# Patient Record
Sex: Female | Born: 1960 | Race: Black or African American | Hispanic: No | Marital: Single | Smoking: Never smoker
Health system: Southern US, Community
[De-identification: ages and names within clinical notes are randomized; demographics above are authoritative.]

## PROBLEM LIST (undated history)

## (undated) DIAGNOSIS — M1990 Unspecified osteoarthritis, unspecified site: Secondary | ICD-10-CM

## (undated) DIAGNOSIS — I8390 Asymptomatic varicose veins of unspecified lower extremity: Secondary | ICD-10-CM

## (undated) DIAGNOSIS — G43909 Migraine, unspecified, not intractable, without status migrainosus: Secondary | ICD-10-CM

## (undated) DIAGNOSIS — G8929 Other chronic pain: Secondary | ICD-10-CM

## (undated) DIAGNOSIS — N200 Calculus of kidney: Secondary | ICD-10-CM

## (undated) DIAGNOSIS — I10 Essential (primary) hypertension: Secondary | ICD-10-CM

## (undated) DIAGNOSIS — R51 Headache: Secondary | ICD-10-CM

## (undated) DIAGNOSIS — E785 Hyperlipidemia, unspecified: Secondary | ICD-10-CM

## (undated) DIAGNOSIS — K219 Gastro-esophageal reflux disease without esophagitis: Secondary | ICD-10-CM

## (undated) DIAGNOSIS — R519 Headache, unspecified: Secondary | ICD-10-CM

## (undated) DIAGNOSIS — T8859XA Other complications of anesthesia, initial encounter: Secondary | ICD-10-CM

## (undated) HISTORY — PX: OTHER SURGICAL HISTORY: SHX169

## (undated) HISTORY — PX: REPLACEMENT TOTAL KNEE: SUR1224

## (undated) HISTORY — PX: WRIST SURGERY: SHX841

## (undated) HISTORY — DX: Asymptomatic varicose veins of unspecified lower extremity: I83.90

## (undated) HISTORY — PX: LITHOTRIPSY: SUR834

## (undated) HISTORY — DX: Calculus of kidney: N20.0

## (undated) HISTORY — PX: TONSILLECTOMY: SUR1361

## (undated) HISTORY — DX: Unspecified osteoarthritis, unspecified site: M19.90

## (undated) HISTORY — PX: BREAST BIOPSY: SHX20

## (undated) HISTORY — DX: Essential (primary) hypertension: I10

## (undated) HISTORY — DX: Other complications of anesthesia, initial encounter: T88.59XA

## (undated) HISTORY — DX: Gastro-esophageal reflux disease without esophagitis: K21.9

## (undated) HISTORY — PX: ROTATOR CUFF REPAIR: SHX139

## (undated) HISTORY — DX: Hyperlipidemia, unspecified: E78.5

## (undated) HISTORY — PX: ABDOMINAL HYSTERECTOMY: SHX81

---

## 2007-10-18 ENCOUNTER — Emergency Department (HOSPITAL_COMMUNITY): Admission: EM | Admit: 2007-10-18 | Discharge: 2007-10-18 | Payer: Self-pay | Admitting: Emergency Medicine

## 2007-10-25 ENCOUNTER — Emergency Department (HOSPITAL_COMMUNITY): Admission: EM | Admit: 2007-10-25 | Discharge: 2007-10-25 | Payer: Self-pay | Admitting: Emergency Medicine

## 2008-02-05 ENCOUNTER — Emergency Department (HOSPITAL_COMMUNITY): Admission: EM | Admit: 2008-02-05 | Discharge: 2008-02-05 | Payer: Self-pay | Admitting: Emergency Medicine

## 2008-03-22 ENCOUNTER — Emergency Department (HOSPITAL_COMMUNITY): Admission: EM | Admit: 2008-03-22 | Discharge: 2008-03-22 | Payer: Self-pay | Admitting: Emergency Medicine

## 2008-03-31 ENCOUNTER — Ambulatory Visit: Payer: Self-pay | Admitting: Family Medicine

## 2008-03-31 DIAGNOSIS — E119 Type 2 diabetes mellitus without complications: Secondary | ICD-10-CM | POA: Insufficient documentation

## 2008-03-31 DIAGNOSIS — I10 Essential (primary) hypertension: Secondary | ICD-10-CM | POA: Insufficient documentation

## 2008-03-31 DIAGNOSIS — E785 Hyperlipidemia, unspecified: Secondary | ICD-10-CM | POA: Insufficient documentation

## 2008-04-03 ENCOUNTER — Ambulatory Visit: Payer: Self-pay | Admitting: Family Medicine

## 2008-04-03 LAB — CONVERTED CEMR LAB: Hgb A1c MFr Bld: 7.5 %

## 2008-05-05 ENCOUNTER — Ambulatory Visit: Payer: Self-pay | Admitting: Family Medicine

## 2008-06-12 ENCOUNTER — Ambulatory Visit: Payer: Self-pay | Admitting: Family Medicine

## 2008-07-03 ENCOUNTER — Ambulatory Visit: Payer: Self-pay | Admitting: Sports Medicine

## 2008-07-04 ENCOUNTER — Telehealth (INDEPENDENT_AMBULATORY_CARE_PROVIDER_SITE_OTHER): Payer: Self-pay | Admitting: *Deleted

## 2008-07-07 ENCOUNTER — Encounter (INDEPENDENT_AMBULATORY_CARE_PROVIDER_SITE_OTHER): Payer: Self-pay | Admitting: *Deleted

## 2008-07-09 ENCOUNTER — Encounter: Payer: Self-pay | Admitting: Family Medicine

## 2008-07-17 ENCOUNTER — Ambulatory Visit: Payer: Self-pay | Admitting: Family Medicine

## 2008-07-17 LAB — CONVERTED CEMR LAB: Hgb A1c MFr Bld: 7.7 %

## 2008-08-07 ENCOUNTER — Ambulatory Visit: Payer: Self-pay | Admitting: Sports Medicine

## 2008-08-07 DIAGNOSIS — M25569 Pain in unspecified knee: Secondary | ICD-10-CM | POA: Insufficient documentation

## 2008-08-08 ENCOUNTER — Ambulatory Visit: Payer: Self-pay | Admitting: Family Medicine

## 2008-08-08 ENCOUNTER — Encounter: Payer: Self-pay | Admitting: Family Medicine

## 2008-08-08 LAB — CONVERTED CEMR LAB
CO2: 23 meq/L (ref 19–32)
Glucose, Bld: 115 mg/dL — ABNORMAL HIGH (ref 70–99)
Potassium: 3.9 meq/L (ref 3.5–5.3)
RBC: 4.68 M/uL (ref 3.87–5.11)
Sodium: 142 meq/L (ref 135–145)
Total CHOL/HDL Ratio: 5.5
VLDL: 23 mg/dL (ref 0–40)
WBC: 9.6 10*3/uL (ref 4.0–10.5)

## 2008-08-12 ENCOUNTER — Encounter: Payer: Self-pay | Admitting: Family Medicine

## 2008-08-15 ENCOUNTER — Ambulatory Visit (HOSPITAL_COMMUNITY): Admission: RE | Admit: 2008-08-15 | Discharge: 2008-08-15 | Payer: Self-pay | Admitting: Family Medicine

## 2008-11-06 ENCOUNTER — Ambulatory Visit: Payer: Self-pay | Admitting: Family Medicine

## 2008-12-15 ENCOUNTER — Ambulatory Visit: Payer: Self-pay | Admitting: Family Medicine

## 2008-12-15 LAB — CONVERTED CEMR LAB: Hgb A1c MFr Bld: 7.5 %

## 2009-02-02 ENCOUNTER — Ambulatory Visit: Payer: Self-pay | Admitting: Family Medicine

## 2009-02-02 ENCOUNTER — Encounter: Payer: Self-pay | Admitting: Family Medicine

## 2009-02-02 ENCOUNTER — Ambulatory Visit (HOSPITAL_COMMUNITY): Admission: RE | Admit: 2009-02-02 | Discharge: 2009-02-02 | Payer: Self-pay | Admitting: Family Medicine

## 2009-02-02 LAB — CONVERTED CEMR LAB
ALT: 8 units/L (ref 0–35)
Alkaline Phosphatase: 74 units/L (ref 39–117)
Sodium: 147 meq/L — ABNORMAL HIGH (ref 135–145)
Total Bilirubin: 0.4 mg/dL (ref 0.3–1.2)
Total Protein: 7.2 g/dL (ref 6.0–8.3)

## 2009-02-05 ENCOUNTER — Encounter: Payer: Self-pay | Admitting: Family Medicine

## 2009-02-05 ENCOUNTER — Ambulatory Visit (HOSPITAL_COMMUNITY): Admission: RE | Admit: 2009-02-05 | Discharge: 2009-02-05 | Payer: Self-pay | Admitting: Family Medicine

## 2009-02-06 ENCOUNTER — Encounter: Payer: Self-pay | Admitting: Family Medicine

## 2009-02-06 ENCOUNTER — Telehealth: Payer: Self-pay | Admitting: Family Medicine

## 2009-02-11 ENCOUNTER — Ambulatory Visit: Payer: Self-pay | Admitting: Family Medicine

## 2009-02-11 DIAGNOSIS — I2789 Other specified pulmonary heart diseases: Secondary | ICD-10-CM | POA: Insufficient documentation

## 2009-03-03 ENCOUNTER — Encounter: Payer: Self-pay | Admitting: Family Medicine

## 2009-03-31 ENCOUNTER — Encounter: Payer: Self-pay | Admitting: Family Medicine

## 2009-03-31 ENCOUNTER — Ambulatory Visit: Payer: Self-pay | Admitting: Family Medicine

## 2009-03-31 LAB — CONVERTED CEMR LAB
Alkaline Phosphatase: 98 units/L (ref 39–117)
Creatinine, Ser: 0.75 mg/dL (ref 0.40–1.20)
Glucose, Bld: 310 mg/dL — ABNORMAL HIGH (ref 70–99)
MCHC: 31.8 g/dL (ref 30.0–36.0)
MCV: 83.2 fL (ref 78.0–100.0)
Platelets: 363 10*3/uL (ref 150–400)
RBC: 4.65 M/uL (ref 3.87–5.11)
Sodium: 140 meq/L (ref 135–145)
Total Bilirubin: 0.4 mg/dL (ref 0.3–1.2)
Total Protein: 6.9 g/dL (ref 6.0–8.3)

## 2009-04-05 ENCOUNTER — Encounter: Payer: Self-pay | Admitting: Family Medicine

## 2009-05-02 ENCOUNTER — Emergency Department (HOSPITAL_COMMUNITY): Admission: EM | Admit: 2009-05-02 | Discharge: 2009-05-03 | Payer: Self-pay | Admitting: Emergency Medicine

## 2009-05-04 ENCOUNTER — Encounter: Payer: Self-pay | Admitting: Family Medicine

## 2009-05-04 ENCOUNTER — Ambulatory Visit: Payer: Self-pay | Admitting: Family Medicine

## 2009-05-04 LAB — CONVERTED CEMR LAB
ALT: 29 units/L (ref 0–35)
AST: 28 units/L (ref 0–37)
CO2: 26 meq/L (ref 19–32)
Chloride: 102 meq/L (ref 96–112)
Glucose, Bld: 359 mg/dL — ABNORMAL HIGH (ref 70–99)
HDL: 29 mg/dL — ABNORMAL LOW (ref >39)
LDL Cholesterol: 45 mg/dL (ref 0–99)
Sodium: 140 meq/L (ref 135–145)
Total Bilirubin: 0.6 mg/dL (ref 0.3–1.2)
Total CHOL/HDL Ratio: 3.4

## 2009-05-27 ENCOUNTER — Encounter: Admission: RE | Admit: 2009-05-27 | Discharge: 2009-05-27 | Payer: Self-pay | Admitting: Family Medicine

## 2009-06-02 ENCOUNTER — Ambulatory Visit: Payer: Self-pay | Admitting: Family Medicine

## 2009-09-04 ENCOUNTER — Ambulatory Visit (HOSPITAL_COMMUNITY): Admission: RE | Admit: 2009-09-04 | Discharge: 2009-09-04 | Payer: Self-pay | Admitting: Family Medicine

## 2009-09-23 ENCOUNTER — Emergency Department (HOSPITAL_COMMUNITY): Admission: EM | Admit: 2009-09-23 | Discharge: 2009-09-23 | Payer: Self-pay | Admitting: Emergency Medicine

## 2009-09-25 ENCOUNTER — Ambulatory Visit: Payer: Self-pay | Admitting: Family Medicine

## 2009-09-25 DIAGNOSIS — N200 Calculus of kidney: Secondary | ICD-10-CM | POA: Insufficient documentation

## 2009-09-25 LAB — CONVERTED CEMR LAB
Glucose, Urine, Semiquant: NEGATIVE
Specific Gravity, Urine: 1.025
pH: 6.5

## 2009-10-01 ENCOUNTER — Ambulatory Visit: Payer: Self-pay | Admitting: Family Medicine

## 2009-10-01 ENCOUNTER — Encounter: Payer: Self-pay | Admitting: Family Medicine

## 2009-10-01 DIAGNOSIS — E876 Hypokalemia: Secondary | ICD-10-CM | POA: Insufficient documentation

## 2009-10-01 LAB — CONVERTED CEMR LAB
BUN: 14 mg/dL (ref 6–23)
CO2: 23 meq/L (ref 19–32)
Chloride: 103 meq/L (ref 96–112)
Glucose, Bld: 199 mg/dL — ABNORMAL HIGH (ref 70–99)
Potassium: 3.5 meq/L (ref 3.5–5.3)

## 2009-10-02 ENCOUNTER — Encounter: Payer: Self-pay | Admitting: Family Medicine

## 2009-11-05 ENCOUNTER — Observation Stay (HOSPITAL_COMMUNITY): Admission: EM | Admit: 2009-11-05 | Discharge: 2009-11-05 | Payer: Self-pay | Admitting: Emergency Medicine

## 2009-11-11 ENCOUNTER — Ambulatory Visit: Payer: Self-pay | Admitting: Family Medicine

## 2009-11-11 ENCOUNTER — Encounter: Payer: Self-pay | Admitting: Family Medicine

## 2009-11-11 LAB — CONVERTED CEMR LAB
CO2: 25 meq/L (ref 19–32)
Calcium: 8.8 mg/dL (ref 8.4–10.5)
Glucose, Bld: 241 mg/dL — ABNORMAL HIGH (ref 70–99)
Sodium: 141 meq/L (ref 135–145)

## 2009-12-04 ENCOUNTER — Encounter: Payer: Self-pay | Admitting: Family Medicine

## 2009-12-18 ENCOUNTER — Ambulatory Visit: Payer: Self-pay | Admitting: Family Medicine

## 2010-01-12 ENCOUNTER — Emergency Department (HOSPITAL_COMMUNITY)
Admission: EM | Admit: 2010-01-12 | Discharge: 2010-01-12 | Payer: Self-pay | Source: Home / Self Care | Admitting: Emergency Medicine

## 2010-02-05 ENCOUNTER — Ambulatory Visit: Admission: RE | Admit: 2010-02-05 | Discharge: 2010-02-05 | Payer: Self-pay | Source: Home / Self Care

## 2010-02-05 DIAGNOSIS — J069 Acute upper respiratory infection, unspecified: Secondary | ICD-10-CM | POA: Insufficient documentation

## 2010-02-05 DIAGNOSIS — E1140 Type 2 diabetes mellitus with diabetic neuropathy, unspecified: Secondary | ICD-10-CM | POA: Insufficient documentation

## 2010-03-02 NOTE — Assessment & Plan Note (Signed)
Summary: kidney stones,tcb   Vital Signs:  Patient profile:   50 year old female Height:      68 inches Weight:      293 pounds BMI:     44.71 BSA:     2.41 Temp:     98.3 degrees F Pulse rate:   74 / minute BP sitting:   150 / 115  Vitals Entered By: Jone Baseman CMA (September 25, 2009 10:45 AM) CC: Kidney Stones Is Patient Diabetic? No Pain Assessment Patient in pain? yes     Location: back Intensity: 10   Primary Care Provider:  Angelena Sole MD  CC:  Kidney Stones.  History of Present Illness: 1. Kidney Stones:  Pt see in ED two days ago because of severe flank pain.  Diagnosed with right sided kidney stone and UTI.  She was given some pain meds, anti-nausea medicine, and an antibiotic and sent home.  She is still having flank pain.  She has not been taking any of the medicines because she doesn't like the way it makes her feel.  The pain is in the right flank.  It is rated a 10/10.  Pain medicines make it feel better.  She has been drinking lots of water.  She has been producing a lot of urine.    PMHx: Hx of recurrent kidney stones.  Has been seen by Urology before and has require lithotripsy in the past.  She claims that she had around 30 stones before and she has never passed a stone.  ROS: endorses dysuria and n/v.  Denies fevers, chills, hematuria   2. HTN:  She is taking her medicines as prescribed.  She did not take her BP pills this morning.  She is in pain.  Needs refills on her medications.  ROS: denies chest pain, vision changes, shortness of breath  Habits & Providers  Alcohol-Tobacco-Diet     Tobacco Status: never  Current Medications (verified): 1)  Toprol Xl 200 Mg Xr24h-Tab (Metoprolol Succinate) .Marland Kitchen.. 1 Tablet By Mouth Once Daily 2)  Accupril 40 Mg Tabs (Quinapril Hcl) .Marland Kitchen.. 1 Tablet 1 Time Per Day 3)  Metformin Hcl 1000 Mg Tabs (Metformin Hcl) .... One Two Times A Day 4)  Klor-Con 20 Meq Pack (Potassium Chloride) .Marland Kitchen.. 1 Tab By Mouth Daily 5)   Tramadol Hcl 50 Mg Tabs (Tramadol Hcl) .... Take 1 Tab Every 6 Hours As Needed For Pain 6)  Ibuprofen 600 Mg Tabs (Ibuprofen) .Marland Kitchen.. 1 Tab By Mouth At The Onset of A Headache. 7)  Zomig 2.5 Mg Tabs (Zolmitriptan) .... Take 1 Tab By Mouth Daily As Needed For Migraine 8)  Furosemide 40 Mg Tabs (Furosemide) .Marland Kitchen.. 1 Tab By Mouth Two Times A Day For Lower Extremity Edema 9)  Glimepiride 2 Mg Tabs (Glimepiride) .... One Tab in Am 10)  V-4 High Compression Hose  Misc (Elastic Bandages & Supports) .... Use As Directed 11)  [name] 32 Mg Tabs (Candesartan Cilexetil) .Marland Kitchen.. 1 Tab By Mouth Daily 12)  Crestor 10 Mg Tabs (Rosuvastatin Calcium) .Marland Kitchen.. 1 Tab By Mouth Daily  Allergies: No Known Drug Allergies  Past History:  Past Medical History: Reviewed history from 02/11/2009 and no changes required. Diabetes mellitus, type II Hyperlipidemia Hypertension Right knee pain Kidney stones Lower extremity swelling  Social History: Reviewed history from 06/02/2009 and no changes required. Moved to [place] from [facility] 09/2007. 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  Enjoys  reading and bowling.   Physical Exam  General:  VS reviewed. alert, well-developed, well-nourished, and well-hydrated.  not ill or uncomfortable appearing, in NAD Mouth:  MMM Lungs:  normal respiratory effort, normal breath sounds, no fremitus, no crackles, and no wheezes.   Heart:  Normal rate and regular rhythm. S1 and S2 normal without gallop, murmur, click, rub or other extra sounds. Abdomen:  soft, non-tender, normal bowel sounds, no distention, no guarding, and no rebound tenderness.   Msk:  Right flank is moderately tender to palpation Additional Exam:  CT abdomen / pelvis:  4mm obstructing right ureteral stone UA: + blood, small LE, 7-10WBCs, TNTC RBCs   Impression & Recommendations:  Problem # 1:  NEPHROLITHIASIS, RECURRENT (ICD-592.0) Assessment  New Obstructing stone in right ureter.  She claims that she has never passed a stone.  Will refer to Urology for evaluation and management.  She is not taking any pain meds. Orders: Urology Referral (Urology) [facility]- Est  Level 4 ([address])  Problem # 2:  HYPERTENSION (ICD-401.9) Assessment: Unchanged  Did not take her medicines this morning plus she is in pain.  Will not make any changes today. The following medications were removed from the medication list:    [name] 32 Mg Tabs (Candesartan cilexetil) .Marland Kitchen... 1 tablet by mouth once daily Her updated medication list for this problem includes:    Toprol Xl 200 Mg Xr24h-tab (Metoprolol succinate) .Marland Kitchen... 1 tablet by mouth once daily    Accupril 40 Mg Tabs (Quinapril hcl) .Marland Kitchen... 1 tablet 1 time per day    Furosemide 40 Mg Tabs (Furosemide) .Marland Kitchen... 1 tab by mouth two times a day for lower extremity edema    [name] 32 Mg Tabs (Candesartan cilexetil) .Marland Kitchen... 1 tab by mouth daily  Orders: [facility]- Est  Level 4 ([address])  Complete Medication List: 1)  Toprol Xl 200 Mg Xr24h-tab (Metoprolol succinate) .Marland Kitchen.. 1 tablet by mouth once daily 2)  Accupril 40 Mg Tabs (Quinapril hcl) .Marland Kitchen.. 1 tablet 1 time per day 3)  Metformin Hcl 1000 Mg Tabs (Metformin hcl) .... One two times a day 4)  Klor-con 20 Meq Pack (Potassium chloride) .Marland Kitchen.. 1 tab by mouth daily 5)  Tramadol Hcl 50 Mg Tabs (Tramadol hcl) .... Take 1 tab every 6 hours as needed for pain 6)  Ibuprofen 600 Mg Tabs (Ibuprofen) .Marland Kitchen.. 1 tab by mouth at the onset of a headache. 7)  Zomig 2.5 Mg Tabs (Zolmitriptan) .... Take 1 tab by mouth daily as needed for migraine 8)  Furosemide 40 Mg Tabs (Furosemide) .Marland Kitchen.. 1 tab by mouth two times a day for lower extremity edema 9)  Glimepiride 2 Mg Tabs (Glimepiride) .... One tab in am 10)  V-4 High Compression Hose Misc (Elastic bandages & supports) .... Use as directed 11)  [name] 32 Mg Tabs (Candesartan cilexetil) .Marland Kitchen.. 1 tab by mouth daily 12)  Crestor 10 Mg Tabs (Rosuvastatin  calcium) .Marland Kitchen.. 1 tab by mouth daily  Other Orders: Urinalysis-[facility] (00000)  Patient Instructions: 1)  We will get you set up to see a Urologist 2)  Take the pain medicines if you need to 3)  Please schedule a follow up appointment with me in 1 week Prescriptions: GLIMEPIRIDE 2 MG TABS (GLIMEPIRIDE) one tab in AM  #90 x 3   Entered and Authorized by:   Angelena Sole MD   Signed by:   Angelena Sole MD on 09/25/2009   Method used:   Print then Give to Patient   RxID:  [id number] KLOR-CON 20 MEQ PACK (POTASSIUM CHLORIDE) 1 tab by mouth daily  #90 x 3   Entered and Authorized by:   Angelena Sole MD   Signed by:   Angelena Sole MD on 09/25/2009   Method used:   Print then Give to Patient   RxID:   [id number] METFORMIN HCL 1000 MG TABS (METFORMIN HCL) one two times a day  #180 x 6   Entered and Authorized by:   Angelena Sole MD   Signed by:   Angelena Sole MD on 09/25/2009   Method used:   Print then Give to Patient   RxID:   [id number] ACCUPRIL 40 MG TABS (QUINAPRIL HCL) 1 tablet 1 time per day  #90 x 3   Entered and Authorized by:   Angelena Sole MD   Signed by:   Angelena Sole MD on 09/25/2009   Method used:   Print then Give to Patient   RxID:   [id number] TOPROL XL 200 MG XR24H-TAB (METOPROLOL SUCCINATE) 1 tablet by mouth once daily  #90 x 3   Entered and Authorized by:   Angelena Sole MD   Signed by:   Angelena Sole MD on 09/25/2009   Method used:   Print then Give to Patient   RxID:   [id number]   Laboratory Results   Urine Tests  Date/Time Received: September 25, 2009 10:46 AM  Date/Time Reported: September 25, 2009 11:16 AM   Routine Urinalysis   Color: yellow Appearance: slightly Cloudy Glucose: negative   (Normal Range: Negative) Bilirubin: negative   (Normal Range: Negative) Ketone: negative   (Normal Range: Negative) Spec. Gravity: 1.025   (Normal Range: 1.003-1.035) Blood: moderate   (Normal Range: Negative) pH:  6.5   (Normal Range: 5.0-8.0) Protein: 100   (Normal Range: Negative) Urobilinogen: 0.2   (Normal Range: 0-1) Nitrite: negative   (Normal Range: Negative) Leukocyte Esterace: moderate   (Normal Range: Negative)  Urine Microscopic WBC/HPF: loaded RBC/HPF: >20 Bacteria/HPF: 3+ Epithelial/HPF: 10-20 Yeast/HPF: mod with hyphae    Comments: ...............test performed by......Marland KitchenBonnie A. [place], MLS (ASCP)cm

## 2010-03-02 NOTE — Assessment & Plan Note (Signed)
Summary: f/u eo   Vital Signs:  Patient profile:   50 year old female Height:      68 inches Weight:      299.06 pounds BMI:     45.64 BSA:     2.43 Temp:     98.1 degrees F Pulse rate:   81 / minute BP sitting:   150 / 107  Vitals Entered By: Jone Baseman CMA (November 11, 2009 8:55 AM) CC: f/u kidney stones Is Patient Diabetic? Yes Did you bring your meter with you today? No Pain Assessment Patient in pain? yes     Location: flank Intensity: 7   Primary Care Provider:  Angelena Sole MD  CC:  f/u kidney stones.  History of Present Illness: 1. F/U Kidney stones:  Pt has had kidney stones for about 1 month.  She wasn't able to afford the Urology referral.  She went to the ED last week becaus of severe pain and diagnosed with bilateral kidney stones.  She is still having bad pain.  Pain is rated a 7/10.  Located more on the right flank but also goes to the left.    ROS: denies hematuria, fever  2. HTN: she is taking her medicines as prescribed.  She thinks that it is elevated because she is in pain.  Would not want to think about making any medication adjustments until her kidney stones have been figured out.  ROS: denies chest pain, shortness of breath    Habits & Providers  Alcohol-Tobacco-Diet     Tobacco Status: never  Current Medications (verified): 1)  Toprol Xl 200 Mg Xr24h-Tab (Metoprolol Succinate) .Marland Kitchen.. 1 Tablet By Mouth Once Daily 2)  Accupril 40 Mg Tabs (Quinapril Hcl) .Marland Kitchen.. 1 Tablet 1 Time Per Day 3)  Metformin Hcl 1000 Mg Tabs (Metformin Hcl) .... One Two Times A Day 4)  Klor-Con 20 Meq Pack (Potassium Chloride) .Marland Kitchen.. 1 Tab By Mouth Daily 5)  Ibuprofen 600 Mg Tabs (Ibuprofen) .Marland Kitchen.. 1 Tab By Mouth At The Onset of A Headache. 6)  Zomig 2.5 Mg Tabs (Zolmitriptan) .... Take 1 Tab By Mouth Daily As Needed For Migraine 7)  Glimepiride 2 Mg Tabs (Glimepiride) .... One Tab in Am 8)  [name] 32 Mg Tabs (Candesartan Cilexetil) .Marland Kitchen.. 1 Tab By Mouth Daily 9)   Crestor 10 Mg Tabs (Rosuvastatin Calcium) .Marland Kitchen.. 1 Tab By Mouth Daily 10)  Hydrochlorothiazide 25 Mg Tabs (Hydrochlorothiazide) .Marland Kitchen.. 1 Tab By Mouth Daily  Allergies: No Known Drug Allergies  Social History: Reviewed history from 06/02/2009 and no changes required. Moved to [place] from [facility] 09/2007. Has not worked in about 1 year.  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  Enjoys reading and bowling.   Physical Exam  General:  VS reviewed. alert, well-developed, well-nourished, and well-hydrated.  not ill or uncomfortable appearing, in NAD Lungs:  normal respiratory effort, normal breath sounds, no fremitus, no crackles, and no wheezes.   Heart:  Normal rate and regular rhythm. S1 and S2 normal without gallop, murmur, click, rub or other extra sounds. Abdomen:  soft, non-tender, normal bowel sounds, and no distention.   Msk:  minimal flank pain to palpation Extremities:  no lower extremity edema Psych:  not anxious appearing.   Additional Exam:  ED note reviewed:  bilateral nephrolithiasis according to [facility].  Discharged with Vicodin   Impression & Recommendations:  Problem # 1:  NEPHROLITHIASIS, RECURRENT (ICD-592.0) Assessment Unchanged Persistent.  She needs Urology evaluation and treatment.  Will try and get her  into [facility]. Orders: Urology Referral (Urology) [facility]- Est Level  3 [phone])  Problem # 2:  HYPERTENSION (ICD-401.9) Assessment: Unchanged Not at goal but patient is in pain.  Will not make any adjustments until kidney stone is figured out. Her updated medication list for this problem includes:    Toprol Xl 200 Mg Xr24h-tab (Metoprolol succinate) .Marland Kitchen... 1 tablet by mouth once daily    Accupril 40 Mg Tabs (Quinapril hcl) .Marland Kitchen... 1 tablet 1 time per day    [name] 32 Mg Tabs (Candesartan cilexetil) .Marland Kitchen... 1 tab by mouth daily    Hydrochlorothiazide 25 Mg Tabs (Hydrochlorothiazide) .Marland Kitchen... 1 tab by mouth daily Assessment: Unchanged  Complete Medication List: 1)   Toprol Xl 200 Mg Xr24h-tab (Metoprolol succinate) .Marland Kitchen.. 1 tablet by mouth once daily 2)  Accupril 40 Mg Tabs (Quinapril hcl) .Marland Kitchen.. 1 tablet 1 time per day 3)  Metformin Hcl 1000 Mg Tabs (Metformin hcl) .... One two times a day 4)  Klor-con 20 Meq Pack (Potassium chloride) .Marland Kitchen.. 1 tab by mouth daily 5)  Ibuprofen 600 Mg Tabs (Ibuprofen) .Marland Kitchen.. 1 tab by mouth at the onset of a headache. 6)  Zomig 2.5 Mg Tabs (Zolmitriptan) .... Take 1 tab by mouth daily as needed for migraine 7)  Glimepiride 2 Mg Tabs (Glimepiride) .... One tab in am 8)  [name] 32 Mg Tabs (Candesartan cilexetil) .Marland Kitchen.. 1 tab by mouth daily 9)  Crestor 10 Mg Tabs (Rosuvastatin calcium) .Marland Kitchen.. 1 tab by mouth daily 10)  Hydrochlorothiazide 25 Mg Tabs (Hydrochlorothiazide) .Marland Kitchen.. 1 tab by mouth daily  Other Orders: A1C-[facility] (60737) Basic Met-[facility] [phone])  Patient Instructions: 1)  We will continue to try and get you seen by a Urologist 2)  I am going to recheck your potassium today to see if it is still low 3)  Please schedule an appointment in 3 weeks to make sure that you have been seen  Laboratory Results   Blood Tests   Date/Time Received: November 11, 2009 8:48 AM  Date/Time Reported: November 11, 2009 9:03 AM   HGBA1C: 9.5%   (Normal Range: Non-Diabetic - 3-6%   Control Diabetic - 6-8%)  Comments: ...............test performed by......Marland KitchenBonnie A. [place], MLS (ASCP)cm

## 2010-03-02 NOTE — Assessment & Plan Note (Signed)
Summary: high sugar,tcb   Vital Signs:  Patient profile:   50 year old female Height:      68 inches Weight:      302 pounds BMI:     46.08 Temp:     97.9 degrees F oral Pulse rate:   88 / minute BP sitting:   150 / 106  (right arm) Cuff size:   large  Vitals Entered By: Tessie Fass CMA (May 04, 2009 11:23 AM) CC: elevated blood sugar Is Patient Diabetic? Yes Pain Assessment Patient in pain? no        Primary Care Provider:  Angelena Sole MD  CC:  elevated blood sugar.  History of Present Illness: Has been running very high blood sugars with polyuria and polydipsia.  Remains on metformin but has been out for a few day.  Has not taken blood pressure medicaions today.    Seen in ER 2 days ago and blood sugar was 490. She has gained weight, eats a lot of fried foods.  She denies any signs or symptoms of infection.  Habits & Providers  Alcohol-Tobacco-Diet     Tobacco Status: never  Allergies: No Known Drug Allergies   Impression & Recommendations:  Problem # 1:  DIABETES MELLITUS, TYPE II (ICD-250.00) A1C 12.2 % was below 8 last checked. Get back on Metformin, add glimepiride 2 mg, discusses signs of low blood sugar, referred to Diabetic and nutrition management. Her updated medication list for this problem includes:    Accupril 40 Mg Tabs (Quinapril hcl) .Marland Kitchen... 1 tablet 1 time per day    [name] 32 Mg Tabs (Candesartan cilexetil) .Marland Kitchen... 1 tablet by mouth once daily    Metformin Hcl 1000 Mg Tabs (Metformin hcl) ..... One two times a day    Glimepiride 2 Mg Tabs (Glimepiride) ..... One tab in am  Orders: Comp Met-[facility] [phone]) Lipid-[facility] ([id number]) A1C-[facility] (44010) [facility]- Est Level  3 (27253)  Problem # 2:  OBESITY (ICD-278.00)  Gained almost 20 pounds in past 6 months, BMI is 46, recomneded walking 1 hour daily, increase in fruits and veges, baked lean meat, no sugar or calories in drinks.  Follow up with primary MD in 1 month  Orders: [facility]- Est Level   3 (66440)  Problem # 3:  HYPERTENSION (ICD-401.9) Did not take BP meds today and on 5. Her updated medication list for this problem includes:    Toprol Xl 200 Mg Xr24h-tab (Metoprolol succinate) .Marland Kitchen... 1 tablet by mouth once daily    Accupril 40 Mg Tabs (Quinapril hcl) .Marland Kitchen... 1 tablet 1 time per day    [name] 32 Mg Tabs (Candesartan cilexetil) .Marland Kitchen... 1 tablet by mouth once daily    Amlodipine Besylate 10 Mg Tabs (Amlodipine besylate) .Marland Kitchen... 1 tablet by mouth once daily    Furosemide 40 Mg Tabs (Furosemide) .Marland Kitchen... 1 tab by mouth two times a day for lower extremity edema  Orders: [facility]- Est Level  3 (34742)  Complete Medication List: 1)  Toprol Xl 200 Mg Xr24h-tab (Metoprolol succinate) .Marland Kitchen.. 1 tablet by mouth once daily 2)  Accupril 40 Mg Tabs (Quinapril hcl) .Marland Kitchen.. 1 tablet 1 time per day 3)  [name] 32 Mg Tabs (Candesartan cilexetil) .Marland Kitchen.. 1 tablet by mouth once daily 4)  Amlodipine Besylate 10 Mg Tabs (Amlodipine besylate) .Marland Kitchen.. 1 tablet by mouth once daily 5)  Crestor 10 Mg Tabs (Rosuvastatin calcium) .Marland Kitchen.. 1 tablet by mouth once daily 6)  Metformin Hcl 1000 Mg Tabs (Metformin hcl) .... One two times  a day 7)  Klor-con M20 20 Meq Cr-tabs (Potassium chloride crys cr) .Marland Kitchen.. 1 tablet by mouth once daily 8)  Tramadol Hcl 50 Mg Tabs (Tramadol hcl) .... Take 1 tab every 6 hours as needed for pain 9)  Ibuprofen 600 Mg Tabs (Ibuprofen) .Marland Kitchen.. 1 tab by mouth at the onset of a headache. 10)  Zomig 2.5 Mg Tabs (Zolmitriptan) .... Take 1 tab by mouth daily as needed for migraine 11)  Furosemide 40 Mg Tabs (Furosemide) .Marland Kitchen.. 1 tab by mouth two times a day for lower extremity edema 12)  Glimepiride 2 Mg Tabs (Glimepiride) .... One tab in am  Patient Instructions: 1)  Diabetic educators will call and schedule your classes 2)  Meformin 1000 mg two times a day  3)  Add Glimpride 2 mg, take in morning 4)  Eat 3 meals a day, well balanced 5)  Do not drinik calories 6)   Increase veges and 2 pieces of fresh fruit  daily 7)  Walk up to one hour a day. 8)  Please schedule a follow-up appointment in 1 month with Dr. Lelon Perla.  Prescriptions: GLIMEPIRIDE 2 MG TABS (GLIMEPIRIDE) one tab in AM  #30 x 6   Entered and Authorized by:   Luretha Murphy NP   Signed by:   Luretha Murphy NP on 05/04/2009   Method used:   Electronically to        [facility] [address] (retail)       [phone]       [place], [place]  [address]       Ph: [phone]       Fax: [phone]   RxID:   [id number] METFORMIN HCL 1000 MG TABS (METFORMIN HCL) one two times a day  #60 x 6   Entered and Authorized by:   Luretha Murphy NP   Signed by:   Luretha Murphy NP on 05/04/2009   Method used:   Electronically to        [facility] [address] (retail)       [phone]       [place], [place]  [address]       Ph: [phone]       Fax: [phone]   RxID:   [id number]   Laboratory Results   Blood Tests   Date/Time Received: May 04, 2009 11:50 AM  Date/Time Reported: May 04, 2009 12:11 PM   HGBA1C: 12.2%   (Normal Range: Non-Diabetic - 3-6%   Control Diabetic - 6-8%)  Comments: ...........test performed by...........Marland KitchenTerese Door, CMA

## 2010-03-02 NOTE — Assessment & Plan Note (Signed)
Summary: feet swelling/purple,df   Vital Signs:  Patient profile:   50 year old female Height:      68 inches Weight:      300.4 pounds BMI:     45.84 O2 Sat:      98 % on Room air Temp:     97.9 degrees F oral Pulse rate:   87 / minute BP sitting:   146 / 82  (left arm) Cuff size:   regular  Vitals Entered By: Garen Grams LPN (March 31, 1608 3:27 PM)  O2 Flow:  Room air CC: feet/ankles swollen and painful Is Patient Diabetic? Yes Did you bring your meter with you today? No Pain Assessment Patient in pain? yes     Location: feet/ankles   Primary Care Provider:  Angelena Sole MD  CC:  feet/ankles swollen and painful.  History of Present Illness: 50 yo female w/c/o of worsening lower extermity edema for the past 3 weeks.  Swelling waxes and wanes.  Currently on Lasix 40mg  by mouth two times a day, taking as directed.  Lasix helped some, but now swelling has returned and worsened.  Denies SOB, chest pain, orthopnea.   Habits & Providers  Alcohol-Tobacco-Diet     Tobacco Status: never  Current Medications (verified): 1)  Toprol Xl 200 Mg Xr24h-Tab (Metoprolol Succinate) .Marland Kitchen.. 1 Tablet By Mouth Once Daily 2)  Accupril 40 Mg Tabs (Quinapril Hcl) .Marland Kitchen.. 1 Tablet 1 Time Per Day 3)  [name] 32 Mg Tabs (Candesartan Cilexetil) .Marland Kitchen.. 1 Tablet By Mouth Once Daily 4)  Amlodipine Besylate 10 Mg Tabs (Amlodipine Besylate) .Marland Kitchen.. 1 Tablet By Mouth Once Daily 5)  Crestor 10 Mg Tabs (Rosuvastatin Calcium) .Marland Kitchen.. 1 Tablet By Mouth Once Daily 6)  Metformin Hcl 500 Mg Tabs (Metformin Hcl) .... 2 Tablets By Mouth Two Times A Day 7)  Klor-Con M20 20 Meq Cr-Tabs (Potassium Chloride Crys Cr) .Marland Kitchen.. 1 Tablet By Mouth Once Daily 8)  Tramadol Hcl 50 Mg Tabs (Tramadol Hcl) .... Take 1 Tab Every 6 Hours As Needed For Pain 9)  Ibuprofen 600 Mg Tabs (Ibuprofen) .Marland Kitchen.. 1 Tab By Mouth At The Onset of A Headache. 10)  Zomig 2.5 Mg Tabs (Zolmitriptan) .... Take 1 Tab By Mouth Daily As Needed For Migraine 11)   Furosemide 40 Mg Tabs (Furosemide) .Marland Kitchen.. 1 Tab By Mouth Two Times A Day For Lower Extremity Edema  Allergies (verified): No Known Drug Allergies  Past History:  Past Medical History: Last updated: 02/11/2009 Diabetes mellitus, type II Hyperlipidemia Hypertension Right knee pain Kidney stones Lower extremity swelling  Past Surgical History: Last updated: 12/15/2008 Hysterectomy-1995 Tubal ligation-1992 R knee X-ray Jan. 2010 - Well corticated fracture fragment along the medial tibial spine U/S R knee Oct 2010 - ACL tear  Family History: Last updated: 03/31/2008 Family History Diabetes 1st degree relative- mother age 35 Family History Hypertension- mother age 14 Family History Lung cancer - age 56 Prostate cancer- father age 104  Social History: Last updated: 12/15/2008 Moved to [place] from [place] 09/2007. Works as a Conservation officer, nature at [employer].  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  Enjoys reading and bowling.   Risk Factors: Exercise: no (03/31/2008)  Risk Factors: Smoking Status: never (03/31/2009)  Review of Systems       The patient complains of peripheral edema.  The patient denies weight gain, chest pain, syncope, and dyspnea on exertion.    Physical Exam  General:  VS reviewed. alert, well-developed, well-nourished, and well-hydrated.  not ill or uncomfortable  appearing, in NAD Neck:  full ROM, no carotid bruits, +JVD Lungs:  normal respiratory effort, normal breath sounds, no fremitus, no crackles, and no wheezes.   Heart:  Normal rate and regular rhythm. S1 and S2 normal without gallop, murmur, click, rub or other extra sounds. Pulses:  R dorsalis pedis normal and L dorsalis pedis normal.   Extremities:  2+ LE pitting edema bilaterally   Impression & Recommendations:  Problem # 1:  LEG EDEMA, BILATERAL (ICD-782.3) Assessment Deteriorated Will get labs today, eveluate Hgb, kidney function, electrolytes as these could all be causes of edema.  Could  increase lasix dose to diuresis more aggresively, but this would only result in short term relief.  Echo done 01/11 showed EF of 65% so prob not due to CHF.  If work up continues to be neg, prob due to venous insuff.  Will stop amlodipine and ibuprofen as these can increase edema.  to f/u with PCP in 2-3 weeks if no improvement.    Her updated medication list for this problem includes:    Furosemide 40 Mg Tabs (Furosemide) .Marland Kitchen... 1 tab by mouth two times a day for lower extremity edema  Orders: Comp Met-[facility] ([address]657-84696) CBC-[facility] ([address]28) [facility]- Est Level  3 (41324)  Complete Medication List: 1)  Toprol Xl 200 Mg Xr24h-tab (Metoprolol succinate) .Marland Kitchen.. 1 tablet by mouth once daily 2)  Accupril 40 Mg Tabs (Quinapril hcl) .Marland Kitchen.. 1 tablet 1 time per day 3)  [name] 32 Mg Tabs (Candesartan cilexetil) .Marland Kitchen.. 1 tablet by mouth once daily 4)  Amlodipine Besylate 10 Mg Tabs (Amlodipine besylate) .Marland Kitchen.. 1 tablet by mouth once daily 5)  Crestor 10 Mg Tabs (Rosuvastatin calcium) .Marland Kitchen.. 1 tablet by mouth once daily 6)  Metformin Hcl 500 Mg Tabs (Metformin hcl) .... 2 tablets by mouth two times a day 7)  Klor-con M20 20 Meq Cr-tabs (Potassium chloride crys cr) .Marland Kitchen.. 1 tablet by mouth once daily 8)  Tramadol Hcl 50 Mg Tabs (Tramadol hcl) .... Take 1 tab every 6 hours as needed for pain 9)  Ibuprofen 600 Mg Tabs (Ibuprofen) .Marland Kitchen.. 1 tab by mouth at the onset of a headache. 10)  Zomig 2.5 Mg Tabs (Zolmitriptan) .... Take 1 tab by mouth daily as needed for migraine 11)  Furosemide 40 Mg Tabs (Furosemide) .Marland Kitchen.. 1 tab by mouth two times a day for lower extremity edema  Patient Instructions: 1)  Nice to meet you today! 2)  STOP the Amlodipine and Ibuprofen, this can cause swelling.   3)  Please check your blood pressure at home and call me at [phone] with your readings.   4)  I will call you if any of your blood work is abnormal.

## 2010-03-02 NOTE — Assessment & Plan Note (Signed)
Summary: f/u eo   Vital Signs:  Patient profile:   50 year old female Height:      68 inches Weight:      298.25 pounds BMI:     45.51 BSA:     2.42 Temp:     98.5 degrees F Pulse rate:   80 / minute BP sitting:   150 / 102  Vitals Entered By: Jone Baseman CMA (December 18, 2009 1:56 PM) CC: f/u Is Patient Diabetic? Yes Pain Assessment Patient in pain? yes     Location: sides Intensity: 8   Primary Care Provider:  Angelena Sole MD  CC:  f/u.  History of Present Illness: 1. kidney stones:  She is still having the pain in her sides.   She has an appt with [facility] urology in the beginning of January.  The pain is rated a 8/10.  It is manageable without any pain medicines.  ROS: denies dysuria, fever, hematuria  2. HTN:  She thinks that the blood pressure is still elevated because of the pain.  She has been taking her blood pressure medicines as prescribed.  She doesn't check her blood pressure at home regularly.  She is not interested in starting another medicines until the situation with her kidney stones is figured out.  ROS: denies chest pain, shortness of breath  3. DMII:  She is taking her medicines as prescribed.  She doesn't check her blood sugars regularly but when she does they are usually in the 100's.   Habits & Providers  Alcohol-Tobacco-Diet     Tobacco Status: never  Current Medications (verified): 1)  Toprol Xl 200 Mg Xr24h-Tab (Metoprolol Succinate) .Marland Kitchen.. 1 Tablet By Mouth Once Daily 2)  Accupril 40 Mg Tabs (Quinapril Hcl) .Marland Kitchen.. 1 Tablet 1 Time Per Day 3)  Metformin Hcl 1000 Mg Tabs (Metformin Hcl) .... One Two Times A Day 4)  Klor-Con 20 Meq Pack (Potassium Chloride) .Marland Kitchen.. 1 Tab By Mouth Daily 5)  Ibuprofen 600 Mg Tabs (Ibuprofen) .Marland Kitchen.. 1 Tab By Mouth At The Onset of A Headache. 6)  Zomig 2.5 Mg Tabs (Zolmitriptan) .... Take 1 Tab By Mouth Daily As Needed For Migraine 7)  Glimepiride 2 Mg Tabs (Glimepiride) .... One Tab in Am 8)  [name] 32 Mg Tabs  (Candesartan Cilexetil) .Marland Kitchen.. 1 Tab By Mouth Daily 9)  Crestor 10 Mg Tabs (Rosuvastatin Calcium) .Marland Kitchen.. 1 Tab By Mouth Daily 10)  Hydrochlorothiazide 25 Mg Tabs (Hydrochlorothiazide) .Marland Kitchen.. 1 Tab By Mouth Daily  Allergies: No Known Drug Allergies  Past History:  Past Medical History: Reviewed history from 02/11/2009 and no changes required. Diabetes mellitus, type II Hyperlipidemia Hypertension Right knee pain Kidney stones Lower extremity swelling  Physical Exam  General:  VS reviewed. alert, well-developed, well-nourished, and well-hydrated.  not ill or uncomfortable appearing, in NAD Mouth:  MMM Lungs:  normal respiratory effort, normal breath sounds, no fremitus, no crackles, and no wheezes.   Heart:  Normal rate and regular rhythm. S1 and S2 normal without gallop, murmur, click, rub or other extra sounds. Abdomen:  soft, non-tender, normal bowel sounds, and no distention.   Msk:  minimal flank pain to palpation Extremities:  no lower extremity edema Psych:  not anxious appearing.     Impression & Recommendations:  Problem # 1:  NEPHROLITHIASIS, RECURRENT (ICD-592.0) Assessment Unchanged  F/U with [facility] in January.  No need for pain medicines at this time.  Continue to monitor.  Orders: [facility]- Est  Level 4 (66440)  Problem #  2:  HYPERTENSION (ICD-401.9) Assessment: Unchanged  BP not at goal.  She is not interested in starting a new medication until her kidney stone situation is figured out.  Advised her that if elevated at next visit that we will need to make a change. Her updated medication list for this problem includes:    Toprol Xl 200 Mg Xr24h-tab (Metoprolol succinate) .Marland Kitchen... 1 tablet by mouth once daily    Accupril 40 Mg Tabs (Quinapril hcl) .Marland Kitchen... 1 tablet 1 time per day    [name] 32 Mg Tabs (Candesartan cilexetil) .Marland Kitchen... 1 tab by mouth daily    Hydrochlorothiazide 25 Mg Tabs (Hydrochlorothiazide) .Marland Kitchen... 1 tab by mouth daily  Orders: [facility]- Est  Level 4  (91478)  Problem # 3:  DIABETES MELLITUS, TYPE II (ICD-250.00) Assessment: Unchanged  Last A1C not at goal.  CBGs pretty good according to patient.  Recheck A1C at next visit.  Continue current medications but may need adjusted at next visit. Her updated medication list for this problem includes:    Accupril 40 Mg Tabs (Quinapril hcl) .Marland Kitchen... 1 tablet 1 time per day    Metformin Hcl 1000 Mg Tabs (Metformin hcl) ..... One two times a day    Glimepiride 2 Mg Tabs (Glimepiride) ..... One tab in am    [name] 32 Mg Tabs (Candesartan cilexetil) .Marland Kitchen... 1 tab by mouth daily  Orders: [facility]- Est  Level 4 (29562)  Complete Medication List: 1)  Toprol Xl 200 Mg Xr24h-tab (Metoprolol succinate) .Marland Kitchen.. 1 tablet by mouth once daily 2)  Accupril 40 Mg Tabs (Quinapril hcl) .Marland Kitchen.. 1 tablet 1 time per day 3)  Metformin Hcl 1000 Mg Tabs (Metformin hcl) .... One two times a day 4)  Klor-con 20 Meq Pack (Potassium chloride) .Marland Kitchen.. 1 tab by mouth daily 5)  Ibuprofen 600 Mg Tabs (Ibuprofen) .Marland Kitchen.. 1 tab by mouth at the onset of a headache. 6)  Zomig 2.5 Mg Tabs (Zolmitriptan) .... Take 1 tab by mouth daily as needed for migraine 7)  Glimepiride 2 Mg Tabs (Glimepiride) .... One tab in am 8)  [name] 32 Mg Tabs (Candesartan cilexetil) .Marland Kitchen.. 1 tab by mouth daily 9)  Crestor 10 Mg Tabs (Rosuvastatin calcium) .Marland Kitchen.. 1 tab by mouth daily 10)  Hydrochlorothiazide 25 Mg Tabs (Hydrochlorothiazide) .Marland Kitchen.. 1 tab by mouth daily Prescriptions: CRESTOR 10 MG TABS (ROSUVASTATIN CALCIUM) 1 tab by mouth daily  #90 x 3   Entered and Authorized by:   Angelena Sole MD   Signed by:   Angelena Sole MD on 12/18/2009   Method used:   Faxed to ...       [facility] Department (retail)       [address] [place], [place]  [address]       Ph: [phone]       Fax: [phone]   RxID:   [id number] [name] 32 MG TABS (CANDESARTAN CILEXETIL) 1 tab by mouth daily  #90 x 3   Entered and Authorized by:   Angelena Sole  MD   Signed by:   Angelena Sole MD on 12/18/2009   Method used:   Faxed to ...       [facility] Department (retail)       [address] [place], [place]  [address]       Ph: [phone]       Fax: [phone]   RxID:   [id number] HYDROCHLOROTHIAZIDE 25 MG TABS (  HYDROCHLOROTHIAZIDE) 1 tab by mouth daily  #90 x 3   Entered and Authorized by:   Angelena Sole MD   Signed by:   Angelena Sole MD on 12/18/2009   Method used:   Electronically to        [facility] [address] (retail)       [phone]       [place], [place]  [address]       Ph: [phone]       Fax: [phone]   RxID:   [id number] KLOR-CON 20 MEQ PACK (POTASSIUM CHLORIDE) 1 tab by mouth daily  #90 x 3   Entered and Authorized by:   Angelena Sole MD   Signed by:   Angelena Sole MD on 12/18/2009   Method used:   Electronically to        [facility] [address] (retail)       [phone]       [place], [place]  [address]       Ph: [phone]       Fax: [phone]   RxID:   [id number] METFORMIN HCL 1000 MG TABS (METFORMIN HCL) one two times a day  #60 x 6   Entered and Authorized by:   Angelena Sole MD   Signed by:   Angelena Sole MD on 12/18/2009   Method used:   Electronically to        [facility] [address] (retail)       [phone]       [place], [place]  [address]       Ph: [phone]       Fax: [phone]   RxID:   [id number]    Orders Added: 1)  [facility]- Est  Level 4 [[address]40]

## 2010-03-02 NOTE — Miscellaneous (Signed)
  Clinical Lists Changes  Problems: Removed problem of URI (ICD-465.9) Removed problem of EPIPHORA, UNSPECIFIED AS TO CAUSE (ICD-375.20) Removed problem of LEG EDEMA, BILATERAL (ICD-782.3) Removed problem of CRAMP IN LIMB (ICD-729.82) Removed problem of HEADACHE (ICD-784.0) Removed problem of KNEE PAIN, RIGHT (ICD-719.46) Removed problem of PREVENTIVE HEALTH CARE (ICD-V70.0) Removed problem of FAMILY HISTORY DIABETES 1ST DEGREE RELATIVE (ICD-V18.0)

## 2010-03-02 NOTE — Miscellaneous (Signed)
Summary: Re : urology referral  Clinical Lists Changes   received notification from  [facility] that they are unable to complete the referal for urology at this time due to lack of physicians in this speciality group. they will notify patient when they can process the referral. . Theresia Lo RN  October 02, 2009 12:28 PM

## 2010-03-02 NOTE — Assessment & Plan Note (Signed)
Summary: FU/KH   Primary Care Provider:  Angelena Sole MD  CC:  Leg swelling, eye watering, and uri.  History of Present Illness: 1. Leg swelling: Pt here last week with new onset of leg swelling.  She was sent for an echo to evaluate for new CHF.  Echo showed EF 60-65% with normal LV function.  Did show mild pulm hypertension but was otherwise normal.  Was put on Lasix for diuresis and this greatly improved the leg swelling.        ROS: denies any shortness of breath, chest pain, PND, orthopnea  2. Eye watering:  Her right eye has been watering for the past 3 months.  It is becoming more bothersome to the patient.  No inciting event.  No eye redness or swelling.         ROS: noheadache, vision changes  3. Cold: Pt has had a cold for the past couple of days.  She has had cough, runny nose, and sore throat.  Has tried over the counter medicines that haven't really helped       ROS: denies any fevers, n/v/d  4. Right knee pain:  Stable.  Saving up money to be seen by Ortho and maybe do surgery.  Current Problems (verified): 1)  Leg Edema, Bilateral  (ICD-782.3) 2)  Cramp in Limb  (ICD-729.82) 3)  Headache  (ICD-784.0) 4)  ACL Tear, Right Knee  (ICD-844.2) 5)  Obesity  (ICD-278.00) 6)  Knee Pain, Right  (ICD-719.46) 7)  Preventive Health Care  (ICD-V70.0) 8)  Family History Diabetes 1st Degree Relative  (ICD-V18.0) 9)  Hypertension  (ICD-401.9) 10)  Hyperlipidemia  (ICD-272.4) 11)  Diabetes Mellitus, Type II  (ICD-250.00)  Current Medications (verified): 1)  Toprol Xl 200 Mg Xr24h-Tab (Metoprolol Succinate) .Marland Kitchen.. 1 Tablet By Mouth Once Daily 2)  Accupril 40 Mg Tabs (Quinapril Hcl) .Marland Kitchen.. 1 Tablet 1 Time Per Day 3)  [name] 32 Mg Tabs (Candesartan Cilexetil) .Marland Kitchen.. 1 Tablet By Mouth Once Daily 4)  Amlodipine Besylate 10 Mg Tabs (Amlodipine Besylate) .Marland Kitchen.. 1 Tablet By Mouth Once Daily 5)  Crestor 10 Mg Tabs (Rosuvastatin Calcium) .Marland Kitchen.. 1 Tablet By Mouth Once Daily 6)  Metformin Hcl 500 Mg  Tabs (Metformin Hcl) .... 2 Tablets By Mouth Two Times A Day 7)  Klor-Con M20 20 Meq Cr-Tabs (Potassium Chloride Crys Cr) .Marland Kitchen.. 1 Tablet By Mouth Once Daily 8)  Tramadol Hcl 50 Mg Tabs (Tramadol Hcl) .... Take 1 Tab Every 6 Hours As Needed For Pain 9)  Ibuprofen 600 Mg Tabs (Ibuprofen) .Marland Kitchen.. 1 Tab By Mouth At The Onset of A Headache. 10)  Zomig 2.5 Mg Tabs (Zolmitriptan) .... Take 1 Tab By Mouth Daily As Needed For Migraine 11)  Furosemide 40 Mg Tabs (Furosemide) .Marland Kitchen.. 1 Tab By Mouth Two Times A Day For Lower Extremity Edema  Allergies: No Known Drug Allergies  Past History:  Past Medical History: Diabetes mellitus, type II Hyperlipidemia Hypertension Right knee pain Kidney stones Lower extremity swelling  Social History: Reviewed history from 12/15/2008 and no changes required. Moved to [place] from [facility] 09/2007. Works as a Conservation officer, nature at [employer].  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  Enjoys reading and bowling.   Physical Exam  General:  alert, well-developed, well-nourished, and well-hydrated.  not ill or uncomfortable appearing, in NAD Head:  normocephalic and atraumatic.   Eyes:  right eye watering. conjunctiva clear. no periorbital swelling Fundoscopic exam benign - normal vessels, optic disc Ears:  R ear normal and L  ear normal.   Nose:  no external deformity and no nasal discharge.   Lungs:  normal respiratory effort and no wheezes.   Heart:  Normal rate and regular rhythm. S1 and S2 normal without gallop, murmur, click, rub or other extra sounds. Msk:  R-Knee TTP along medial joint line Pulses:  2+ dp pulses Extremities:  1+ left pedal edema and 1+ right pedal edema.     Impression & Recommendations:  Problem # 1:  LEG EDEMA, BILATERAL (ICD-782.3) Assessment Improved  Unsure of cause.  Doesn't appear to be from CHF.  Continue with Lasix. Her updated medication list for this problem includes:    Furosemide 40 Mg Tabs (Furosemide) .Marland Kitchen... 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  Discussed with Dr. Mauricio Po.  May be from OSA.  Consider sleep study.  Should get repeat echo in 1 year.  Orders: [facility]- Est  Level 4 ([address]454)  Problem # 3:  EPIPHORA, UNSPECIFIED AS TO CAUSE (ICD-375.20) Assessment: New  Refer to Ophthalmology  Orders: Ophthalmology Referral (Ophthalmology) [facility]- Est  Level 4 ([address])  Problem # 4:  ACL TEAR, RIGHT KNEE (ICD-844.2) Assessment: Unchanged  Problem # 5:  URI (ICD-465.9) Assessment: New  Supportive care. Her updated medication list for this problem includes:    Ibuprofen 600 Mg Tabs (Ibuprofen) .Marland Kitchen... 1 tab by mouth at the onset of a headache.  Orders: [facility]- Est  Level 4 ([address]214)  Complete Medication List: 1)  Toprol Xl 200 Mg Xr24h-tab (Metoprolol succinate) .Marland Kitchen.. 1 tablet by mouth once daily 2)  Accupril 40 Mg Tabs (Quinapril hcl) .Marland Kitchen.. 1 tablet 1 time per day 3)  [name] 32 Mg Tabs (Candesartan cilexetil) .Marland Kitchen.. 1 tablet by mouth once daily 4)  Amlodipine Besylate 10 Mg Tabs (Amlodipine besylate) .Marland Kitchen.. 1 tablet by mouth once daily 5)  Crestor 10 Mg Tabs (Rosuvastatin calcium) .Marland Kitchen.. 1 tablet by mouth once daily 6)  Metformin Hcl 500 Mg Tabs (Metformin hcl) .... 2 tablets by mouth two times a day 7)  Klor-con M20 20 Meq Cr-tabs (Potassium chloride crys cr) .Marland Kitchen.. 1 tablet by mouth once daily 8)  Tramadol Hcl 50 Mg Tabs (Tramadol hcl) .... Take 1 tab every 6 hours as needed for pain 9)  Ibuprofen 600 Mg Tabs (Ibuprofen) .Marland Kitchen.. 1 tab by mouth at the onset of a headache. 10)  Zomig 2.5 Mg Tabs (Zolmitriptan) .... Take 1 tab by mouth daily as needed for migraine 11)  Furosemide 40 Mg Tabs (Furosemide) .Marland Kitchen.. 1 tab by mouth two times a day for lower extremity edema  Patient Instructions: 1)  I'm glad that your leg swelling is doing better. 2)  Your echo results looked pretty  good.  There was no signs of heart failure. 3)  Continue to take the fluid pills as prescribed 4)  I am going to refer you to an ophthalmologist for your watering eye, they will call you with the scheduled appointment. Prescriptions: KLOR-CON M20 20 MEQ CR-TABS (POTASSIUM CHLORIDE CRYS CR) 1 tablet by mouth once daily  #90 x 3   Entered and Authorized by:   Angelena Sole MD   Signed by:   Angelena Sole MD on 02/11/2009   Method used:   Print then Give to Patient   RxID:   [id number] ACCUPRIL 40 MG TABS (QUINAPRIL HCL) 1 tablet 1 time per day  #  90 x 3   Entered and Authorized by:   Angelena Sole MD   Signed by:   Angelena Sole MD on 02/11/2009   Method used:   Print then Give to Patient   RxID:   [id number]

## 2010-03-02 NOTE — Letter (Signed)
Summary: 2D Echo Results  [facility]  [address]   [place], [place] [address]   Phone: [phone]  Fax: [phone]    02/06/2009  Mercie Eon [address] [place], [place]  [address]  Dear Ms. Hurless,  I have reviewed the report from your echocardiogram.  It does not show signs of heart failure.  Please discuss the results in detail with Dr. Lelon Perla at your scheduled visit next week.  Sincerely, Romero Belling MD  Appended Document: 2D Echo Results mailed.

## 2010-03-02 NOTE — Miscellaneous (Signed)
Summary: Re:  ophthalomology referral  Clinical Lists Changes  recieved notification from [facility] that they are unable to  complete  opththalmology referral due to lack if volunteer physicains in this specialty group. they will notifiy patient when they can process this referral. Theresia Lo RN  March 03, 2009 11:10 AM

## 2010-03-02 NOTE — Letter (Signed)
Summary: Generic Letter  [name]  [address]   [place], [place] [address]   Phone: [phone]  Fax: [phone]    04/05/2009  Mercie Eon [address] [place], [place]  [address]  Dear Ms. Dareen Piano,   I have reviewed your lab results from your visit on 03-31-2009.  With the exception of your glucose being elevated to 300, all labs were normal.  Please make an appointment with Dr. Lelon Perla to discuss your diabetes and to follow up on your leg swelling.         Sincerely,   Alvia Grove DO  Appended Document: Generic Letter mailed.

## 2010-03-02 NOTE — Progress Notes (Signed)
Summary: 2D Echo Results  Phone Note Outgoing Call Call back at Home Phone [phone]   Call placed by: Romero Belling MD,  February 06, 2009 9:55 AM Call placed to: Patient Summary of Call: Attempted to call to discuss 2D echo results.  No answer.  Did not leave voicemail.  Will send letter.

## 2010-03-02 NOTE — Assessment & Plan Note (Signed)
Summary: F/U/KH   Vital Signs:  Patient profile:   50 year old female Height:      68 inches Weight:      294 pounds BMI:     44.86 Temp:     98.4 degrees F oral Pulse rate:   79 / minute BP sitting:   166 / 108  (right arm) Cuff size:   large  Vitals Entered By: Jimmy Footman, CMA (October 01, 2009 8:49 AM) CC: f/u kidney stones Is Patient Diabetic? Yes Did you bring your meter with you today? No   Primary Care Provider:  Angelena Sole MD  CC:  f/u kidney stones.  History of Present Illness: 1. Kidney stones: - pain much improved - now rated a 2/10 - Not taking any pain medicines - Hasn't been to Urology yet  ROS: denies fevers, dysuria, or hematuria  2. HTN - Taking her blood pressure medicines as precribed - Checks her blood pressure at home and it is around 150/100 - Has not been watching her salt intake  ROS: denies chest pain, shortness of breath  3. Hypokalemia - still taking KlorCon   Habits & Providers  Alcohol-Tobacco-Diet     Tobacco Status: never  Current Medications (verified): 1)  Toprol Xl 200 Mg Xr24h-Tab (Metoprolol Succinate) .Marland Kitchen.. 1 Tablet By Mouth Once Daily 2)  Accupril 40 Mg Tabs (Quinapril Hcl) .Marland Kitchen.. 1 Tablet 1 Time Per Day 3)  Metformin Hcl 1000 Mg Tabs (Metformin Hcl) .... One Two Times A Day 4)  Klor-Con 20 Meq Pack (Potassium Chloride) .Marland Kitchen.. 1 Tab By Mouth Daily 5)  Ibuprofen 600 Mg Tabs (Ibuprofen) .Marland Kitchen.. 1 Tab By Mouth At The Onset of A Headache. 6)  Zomig 2.5 Mg Tabs (Zolmitriptan) .... Take 1 Tab By Mouth Daily As Needed For Migraine 7)  Glimepiride 2 Mg Tabs (Glimepiride) .... One Tab in Am 8)  [name] 32 Mg Tabs (Candesartan Cilexetil) .Marland Kitchen.. 1 Tab By Mouth Daily 9)  Crestor 10 Mg Tabs (Rosuvastatin Calcium) .Marland Kitchen.. 1 Tab By Mouth Daily 10)  Hydrochlorothiazide 25 Mg Tabs (Hydrochlorothiazide) .Marland Kitchen.. 1 Tab By Mouth Daily  Allergies: No Known Drug Allergies  Past History:  Past Medical History: Reviewed history from 02/11/2009  and no changes required. Diabetes mellitus, type II Hyperlipidemia Hypertension Right knee pain Kidney stones Lower extremity swelling  Social History: Reviewed history from 06/02/2009 and no changes required. Moved to [place] from [facility] 09/2007. 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  Enjoys reading and bowling.   Physical Exam  General:  VS reviewed. alert, well-developed, well-nourished, and well-hydrated.  not ill or uncomfortable appearing, in NAD Lungs:  normal respiratory effort, normal breath sounds, no fremitus, no crackles, and no wheezes.   Heart:  Normal rate and regular rhythm. S1 and S2 normal without gallop, murmur, click, rub or other extra sounds. Abdomen:  soft, non-tender, normal bowel sounds, and no distention.   Msk:  no flank pain to palpation Extremities:  no lower extremity edema   Impression & Recommendations:  Problem # 1:  NEPHROLITHIASIS, RECURRENT (ICD-592.0) Assessment Improved  Not requiring any pain medicines.  It has likely moved into the bladder.  Follow up with Urology when appt available.  Orders: [facility]- Est  Level 4 ([address])  Problem # 2:  HYPERTENSION (ICD-401.9) Assessment: Deteriorated Not at goal.  Will add HCTZ.  Not sure why she is on atacand and accupril.  Will check BMET today. The  following medications were removed from the medication list:    Furosemide 40 Mg Tabs (Furosemide) .Marland Kitchen... 1 tab by mouth two times a day for lower extremity edema Her updated medication list for this problem includes:    Toprol Xl 200 Mg Xr24h-tab (Metoprolol succinate) .Marland Kitchen... 1 tablet by mouth once daily    Accupril 40 Mg Tabs (Quinapril hcl) .Marland Kitchen... 1 tablet 1 time per day    [name] 32 Mg Tabs (Candesartan cilexetil) .Marland Kitchen... 1 tab by mouth daily    Hydrochlorothiazide 25 Mg Tabs (Hydrochlorothiazide) .Marland Kitchen... 1 tab by mouth daily  Orders: Basic Met-[facility] ([address]) [facility]- Est  Level 4  (09811)  Problem # 3:  HYPOKALEMIA (ICD-276.8) Assessment: Unchanged  Will check BMET to see if she is still hypokalemic.  May not need KCl anymore.  Orders: [facility]- Est  Level 4 (99214)  Complete Medication List: 1)  Toprol Xl 200 Mg Xr24h-tab (Metoprolol succinate) .Marland Kitchen.. 1 tablet by mouth once daily 2)  Accupril 40 Mg Tabs (Quinapril hcl) .Marland Kitchen.. 1 tablet 1 time per day 3)  Metformin Hcl 1000 Mg Tabs (Metformin hcl) .... One two times a day 4)  Klor-con 20 Meq Pack (Potassium chloride) .Marland Kitchen.. 1 tab by mouth daily 5)  Ibuprofen 600 Mg Tabs (Ibuprofen) .Marland Kitchen.. 1 tab by mouth at the onset of a headache. 6)  Zomig 2.5 Mg Tabs (Zolmitriptan) .... Take 1 tab by mouth daily as needed for migraine 7)  Glimepiride 2 Mg Tabs (Glimepiride) .... One tab in am 8)  [name] 32 Mg Tabs (Candesartan cilexetil) .Marland Kitchen.. 1 tab by mouth daily 9)  Crestor 10 Mg Tabs (Rosuvastatin calcium) .Marland Kitchen.. 1 tab by mouth daily 10)  Hydrochlorothiazide 25 Mg Tabs (Hydrochlorothiazide) .Marland Kitchen.. 1 tab by mouth daily  Patient Instructions: 1)  We will start the HCTZ for your blood pressure 2)  Please schedule a follow up appointment in 6 weeks to recheck your blood pressure Prescriptions: HYDROCHLOROTHIAZIDE 25 MG TABS (HYDROCHLOROTHIAZIDE) 1 tab by mouth daily  #30 x 3   Entered and Authorized by:   Angelena Sole MD   Signed by:   Angelena Sole MD on 10/01/2009   Method used:   Electronically to        [facility] [address] (retail)       [phone]       [place], [place]  [address]       Ph: [phone]       Fax: [phone]   RxID:   [id number]

## 2010-03-02 NOTE — Assessment & Plan Note (Signed)
Summary: fu/kh   Vital Signs:  Patient profile:   50 year old female Height:      68 inches Weight:      298 pounds BMI:     45.47 BSA:     2.42 Temp:     98.1 degrees F Pulse rate:   81 / minute BP sitting:   136 / 88  Vitals Entered By: Jone Baseman CMA (Jun 02, 2009 9:49 AM) CC: f/u DM, HTN, and leg swelling Is Patient Diabetic? Yes Did you bring your meter with you today? No Pain Assessment Patient in pain? yes     Location: right ankle Intensity: 9   Primary Care Provider:  Angelena Sole MD  CC:  f/u DM, HTN, and and leg swelling.  History of Present Illness: 1. DMII:  she is taking her medicines as prescribed.  Her blood sugar has been elevated since she stopped the diabetes trial that she was in in March.  She doesn't know what medicine she was on.  She is currently taking Metformin and Glipizide (which was started in April).  This seems to have helped control her blood sugars.  Over the past month her blood sugars have been between 50-200 with most measurements in the low 100s.      ROS: denies numbness / weakness, vision changes  2. HTN: She is taking her medicines as prescribed.  She doesn't check her blood pressure at home regularly.      ROS: denies chest pain, shortness of breath  3. Leg swelling:  Has been dealing with this leg swelling for the past couple of months.  She has been talking Lasix 40 mg twice a day.  She had an echo done in January which was negative for CHF (EF 60-65%) but did show some pulmonary hypertension.  The Lasix have not really helped with the leg swelling.      Meds: Amlodipine      ROS: denies orthopnea, PND  Habits & Providers  Alcohol-Tobacco-Diet     Tobacco Status: never  Current Medications (verified): 1)  Toprol Xl 200 Mg Xr24h-Tab (Metoprolol Succinate) .Marland Kitchen.. 1 Tablet By Mouth Once Daily 2)  Accupril 40 Mg Tabs (Quinapril Hcl) .Marland Kitchen.. 1 Tablet 1 Time Per Day 3)  [name] 32 Mg Tabs (Candesartan Cilexetil) .Marland Kitchen.. 1 Tablet By  Mouth Once Daily 4)  Crestor 10 Mg Tabs (Rosuvastatin Calcium) .Marland Kitchen.. 1 Tablet By Mouth Once Daily 5)  Metformin Hcl 1000 Mg Tabs (Metformin Hcl) .... One Two Times A Day 6)  Klor-Con M20 20 Meq Cr-Tabs (Potassium Chloride Crys Cr) .Marland Kitchen.. 1 Tablet By Mouth Once Daily 7)  Tramadol Hcl 50 Mg Tabs (Tramadol Hcl) .... Take 1 Tab Every 6 Hours As Needed For Pain 8)  Ibuprofen 600 Mg Tabs (Ibuprofen) .Marland Kitchen.. 1 Tab By Mouth At The Onset of A Headache. 9)  Zomig 2.5 Mg Tabs (Zolmitriptan) .... Take 1 Tab By Mouth Daily As Needed For Migraine 10)  Furosemide 40 Mg Tabs (Furosemide) .Marland Kitchen.. 1 Tab By Mouth Two Times A Day For Lower Extremity Edema 11)  Glimepiride 2 Mg Tabs (Glimepiride) .... One Tab in Am 12)  V-4 High Compression Hose  Misc (Elastic Bandages & Supports) .... Use As Directed  Allergies: No Known Drug Allergies  Past History:  Past Medical History: Reviewed history from 02/11/2009 and no changes required. Diabetes mellitus, type II Hyperlipidemia Hypertension Right knee pain Kidney stones Lower extremity swelling  Social History: Moved to [place] from [place] 09/2007. 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  Enjoys reading and bowling.   Physical Exam  General:  VS reviewed. alert, well-developed, well-nourished, and well-hydrated.  not ill or uncomfortable appearing, in NAD Head:  normocephalic and atraumatic.   Mouth:  MMM Neck:  full ROM, no carotid bruits, no JVD Lungs:  normal respiratory effort, normal breath sounds, no fremitus, no crackles, and no wheezes.   Heart:  Normal rate and regular rhythm. S1 and S2 normal without gallop, murmur, click, rub or other extra sounds. Msk:  no joint tenderness and no joint swelling.   Extremities:  2+ lower extremity edema   Impression & Recommendations:  Problem # 1:  LEG EDEMA, BILATERAL (ICD-782.3) Assessment Unchanged  Not improved with Lasix.  She already has  compression hose but has not been wearing it.  Encouraged her to use the hose for the next couple of weeks and see if that helps with the swelling.  I also stopped the Amlodipine to see if that was contributing to the LE swelling. Her updated medication list for this problem includes:    Furosemide 40 Mg Tabs (Furosemide) .Marland Kitchen... 1 tab by mouth two times a day for lower extremity edema  Orders: [facility]- Est  Level 4 (14782)  Problem # 2:  HYPERTENSION (ICD-401.9) Assessment: Unchanged  Stopped Amlodipine given LE swelling.  BP controlled today.  Will monitor BP with this change The following medications were removed from the medication list:    Amlodipine Besylate 10 Mg Tabs (Amlodipine besylate) .Marland Kitchen... 1 tablet by mouth once daily Her updated medication list for this problem includes:    Toprol Xl 200 Mg Xr24h-tab (Metoprolol succinate) .Marland Kitchen... 1 tablet by mouth once daily    Accupril 40 Mg Tabs (Quinapril hcl) .Marland Kitchen... 1 tablet 1 time per day    [name] 32 Mg Tabs (Candesartan cilexetil) .Marland Kitchen... 1 tablet by mouth once daily    Furosemide 40 Mg Tabs (Furosemide) .Marland Kitchen... 1 tab by mouth two times a day for lower extremity edema  Orders: [facility]- Est  Level 4 (95621)  Problem # 3:  DIABETES MELLITUS, TYPE II (ICD-250.00) Assessment: Unchanged  Pt has been taking her medicines as prescribed and her sugars seem pretty well controlled.  She is not due for A1c yet.  Will not make any changes. Her updated medication list for this problem includes:    Accupril 40 Mg Tabs (Quinapril hcl) .Marland Kitchen... 1 tablet 1 time per day    [name] 32 Mg Tabs (Candesartan cilexetil) .Marland Kitchen... 1 tablet by mouth once daily    Metformin Hcl 1000 Mg Tabs (Metformin hcl) ..... One two times a day    Glimepiride 2 Mg Tabs (Glimepiride) ..... One tab in am  Orders: [facility]- Est  Level 4 (30865)  Complete Medication List: 1)  Toprol Xl 200 Mg Xr24h-tab (Metoprolol succinate) .Marland Kitchen.. 1 tablet by mouth once daily 2)  Accupril 40 Mg Tabs (Quinapril  hcl) .Marland Kitchen.. 1 tablet 1 time per day 3)  [name] 32 Mg Tabs (Candesartan cilexetil) .Marland Kitchen.. 1 tablet by mouth once daily 4)  Crestor 10 Mg Tabs (Rosuvastatin calcium) .Marland Kitchen.. 1 tablet by mouth once daily 5)  Metformin Hcl 1000 Mg Tabs (Metformin hcl) .... One two times a day 6)  Klor-con M20 20 Meq Cr-tabs (Potassium chloride crys cr) .Marland Kitchen.. 1 tablet by mouth once daily 7)  Tramadol Hcl 50 Mg Tabs (Tramadol hcl) .... Take 1 tab every 6 hours as needed for  pain 8)  Ibuprofen 600 Mg Tabs (Ibuprofen) .Marland Kitchen.. 1 tab by mouth at the onset of a headache. 9)  Zomig 2.5 Mg Tabs (Zolmitriptan) .... Take 1 tab by mouth daily as needed for migraine 10)  Furosemide 40 Mg Tabs (Furosemide) .Marland Kitchen.. 1 tab by mouth two times a day for lower extremity edema 11)  Glimepiride 2 Mg Tabs (Glimepiride) .... One tab in am 12)  V-4 High Compression Hose Misc (Elastic bandages & supports) .... Use as directed  Patient Instructions: 1)  We will try compression stockings for your leg swelling 2)  I am also going to stop the Amlodipine since that can contribute to leg swelling 3)  Please schedule a follow up appointment in 8 weeks Prescriptions: V-4 HIGH COMPRESSION HOSE  MISC (ELASTIC BANDAGES & SUPPORTS) Use as directed  #2 x 0   Entered and Authorized by:   Angelena Sole MD   Signed by:   Angelena Sole MD on 06/02/2009   Method used:   Faxed to ...       [facility] (retail)       [address] [place], [place]  [address]       Ph: [phone]       Fax: [phone]   RxID:   [id number]   Prevention & Chronic Care Immunizations   Influenza vaccine: Not documented    Tetanus booster: Not documented    Pneumococcal vaccine: Not documented  Colorectal Screening   Hemoccult: Not documented    Colonoscopy: normal  (04/16/2008)   Colonoscopy due: 04/16/2013  Other Screening   Pap smear: Not Indicated  (08/08/2008)   Pap smear due: Not Indicated    Mammogram: ASSESSMENT: Negative  - BI-RADS 1^MM DIGITAL SCREENING  (08/15/2008)   Smoking status: never  (06/02/2009)  Diabetes Mellitus   HgbA1C: 12.2  (05/04/2009)   Hemoglobin A1C due: 07/04/2008    Eye exam: Not documented   Diabetic eye exam action/deferral: Ophthalmology referral  (12/15/2008)    Foot exam: Not documented   High risk foot: Not documented   Foot care education: Not documented   Foot exam due: 08/08/2009    Urine microalbumin/creatinine ratio: Not documented    Diabetes flowsheet reviewed?: Yes   Progress toward A1C goal: Unchanged  Lipids   Total Cholesterol: 100  (05/04/2009)   LDL: 45  (05/04/2009)   LDL Direct: Not documented   HDL: 29  (05/04/2009)   Triglycerides: 128  (05/04/2009)    SGOT (AST): 28  (05/04/2009)   SGPT (ALT): 29  (05/04/2009)   Alkaline phosphatase: 105  (05/04/2009)   Total bilirubin: 0.6  (05/04/2009)    Lipid flowsheet reviewed?: Yes   Progress toward LDL goal: Unchanged  Hypertension   Last Blood Pressure: 136 / 88  (06/02/2009)   Serum creatinine: 0.87  (05/04/2009)   Serum potassium 3.7  (05/04/2009)    Hypertension flowsheet reviewed?: Yes   Progress toward BP goal: Unchanged  Self-Management Support :   Personal Goals (by the next clinic visit) :     Personal A1C goal: 8  (12/15/2008)     Personal blood pressure goal: 130/80  (12/15/2008)     Personal LDL goal: 130  (12/15/2008)    Diabetes self-management support: Not documented   Last diabetes self-management training by diabetes educator: completed    Hypertension self-management support: Not documented    Lipid self-management support: Not documented

## 2010-03-02 NOTE — Assessment & Plan Note (Signed)
Summary: legs swelling/"leaking",df   Vital Signs:  Patient profile:   50 year old female Height:      68 inches Weight:      293.8 pounds BMI:     44.83 Temp:     98.0 degrees F oral Pulse rate:   72 / minute BP sitting:   121 / 84  (right arm) Cuff size:   large  Vitals Entered By: Garen Grams LPN (February 02, 2009 1:40 PM) CC: legs and ankles swelling x 5 days Is Patient Diabetic? Yes Did you bring your meter with you today? No Pain Assessment Patient in pain? yes     Location: legs   Primary Care Provider:  Angelena Sole MD  CC:  legs and ankles swelling x 5 days.  History of Present Illness: 50 yo female presenting with 1 week history of bilatera LE edema without dyspnea, DOE, orthopnea, PND, chest pain.  No prior history of heart disease.  Has been taking Amlodipine x2 years without recent dose change.  Scant weeping from LEFT pretibial area.  Current Medications (verified): 1)  Toprol Xl 200 Mg Xr24h-Tab (Metoprolol Succinate) .Marland Kitchen.. 1 Tablet By Mouth Once Daily 2)  Accupril 40 Mg Tabs (Quinapril Hcl) .Marland Kitchen.. 1 Tablet 1 Time Per Day 3)  [name] 32 Mg Tabs (Candesartan Cilexetil) .Marland Kitchen.. 1 Tablet By Mouth Once Daily 4)  Amlodipine Besylate 10 Mg Tabs (Amlodipine Besylate) .Marland Kitchen.. 1 Tablet By Mouth Once Daily 5)  Crestor 10 Mg Tabs (Rosuvastatin Calcium) .Marland Kitchen.. 1 Tablet By Mouth Once Daily 6)  Metformin Hcl 500 Mg Tabs (Metformin Hcl) .... 2 Tablets By Mouth Two Times A Day 7)  Klor-Con M20 20 Meq Cr-Tabs (Potassium Chloride Crys Cr) .Marland Kitchen.. 1 Tablet By Mouth Once Daily 8)  Tramadol Hcl 50 Mg Tabs (Tramadol Hcl) .... Take 1 Tab Every 6 Hours As Needed For Pain 9)  Ibuprofen 600 Mg Tabs (Ibuprofen) .Marland Kitchen.. 1 Tab By Mouth At The Onset of A Headache. 10)  Zomig 2.5 Mg Tabs (Zolmitriptan) .... Take 1 Tab By Mouth Daily As Needed For Migraine 11)  Furosemide 40 Mg Tabs (Furosemide) .Marland Kitchen.. 1 Tab By Mouth Two Times A Day For Lower Extremity Edema  Allergies (verified): No Known Drug  Allergies  Physical Exam  Additional Exam:  VITALS:  Reviewed, normal GEN: Alert & oriented, no acute distress CARDIO: Regular rate and rhythm, no murmurs/rubs/gallops, 2+ bilateral radial pulses, +JVD RESP: Clear to auscultation, normal work of breathing, no retractions/accessory muscle use ABD: Normoactive bowel sounds, nontender, no masses/hepatosplenomegaly EXT: 3+ bilateral LE edema, mildly tender SKIN: Minor pretibial breakdown, LEFT  EKG REVIEWED:  NSR @ 70 bpm, normal intervals, normal QRS morphology, no ST elevation/depression, nonspecific T wave abnormality.   Impression & Recommendations:  Problem # 1:  LEG EDEMA, BILATERAL (ICD-782.3) Assessment New Concerning for new heart failure.  Will obtain EKG, 2D Echo, CMet to evaluate renal function, liver function, electrolytes.  Diurese with Furosemide two times a day x1 week.  Follow up with PCP in 1 week.  If heart failure, is already on BB and ACE-I, would consider adding Spiranolactone. Her updated medication list for this problem includes:    Furosemide 40 Mg Tabs (Furosemide) .Marland Kitchen... 1 tab by mouth two times a day for lower extremity edema  Orders: 12 Lead EKG (12 Lead EKG) Comp Met-[facility] ([address]272) 2 D Echo (2 D Echo) [facility]- Est Level  3 (53664)  Complete Medication List: 1)  Toprol Xl 200 Mg Xr24h-tab (Metoprolol succinate) .Marland Kitchen.. 1 tablet  by mouth once daily 2)  Accupril 40 Mg Tabs (Quinapril hcl) .Marland Kitchen.. 1 tablet 1 time per day 3)  [name] 32 Mg Tabs (Candesartan cilexetil) .Marland Kitchen.. 1 tablet by mouth once daily 4)  Amlodipine Besylate 10 Mg Tabs (Amlodipine besylate) .Marland Kitchen.. 1 tablet by mouth once daily 5)  Crestor 10 Mg Tabs (Rosuvastatin calcium) .Marland Kitchen.. 1 tablet by mouth once daily 6)  Metformin Hcl 500 Mg Tabs (Metformin hcl) .... 2 tablets by mouth two times a day 7)  Klor-con M20 20 Meq Cr-tabs (Potassium chloride crys cr) .Marland Kitchen.. 1 tablet by mouth once daily 8)  Tramadol Hcl 50 Mg Tabs (Tramadol hcl) .... Take 1 tab every 6  hours as needed for pain 9)  Ibuprofen 600 Mg Tabs (Ibuprofen) .Marland Kitchen.. 1 tab by mouth at the onset of a headache. 10)  Zomig 2.5 Mg Tabs (Zolmitriptan) .... Take 1 tab by mouth daily as needed for migraine 11)  Furosemide 40 Mg Tabs (Furosemide) .Marland Kitchen.. 1 tab by mouth two times a day for lower extremity edema  Patient Instructions: 1)  Please take Furosemide 40 mg by mouth two times a day for a week.  Record your weight every day. 2)  Keep your feet elevated when you are not walking. 3)  We are setting up an echocardiogram of your heart. 4)  Go to the Emergency Room immediately if you become short of breath or develop chest pain. 5)  Please schedule a follow-up appointment in 1 week with Dr. Lelon Perla. Prescriptions: FUROSEMIDE 40 MG TABS (FUROSEMIDE) 1 tab by mouth two times a day for lower extremity edema  #15 x 0   Entered and Authorized by:   Romero Belling MD   Signed by:   Romero Belling MD on 02/02/2009   Method used:   Print then Give to Patient   RxID:   [id number]

## 2010-03-04 NOTE — Assessment & Plan Note (Signed)
Summary: F/U AND COLD SYMPTOMS/[facility]   Vital Signs:  Patient profile:   50 year old female Height:      68 inches Weight:      293.2 pounds BMI:     44.74 Temp:     98.3 degrees F oral Pulse rate:   100 / minute BP sitting:   150 / 82  (left arm) Cuff size:   large  Vitals Entered By: Garen Grams LPN (February 05, 2010 9:56 AM) CC: cough/congestion x 4 days Is Patient Diabetic? Yes Did you bring your meter with you today? No Pain Assessment Patient in pain? no        Primary Care Provider:  Angelena Sole MD  CC:  cough/congestion x 4 days.  History of Present Illness: 1. Cold symptoms:  Pt has viral uri type symptoms for the past 4 days.  She thinks that she got it from someone at work.  She has had a cough, runny nose, nasal congestion.  It is not getting better or worse.  ROS: denies fevers, sinus pain, shortness of breath  2. Neuropathy:  She has had periods of tingling in her feet and hands off and on for the past couple of months.  She also had a fall because it felt like her legs gave out on her.  She was seen in the ED who diagnosed her with neuropathy.  Currenlty her sensation and strength is back to normal.  3. DMII:  She is taking her medicines as prescribed.  She doesn't check her blood sugar regularly but when she does it is not that elevated.  4. HTN:  Pt is taking her medicines as prescribed.  She doesn't check her blood pressure at home regularly.  ROS: denies chest pain, shortness of breath  5. Kidney stone:  Her pain has improved and is currently not having any pain, she doesn't think that she passed the stone because she strains all of her urine.  She has an appointment with [facility] urology in a couple of weeks  ROS: denies hematuria  Habits & Providers  Alcohol-Tobacco-Diet     Tobacco Status: never  Current Medications (verified): 1)  Toprol Xl 200 Mg Xr24h-Tab (Metoprolol Succinate) .Marland Kitchen.. 1 Tablet By Mouth Once Daily 2)  Accupril 40 Mg Tabs  (Quinapril Hcl) .Marland Kitchen.. 1 Tablet 1 Time Per Day 3)  Metformin Hcl 1000 Mg Tabs (Metformin Hcl) .... One Two Times A Day 4)  Klor-Con 20 Meq Pack (Potassium Chloride) .Marland Kitchen.. 1 Tab By Mouth Daily 5)  Ibuprofen 600 Mg Tabs (Ibuprofen) .Marland Kitchen.. 1 Tab By Mouth At The Onset of A Headache. 6)  Zomig 2.5 Mg Tabs (Zolmitriptan) .... Take 1 Tab By Mouth Daily As Needed For Migraine 7)  Glimepiride 2 Mg Tabs (Glimepiride) .... 2 Tabs By Mouth in The Morning 8)  [name] 32 Mg Tabs (Candesartan Cilexetil) .Marland Kitchen.. 1 Tab By Mouth Daily 9)  Crestor 10 Mg Tabs (Rosuvastatin Calcium) .Marland Kitchen.. 1 Tab By Mouth Daily 10)  Hydrochlorothiazide 25 Mg Tabs (Hydrochlorothiazide) .Marland Kitchen.. 1 Tab By Mouth Daily 11)  Alpha-Lipoic Acid 100 Mg Caps (Alpha-Lipoic Acid) .Marland Kitchen.. 1 Tab By Mouth Daily  Allergies: No Known Drug Allergies  Past History:  Past Medical History: Reviewed history from 02/11/2009 and no changes required. Diabetes mellitus, type II Hyperlipidemia Hypertension Right knee pain Kidney stones Lower extremity swelling  Physical Exam  General:  VS reviewed. alert, well-developed, well-nourished, and well-hydrated.  not ill or uncomfortable appearing, in NAD Eyes:  No corneal or  conjunctival inflammation noted. EOMI. Perrla. Funduscopic exam benign, without hemorrhages, exudates or papilledema. Vision grossly normal. Neck:  No deformities, masses, or tenderness noted. Lungs:  Normal respiratory effort, chest expands symmetrically. Lungs are clear to auscultation, no crackles or wheezes. Heart:  Normal rate and regular rhythm. S1 and S2 normal without gallop, murmur, click, rub or other extra sounds. Abdomen:  soft, non-tender, normal bowel sounds, and no distention.   Extremities:  no lower extremity edema Neurologic:  alert & oriented X3, cranial nerves II-XII intact, strength normal in all extremities, and gait normal.  Normal sensation in all extremities.  5/5 strength in all extremities.  Normal gait. Psych:  not  anxious appearing and not depressed appearing.     Impression & Recommendations:  Problem # 1:  VIRAL URI (ICD-465.9) Assessment New  No red flags.  Breathing comfortably.  Supportive care only. Her updated medication list for this problem includes:    Ibuprofen 600 Mg Tabs (Ibuprofen) .Marland Kitchen... 1 tab by mouth at the onset of a headache.  Orders: [facility]- Est  Level 4 (99214)  Problem # 2:  DIABETIC PERIPHERAL NEUROPATHY (ICD-250.60) Assessment: New  Sound like peripheral neuropathy from poorly controlled diabetes.  Will work on diabetes control. Her updated medication list for this problem includes:    Accupril 40 Mg Tabs (Quinapril hcl) .Marland Kitchen... 1 tablet 1 time per day    Metformin Hcl 1000 Mg Tabs (Metformin hcl) ..... One two times a day    Glimepiride 2 Mg Tabs (Glimepiride) .Marland Kitchen... 2 tabs by mouth in the morning    [name] 32 Mg Tabs (Candesartan cilexetil) .Marland Kitchen... 1 tab by mouth daily  Orders: [facility]- Est  Level 4 (17510)  Problem # 3:  DIABETES MELLITUS, TYPE II (ICD-250.00) Assessment: Unchanged  Last A1C not at goal.  Will increase Glimepiride and add in ALA.  Recheck in 6 weeks. Her updated medication list for this problem includes:    Accupril 40 Mg Tabs (Quinapril hcl) .Marland Kitchen... 1 tablet 1 time per day    Metformin Hcl 1000 Mg Tabs (Metformin hcl) ..... One two times a day    Glimepiride 2 Mg Tabs (Glimepiride) .Marland Kitchen... 2 tabs by mouth in the morning    [name] 32 Mg Tabs (Candesartan cilexetil) .Marland Kitchen... 1 tab by mouth daily  Orders: [facility]- Est  Level 4 (25852)  Problem # 4:  HYPERTENSION (ICD-401.9) Assessment: Unchanged  Not at goal.  Will likely need medication adjustment.  Will recheck next visit. Her updated medication list for this problem includes:    Toprol Xl 200 Mg Xr24h-tab (Metoprolol succinate) .Marland Kitchen... 1 tablet by mouth once daily    Accupril 40 Mg Tabs (Quinapril hcl) .Marland Kitchen... 1 tablet 1 time per day    [name] 32 Mg Tabs (Candesartan cilexetil) .Marland Kitchen... 1 tab by mouth daily     Hydrochlorothiazide 25 Mg Tabs (Hydrochlorothiazide) .Marland Kitchen... 1 tab by mouth daily  Orders: [facility]- Est  Level 4 (99214)  Problem # 5:  NEPHROLITHIASIS, RECURRENT (ICD-592.0) Assessment: Improved  Not current pain.  Advised her to keep her appointment with Urology to help prevent recurrences.  Orders: [facility]- Est  Level 4 (99214)  Complete Medication List: 1)  Toprol Xl 200 Mg Xr24h-tab (Metoprolol succinate) .Marland Kitchen.. 1 tablet by mouth once daily 2)  Accupril 40 Mg Tabs (Quinapril hcl) .Marland Kitchen.. 1 tablet 1 time per day 3)  Metformin Hcl 1000 Mg Tabs (Metformin hcl) .... One two times a day 4)  Klor-con 20 Meq Pack (Potassium chloride) .Marland Kitchen.. 1 tab by  mouth daily 5)  Ibuprofen 600 Mg Tabs (Ibuprofen) .Marland Kitchen.. 1 tab by mouth at the onset of a headache. 6)  Zomig 2.5 Mg Tabs (Zolmitriptan) .... Take 1 tab by mouth daily as needed for migraine 7)  Glimepiride 2 Mg Tabs (Glimepiride) .... 2 tabs by mouth in the morning 8)  [name] 32 Mg Tabs (Candesartan cilexetil) .Marland Kitchen.. 1 tab by mouth daily 9)  Crestor 10 Mg Tabs (Rosuvastatin calcium) .Marland Kitchen.. 1 tab by mouth daily 10)  Hydrochlorothiazide 25 Mg Tabs (Hydrochlorothiazide) .Marland Kitchen.. 1 tab by mouth daily 11)  Alpha-lipoic Acid 100 Mg Caps (Alpha-lipoic acid) .Marland Kitchen.. 1 tab by mouth daily  Patient Instructions: 1)  It sounds like you are having peripheral neuropathy 2)  The important thing for [place] to manage is your diabetes 3)  I am going to increase your Glipizide and I also want to add in alpha-lipoic acid (this should help) 4)  Your cold should be better in the next couple of days 5)  Keep your appointment with Urology 6)  Please schedule a follow up appointment with me in 6 weeks Prescriptions: ALPHA-LIPOIC ACID 100 MG CAPS (ALPHA-LIPOIC ACID) 1 tab by mouth daily  #30 x 3   Entered and Authorized by:   Angelena Sole MD   Signed by:   Angelena Sole MD on 02/05/2010   Method used:   Electronically to        [facility] [address] (retail)       [phone]       [place], [place]  [address]       Ph: [phone]       Fax: [phone]   RxID:   [id number] GLIMEPIRIDE 2 MG TABS (GLIMEPIRIDE) 2 tabs by mouth in the morning  #60 x 3   Entered and Authorized by:   Angelena Sole MD   Signed by:   Angelena Sole MD on 02/05/2010   Method used:   Electronically to        [facility] [address] (retail)       [phone]       [place], [place]  [address]       Ph: [phone]       Fax: [phone]   RxID:   [id number]    Orders Added: 1)  [facility]- Est  Level 4 [30160]

## 2010-03-16 ENCOUNTER — Encounter: Payer: Self-pay | Admitting: Family Medicine

## 2010-03-16 ENCOUNTER — Ambulatory Visit (INDEPENDENT_AMBULATORY_CARE_PROVIDER_SITE_OTHER): Payer: Self-pay | Admitting: Family Medicine

## 2010-03-16 VITALS — BP 162/98 | HR 83 | Temp 98.0°F | Ht 68.0 in | Wt 297.0 lb

## 2010-03-16 DIAGNOSIS — E119 Type 2 diabetes mellitus without complications: Secondary | ICD-10-CM

## 2010-03-16 DIAGNOSIS — B373 Candidiasis of vulva and vagina: Secondary | ICD-10-CM | POA: Insufficient documentation

## 2010-03-16 DIAGNOSIS — B3731 Acute candidiasis of vulva and vagina: Secondary | ICD-10-CM

## 2010-03-16 DIAGNOSIS — I10 Essential (primary) hypertension: Secondary | ICD-10-CM

## 2010-03-16 DIAGNOSIS — E785 Hyperlipidemia, unspecified: Secondary | ICD-10-CM

## 2010-03-16 MED ORDER — HYDROCHLOROTHIAZIDE 25 MG PO TABS: 50.0000 mg | ORAL_TABLET | Freq: Every day | ORAL | Status: DC

## 2010-03-16 MED ORDER — FLUCONAZOLE 100 MG PO TABS: 100.0000 mg | ORAL_TABLET | Freq: Every day | ORAL | Status: AC

## 2010-03-16 MED ORDER — ROSUVASTATIN CALCIUM 10 MG PO TABS: 10.0000 mg | ORAL_TABLET | Freq: Every day | ORAL | Status: DC

## 2010-03-16 NOTE — Assessment & Plan Note (Signed)
Not at goal.  Increased HCTZ to 50mg .

## 2010-03-16 NOTE — Progress Notes (Signed)
  Subjective:    Patient ID: Kathryn Crosby, female    DOB: [birth year], 50 y.o.   MRN: [id number]  HPI Comments: History of yeast infections.  Feels like another one.  Diabetes She presents for her follow-up diabetic visit. She has type 2 diabetes mellitus. Her disease course has been stable. Pertinent negatives for hypoglycemia include no headaches. There are no diabetic associated symptoms. Pertinent negatives for diabetes include no blurred vision and no chest pain. There are no diabetic complications. Current diabetic treatment includes oral agent (dual therapy). She is compliant with treatment most of the time. Her weight is stable. There is no change in her home blood glucose trend. Her overall blood glucose range is 180-200 mg/dl.  Hypertension This is a chronic problem. The problem is unchanged. The problem is uncontrolled. Pertinent negatives include no anxiety, blurred vision, chest pain or headaches. Risk factors for coronary artery disease include diabetes mellitus. The current treatment provides no improvement. Compliance problems include diet and exercise.  There is no history of angina, kidney disease or CAD/MI.  Vaginal Itching The patient's primary symptoms include genital itching. This is a recurrent problem. The current episode started in the past 7 days. The problem has been unchanged. The patient is experiencing no pain. Pertinent negatives include no abdominal pain, chills, fever or headaches. She has tried nothing for the symptoms. She uses nothing for contraception. Her menstrual history has been regular.      Review of Systems  Constitutional: Negative for fever and chills.  Eyes: Negative for blurred vision.  Cardiovascular: Negative for chest pain.  Gastrointestinal: Negative for abdominal pain.  Neurological: Negative for headaches.       Objective:   Physical Exam General appearance: alert, cooperative and morbidly obese Eyes: conjunctivae/corneas clear. PERRL,  EOM's intact. Fundi benign. Lungs: clear to auscultation bilaterally Heart: regular rate and rhythm Abdomen: soft, non-tender; bowel sounds normal; no masses,  no organomegaly Extremities: no edema, redness or tenderness in the calves or thighs       Assessment & Plan:

## 2010-03-16 NOTE — Patient Instructions (Signed)
It was good to see you again today.  I'm glad that you are feeling better.  I would like to try and get your blood pressure and diabetes under better control.  We are going to increase the HCTZ to 50mg  daily.  I have also sent in a prescription for Diflucan for the yeast infection

## 2010-03-16 NOTE — Assessment & Plan Note (Signed)
DMII not at goal.  She has just increased her Glimeperide to 4mg  daily earlier this month.  Will not make any new changes today.  Reemphasized dietary changes

## 2010-03-25 ENCOUNTER — Encounter: Payer: Self-pay | Admitting: Family Medicine

## 2010-03-25 DIAGNOSIS — N200 Calculus of kidney: Secondary | ICD-10-CM

## 2010-03-25 NOTE — Progress Notes (Signed)
  Subjective:    Patient ID: Kathryn Crosby, female    DOB: 05-25-1960, 50 y.o.   MRN: [id number]  HPI    Review of Systems     Objective:   Physical Exam        Assessment & Plan:

## 2010-04-07 ENCOUNTER — Ambulatory Visit (INDEPENDENT_AMBULATORY_CARE_PROVIDER_SITE_OTHER): Payer: Self-pay | Admitting: Family Medicine

## 2010-04-07 ENCOUNTER — Encounter: Payer: Self-pay | Admitting: Family Medicine

## 2010-04-07 VITALS — BP 128/80 | HR 80 | Temp 97.6°F | Wt 285.6 lb

## 2010-04-07 DIAGNOSIS — A084 Viral intestinal infection, unspecified: Secondary | ICD-10-CM | POA: Insufficient documentation

## 2010-04-07 DIAGNOSIS — R05 Cough: Secondary | ICD-10-CM | POA: Insufficient documentation

## 2010-04-07 DIAGNOSIS — R059 Cough, unspecified: Secondary | ICD-10-CM

## 2010-04-07 DIAGNOSIS — A088 Other specified intestinal infections: Secondary | ICD-10-CM

## 2010-04-07 LAB — CONVERTED CEMR LAB
ALT: 26 units/L (ref 0–35)
BUN: 22 mg/dL (ref 6–23)
Basophils Absolute: 0.1 10*3/uL (ref 0.0–0.1)
Basophils Relative: 1 % (ref 0–1)
CO2: 24 meq/L (ref 19–32)
Creatinine, Ser: 1.11 mg/dL (ref 0.40–1.20)
Eosinophils Relative: 3 % (ref 0–5)
HCT: 42.8 % (ref 36.0–46.0)
Hemoglobin: 13.9 g/dL (ref 12.0–15.0)
Lymphocytes Relative: 52 % — ABNORMAL HIGH (ref 12–46)
MCHC: 32.5 g/dL (ref 30.0–36.0)
Monocytes Absolute: 0.8 10*3/uL (ref 0.1–1.0)
RDW: 15.2 % (ref 11.5–15.5)
Total Bilirubin: 0.5 mg/dL (ref 0.3–1.2)

## 2010-04-07 LAB — COMPREHENSIVE METABOLIC PANEL
AST: 39 U/L — ABNORMAL HIGH (ref 0–37)
BUN: 22 mg/dL (ref 6–23)
Calcium: 9.4 mg/dL (ref 8.4–10.5)
Chloride: 103 mEq/L (ref 96–112)
Creat: 1.11 mg/dL (ref 0.40–1.20)

## 2010-04-07 LAB — CBC WITH DIFFERENTIAL/PLATELET
HCT: 42.8 % (ref 36.0–46.0)
Hemoglobin: 13.9 g/dL (ref 12.0–15.0)
Lymphocytes Relative: 52 % — ABNORMAL HIGH (ref 12–46)
Lymphs Abs: 4.4 10*3/uL — ABNORMAL HIGH (ref 0.7–4.0)
MCHC: 32.5 g/dL (ref 30.0–36.0)
Monocytes Absolute: 0.8 10*3/uL (ref 0.1–1.0)
Monocytes Relative: 9 % (ref 3–12)
Neutro Abs: 3 10*3/uL (ref 1.7–7.7)
WBC: 8.4 10*3/uL (ref 4.0–10.5)

## 2010-04-07 MED ORDER — BENZONATATE 100 MG PO CAPS: 100.0000 mg | ORAL_CAPSULE | Freq: Four times a day (QID) | ORAL | Status: DC | PRN

## 2010-04-07 NOTE — Progress Notes (Signed)
  Subjective:    Patient ID: Kathryn Crosby, female    DOB: [birth year], 50 y.o.   MRN: [id number]  Influenza This is a new problem. Episode onset: about 7 days ago. The problem occurs constantly. The problem has been unchanged (started as a cough but then she developed diarrhea for the past 4 days.  Tolerating liquids well.  Drank 2 bottles of water this morning.  No nausea). Associated symptoms include anorexia, a change in bowel habit, congestion, coughing, fatigue, headaches and myalgias. Pertinent negatives include no abdominal pain, arthralgias, chest pain, chills, fever, joint swelling, nausea, neck pain, numbness, rash, sore throat, swollen glands, urinary symptoms, visual change or vomiting. Associated symptoms comments: Diarrhea . The symptoms are aggravated by nothing. Treatments tried: mucinex DM. The treatment provided no relief.  Cough This is a new problem. The current episode started in the past 7 days. The problem has been unchanged. The problem occurs every few minutes. The cough is non-productive. Associated symptoms include headaches and myalgias. Pertinent negatives include no chest pain, chills, fever, rash or sore throat. The symptoms are aggravated by nothing. Treatments tried: Mucinex DM made it worse. The treatment provided no relief. There is no history of asthma or COPD.      Review of Systems  Constitutional: Positive for fatigue. Negative for fever and chills.  HENT: Positive for congestion. Negative for sore throat and neck pain.   Respiratory: Positive for cough.   Cardiovascular: Negative for chest pain.  Gastrointestinal: Positive for anorexia and change in bowel habit. Negative for nausea, vomiting and abdominal pain.  Musculoskeletal: Positive for myalgias. Negative for joint swelling and arthralgias.  Skin: Negative for rash.  Neurological: Positive for headaches. Negative for numbness.       Objective:   Physical Exam  Constitutional: She is oriented to  person, place, and time.       Obese, uncomfortable appearing. No acute distress.  Appears well hydrated  HENT:  Head: Normocephalic and atraumatic.  Mouth/Throat: No oropharyngeal exudate.       No sinus tenderness   Eyes: Conjunctivae are normal. Pupils are equal, round, and reactive to light.       Normal fundoscopic exam   Neck: Normal range of motion. Neck supple.  Cardiovascular: Normal rate, regular rhythm and normal heart sounds.   Pulmonary/Chest: Effort normal and breath sounds normal. No respiratory distress. She has no wheezes. She has no rales. She exhibits no tenderness.  Abdominal: Soft. Bowel sounds are normal. She exhibits no distension. There is no tenderness. There is no rebound and no guarding.       occassional non-productive cough  Musculoskeletal: Normal range of motion. She exhibits no edema and no tenderness.  Lymphadenopathy:    She has no cervical adenopathy.  Neurological: She is alert and oriented to person, place, and time. No cranial nerve deficit. Coordination normal.  Skin: Skin is warm and dry. No rash noted. No erythema.  Psychiatric: She has a normal mood and affect.          Assessment & Plan:

## 2010-04-07 NOTE — Patient Instructions (Addendum)
Viral Gastroenteritis, Facts Gastroenteritis is due to infection or inflammation of the stomach and small and large intestines. It is usually an infection caused by a virus. It results in vomiting or diarrhea. It is often called the "stomach flu". But it is not caused by the influenza viruses. For most people, viral gastroenteritis is not serious. People who get it almost always recover completely without any long-term problems. But it is a serious illness for those who are unable to drink enough fluids to replace what was lost through vomiting or diarrhea. Those at risk for dehydration through loss of fluids include:  Infants.   Young children.   Persons who are unable to care for themselves, such as the disabled or elderly.   Immune compromised persons. They may get a more serious illness with greater vomiting or diarrhea. They may need to be hospitalized for treatment to correct or prevent dehydration.  Viral gastroenteritis affects people in all parts of the world. Each virus has its own seasonal activity. For example, in the [place], rotavirus and astrovirus infections occur during the cooler months of the year. Adenovirus infections occur throughout the year. Viral gastroenteritis outbreaks can occur in settings such as:    Schools.   Child care facilities.   Nursing homes.   Banquet halls.   Cruise ships.   Dormitories.   Campgrounds.  CAUSES   Many different viruses can cause gastroenteritis. These include:  Rotaviruses.     Adenoviruses.    Caliciviruses.   Astroviruses.    Norwalk virus.   A group of Noroviruses.     Viral gastroenteritis is not caused by:  Bacteria (such as Salmonella or Escherichia coli).   Parasites (such as Giardia).     Medications.   Other medical conditions though the symptoms may be similar.     Food may be contaminated by food preparers or handlers who have viral gastroenteritis, especially if they do not wash their hands  regularly after using the bathroom. Shellfish may be contaminated by sewage. Persons who eat raw or undercooked shellfish harvested from contaminated waters may get diarrhea. Drinking water can also be contaminated by sewage and spread these viruses.   Viral gastroenteritis is contagious. The viruses that cause gastroenteritis are spread through close contact with infected persons by sharing:  Food.   Water.   Eating utensils.  Individuals may also become infected by eating or drinking contaminated foods or beverages.   SYMPTOMS The main symptoms are watery diarrhea and vomiting. The affected person may also have:  A headache.   Fever.    A stomach ache.   Muscle aches.     The symptoms usually begin 1 to 2 days after infection. It may last for 1 to 10 days depending on which virus causes the illness. Your caregiver can determine if the diarrhea is caused by a virus or by something else. DIAGNOSIS Viral gastroenteritis is usually diagnosed by a caregiver based on the symptoms and medical examination of the patient. Rotavirus infection can be diagnosed by laboratory testing of a stool specimen. Tests to detect other viruses that cause gastroenteritis are not in routine use. Tests may be needed to exclude other diseases. Viral gastroenteritis occurs in people of all ages and backgrounds. But some viruses tend to cause diarrhea among people in specific age groups. Rotavirus infection is the most common cause of diarrhea in:  Infants.   Young children under 63 years old.  Norwalk and Noroviruses are more likely to cause diarrhea  in:  Older children.   Adults.  Adenoviruses and astroviruses cause diarrhea mostly in young children. But older children and adults can also be affected. TREATMENT The most important part of treating viral gastroenteritis in children and adults is to prevent severe loss of fluids (dehydration). This treatment should begin at home. Your caregiver may give you  specific instructions about what kinds of fluid to give. CDC recommends that families with infants and young children keep a supply of oral rehydration solution (ORS) at home at all times. Use the solution when diarrhea first occurs in the child. ORS is available at pharmacies without a prescription. Follow the written directions on the ORS package. And use clean or boiled water. Medications, including antibiotics (which have no effect on viruses), and medicines to stop diarrhea should be avoided unless specifically recommended by a caregiver. There is a vaccine available for infants to prevent Rotavirus. Ask your caregiver if this is appropriate for your child. PREVENTION Persons can reduce their chance of getting infected by:    Frequent hand washing.   Prompt disinfection of contaminated surfaces with household chlorine bleach-based cleaners.   Prompt washing of soiled articles of clothing.   Avoiding food or water that is thought to be contaminated.  HOME CARE INSTRUCTIONS  Drink plenty of clear fluids (water, apple juice, diluted tea). If nausea, stomach cramps or diarrhea are severe, you should start with clear liquid sips, frequently. Slowly increase the amount of fluid given each time, as diarrhea clears. Get plenty of rest. Wash hands thoroughly after using the bathroom to prevent spread to other household members. Do not share towels, utensils, or food.   Only take over-the-counter or prescription medicines for pain, discomfort, or fever as directed by your caregiver.   Use any medications as prescribed. Do not use other medications without discussing with your caregiver.  SEEK MEDICAL CARE IF:  You are unable to hold down liquids.   You become lightheaded.   You do not begin to recover within 1-2 days.  SEEK IMMEDIATE MEDICAL CARE IF:  You develop vomiting that does not stop.   You have blood or coffee-ground appearing material in the material that you vomit.   You develop  blood in your stool, or dark black tarry stool.   You develop persistent severe abdominal pain.   If you are not feeling better in a couple of days please return to clinic.  If you start feeling worse please go to the ED.    Information courtesy of the CDC. Document Released: 04/27/2005 Document Re-Released: 04/13/2009 [facility] Patient Information 2011 [place], [place].

## 2010-04-07 NOTE — Assessment & Plan Note (Signed)
Likely part of viral syndrome or influenza like syndrome.  Offered her a CXR but she refused.  Will get some basic labs to assess for other issues.  Supportive care for now.  Tessalon perrles for the cough.

## 2010-04-07 NOTE — Assessment & Plan Note (Addendum)
Likely viral gastroenteritis.  She has had diarrhea for the past 5 days.  She has been able to tolerate liquids and drank 2 bottles of water this morning.  No indication for IVF.  She doesn't appear dehydrated.  No signs of serious infection.  She doesn't have a fever.  No abdominal pain, sinus pain, chest pain, shortness of breath.  She is breathing comfortably.  Normal neuro exam.  Will still check a CBC and CMET to see if there is anything else going on.  Also offered her a CXR even though the likelihood of pneumonia is low and her only respiratory complaint was cough but she didn't want to have this done.  Will treat with supportive care.  Precautions given.

## 2010-04-13 LAB — POCT I-STAT, CHEM 8
BUN: 18 mg/dL (ref 6–23)
Chloride: 101 mEq/L (ref 96–112)
Creatinine, Ser: 1.1 mg/dL (ref 0.4–1.2)
Glucose, Bld: 178 mg/dL — ABNORMAL HIGH (ref 70–99)
Potassium: 3.3 mEq/L — ABNORMAL LOW (ref 3.5–5.1)

## 2010-04-15 LAB — POTASSIUM: Potassium: 3.4 mEq/L — ABNORMAL LOW (ref 3.5–5.1)

## 2010-04-15 LAB — POCT I-STAT, CHEM 8
Calcium, Ion: 1.08 mmol/L — ABNORMAL LOW (ref 1.12–1.32)
HCT: 43 % (ref 36.0–46.0)
Hemoglobin: 14.6 g/dL (ref 12.0–15.0)
TCO2: 28 mmol/L (ref 0–100)

## 2010-04-15 LAB — URINALYSIS, ROUTINE W REFLEX MICROSCOPIC
Glucose, UA: 100 mg/dL — AB
Ketones, ur: NEGATIVE mg/dL
Specific Gravity, Urine: 1.012 (ref 1.005–1.030)
pH: 7 (ref 5.0–8.0)

## 2010-04-15 LAB — URINE MICROSCOPIC-ADD ON

## 2010-04-16 LAB — URINE CULTURE: Culture  Setup Time: 201108240950

## 2010-04-16 LAB — URINALYSIS, ROUTINE W REFLEX MICROSCOPIC
Bilirubin Urine: NEGATIVE
Nitrite: NEGATIVE
Protein, ur: NEGATIVE mg/dL
Specific Gravity, Urine: 1.014 (ref 1.005–1.030)
Urobilinogen, UA: 0.2 mg/dL (ref 0.0–1.0)

## 2010-04-16 LAB — POCT I-STAT, CHEM 8
Creatinine, Ser: 1 mg/dL (ref 0.4–1.2)
Glucose, Bld: 209 mg/dL — ABNORMAL HIGH (ref 70–99)
HCT: 43 % (ref 36.0–46.0)
Hemoglobin: 14.6 g/dL (ref 12.0–15.0)
Potassium: 3.5 mEq/L (ref 3.5–5.1)
TCO2: 27 mmol/L (ref 0–100)

## 2010-04-16 LAB — CBC
MCH: 26.5 pg (ref 26.0–34.0)
MCHC: 32.4 g/dL (ref 30.0–36.0)
RDW: 15.4 % (ref 11.5–15.5)

## 2010-04-16 LAB — URINE MICROSCOPIC-ADD ON

## 2010-04-16 LAB — DIFFERENTIAL
Basophils Absolute: 0.1 10*3/uL (ref 0.0–0.1)
Basophils Relative: 0 % (ref 0–1)
Eosinophils Absolute: 0.2 10*3/uL (ref 0.0–0.7)
Neutro Abs: 5.5 10*3/uL (ref 1.7–7.7)
Neutrophils Relative %: 49 % (ref 43–77)

## 2010-04-21 LAB — URINALYSIS, ROUTINE W REFLEX MICROSCOPIC
Bilirubin Urine: NEGATIVE
Hgb urine dipstick: NEGATIVE
Ketones, ur: NEGATIVE mg/dL
Nitrite: NEGATIVE
Protein, ur: NEGATIVE mg/dL
Specific Gravity, Urine: 1.028 (ref 1.005–1.030)
Urobilinogen, UA: 0.2 mg/dL (ref 0.0–1.0)

## 2010-04-21 LAB — GLUCOSE, CAPILLARY
Glucose-Capillary: 179 mg/dL — ABNORMAL HIGH (ref 70–99)
Glucose-Capillary: 239 mg/dL — ABNORMAL HIGH (ref 70–99)
Glucose-Capillary: 378 mg/dL — ABNORMAL HIGH (ref 70–99)
Glucose-Capillary: 490 mg/dL — ABNORMAL HIGH (ref 70–99)

## 2010-04-21 LAB — DIFFERENTIAL
Basophils Absolute: 0 10*3/uL (ref 0.0–0.1)
Basophils Relative: 0 % (ref 0–1)
Lymphocytes Relative: 37 % (ref 12–46)
Monocytes Absolute: 0.7 10*3/uL (ref 0.1–1.0)
Monocytes Relative: 7 % (ref 3–12)
Neutro Abs: 5 10*3/uL (ref 1.7–7.7)
Neutrophils Relative %: 54 % (ref 43–77)

## 2010-04-21 LAB — BASIC METABOLIC PANEL
CO2: 22 mEq/L (ref 19–32)
Calcium: 8.8 mg/dL (ref 8.4–10.5)
Creatinine, Ser: 0.92 mg/dL (ref 0.4–1.2)
GFR calc Af Amer: >60 mL/min (ref >60)
GFR calc non Af Amer: >60 mL/min (ref >60)
Sodium: 137 mEq/L (ref 135–145)

## 2010-04-21 LAB — CBC
Hemoglobin: 12.2 g/dL (ref 12.0–15.0)
MCHC: 31.6 g/dL (ref 30.0–36.0)
RBC: 4.51 MIL/uL (ref 3.87–5.11)

## 2010-04-21 LAB — URINE MICROSCOPIC-ADD ON

## 2010-05-16 ENCOUNTER — Emergency Department (HOSPITAL_COMMUNITY): Payer: Self-pay

## 2010-05-16 ENCOUNTER — Emergency Department (HOSPITAL_COMMUNITY)
Admission: EM | Admit: 2010-05-16 | Discharge: 2010-05-16 | Disposition: A | Discharge status: Home / Self Care | Payer: Self-pay | Attending: Emergency Medicine | Admitting: Emergency Medicine

## 2010-05-16 DIAGNOSIS — H579 Unspecified disorder of eye and adnexa: Secondary | ICD-10-CM | POA: Insufficient documentation

## 2010-05-16 DIAGNOSIS — R059 Cough, unspecified: Secondary | ICD-10-CM | POA: Insufficient documentation

## 2010-05-16 DIAGNOSIS — H9209 Otalgia, unspecified ear: Secondary | ICD-10-CM | POA: Insufficient documentation

## 2010-05-16 DIAGNOSIS — E7800 Pure hypercholesterolemia, unspecified: Secondary | ICD-10-CM | POA: Insufficient documentation

## 2010-05-16 DIAGNOSIS — H5789 Other specified disorders of eye and adnexa: Secondary | ICD-10-CM | POA: Insufficient documentation

## 2010-05-16 DIAGNOSIS — R05 Cough: Secondary | ICD-10-CM | POA: Insufficient documentation

## 2010-05-16 DIAGNOSIS — E119 Type 2 diabetes mellitus without complications: Secondary | ICD-10-CM | POA: Insufficient documentation

## 2010-05-16 DIAGNOSIS — I10 Essential (primary) hypertension: Secondary | ICD-10-CM | POA: Insufficient documentation

## 2010-05-16 DIAGNOSIS — J40 Bronchitis, not specified as acute or chronic: Secondary | ICD-10-CM | POA: Insufficient documentation

## 2010-05-16 DIAGNOSIS — J3489 Other specified disorders of nose and nasal sinuses: Secondary | ICD-10-CM | POA: Insufficient documentation

## 2010-05-16 DIAGNOSIS — H9210 Otorrhea, unspecified ear: Secondary | ICD-10-CM | POA: Insufficient documentation

## 2010-05-16 DIAGNOSIS — Z79899 Other long term (current) drug therapy: Secondary | ICD-10-CM | POA: Insufficient documentation

## 2010-05-16 DIAGNOSIS — R070 Pain in throat: Secondary | ICD-10-CM | POA: Insufficient documentation

## 2010-05-18 LAB — POCT CARDIAC MARKERS: Myoglobin, poc: 92.6 ng/mL (ref 12–200)

## 2010-07-27 ENCOUNTER — Encounter: Payer: Self-pay | Admitting: Family Medicine

## 2010-07-27 NOTE — Progress Notes (Signed)
  Subjective:    Patient ID: Kathryn Crosby, female    DOB: March 18, 1960, 50 y.o.   MRN: [id number]  HPI Report from [facility]  50 yo F with hx of significant nephrolithiasis and normal calcium levels with an elevated PTH.  Working her up for hyperparathyroidism   Review of Systems     Objective:   Physical Exam        Assessment & Plan:

## 2010-08-02 ENCOUNTER — Other Ambulatory Visit: Payer: Self-pay | Admitting: Family Medicine

## 2010-08-02 DIAGNOSIS — Z1231 Encounter for screening mammogram for malignant neoplasm of breast: Secondary | ICD-10-CM

## 2010-08-10 ENCOUNTER — Other Ambulatory Visit: Payer: Self-pay | Admitting: *Deleted

## 2010-08-10 DIAGNOSIS — I10 Essential (primary) hypertension: Secondary | ICD-10-CM

## 2010-08-10 MED ORDER — QUINAPRIL HCL 40 MG PO TABS: 40.0000 mg | ORAL_TABLET | Freq: Every day | ORAL | Status: DC

## 2010-08-26 ENCOUNTER — Encounter: Payer: Self-pay | Admitting: Family Medicine

## 2010-08-26 ENCOUNTER — Ambulatory Visit (INDEPENDENT_AMBULATORY_CARE_PROVIDER_SITE_OTHER): Payer: Self-pay | Admitting: Family Medicine

## 2010-08-26 DIAGNOSIS — B3731 Acute candidiasis of vulva and vagina: Secondary | ICD-10-CM

## 2010-08-26 DIAGNOSIS — M25519 Pain in unspecified shoulder: Secondary | ICD-10-CM

## 2010-08-26 DIAGNOSIS — M25512 Pain in left shoulder: Secondary | ICD-10-CM

## 2010-08-26 DIAGNOSIS — B373 Candidiasis of vulva and vagina: Secondary | ICD-10-CM

## 2010-08-26 DIAGNOSIS — I10 Essential (primary) hypertension: Secondary | ICD-10-CM

## 2010-08-26 DIAGNOSIS — R519 Headache, unspecified: Secondary | ICD-10-CM | POA: Insufficient documentation

## 2010-08-26 DIAGNOSIS — R51 Headache: Secondary | ICD-10-CM | POA: Insufficient documentation

## 2010-08-26 MED ORDER — FLUCONAZOLE 100 MG PO TABS: 100.0000 mg | ORAL_TABLET | Freq: Every day | ORAL | Status: DC

## 2010-08-26 MED ORDER — POTASSIUM CHLORIDE CRYS ER 20 MEQ PO TBCR: 20.0000 meq | EXTENDED_RELEASE_TABLET | Freq: Every day | ORAL | Status: DC

## 2010-08-26 MED ORDER — QUINAPRIL HCL 40 MG PO TABS: 40.0000 mg | ORAL_TABLET | Freq: Every day | ORAL | Status: DC

## 2010-08-26 MED ORDER — TRIAMCINOLONE ACETONIDE 10 MG/ML IJ SUSP: 10.0000 mg | Freq: Once | INTRAMUSCULAR | Status: DC

## 2010-08-26 MED ORDER — METFORMIN HCL 1000 MG PO TABS: 1000.0000 mg | ORAL_TABLET | Freq: Two times a day (BID) | ORAL | Status: DC

## 2010-08-26 MED ORDER — METOPROLOL SUCCINATE ER 25 MG PO TB24: 25.0000 mg | ORAL_TABLET | Freq: Every day | ORAL | Status: DC

## 2010-08-26 MED ORDER — CANDESARTAN CILEXETIL 32 MG PO TABS: 32.0000 mg | ORAL_TABLET | Freq: Every day | ORAL | Status: DC

## 2010-08-26 MED ORDER — GLIMEPIRIDE 2 MG PO TABS: 4.0000 mg | ORAL_TABLET | Freq: Every day | ORAL | Status: DC

## 2010-08-26 NOTE — Progress Notes (Signed)
Subjective:    Patient ID: Kathryn Crosby, female    DOB: [birth year], 50 y.o.   MRN: [id number]  HPI 50 yo female presenting to office today to meet me, as well as for left sided body pain and generalized headaches.   1. Left sided body pain- Pt states this pain has been going on for about one month. She says she feels it in her left shoulder, left arm, left hip, left knee and left foot. The shoulder is bothering her the most, and we focused mostly on this pain during this visit. She says she feels like it could be arthritis, but she has never had a pain like this before. She does take IBU for the pain with little relief. She denies any injury. The pain has not changed in quality since onset. She does have tingling in her hands but this is bilaterally, not left sided only. She denies any decreased sensation, but says she is not able to use her shoulder as much.   2. Headaches- Pt states her headaches are in the morning only. She says when she wakes up, she has a headache "all over her head" everyday. They have been getting worse for the last 2 months. She said she sometimes takes excedrin, but the headache goes away eventually. She does have a history of migraines but these headaches are not like that. When asked about her sleeping habits, patient states she sleeps sitting up in her chair. She has a history of sleep apnea diagnosed 20+years ago. At that time she was given a CPAP that she only used for a short period of time.   Also of note, patient is now being seen by [facility] for her kidney stones. She has an appointment with them tomorrow.   Health Maintenance: Mammo- appointment made PAP- 10+ years ago. S/p hysterectomy  Colonoscopy- 2010, given 10 year f/u Never takes flu shot  Needs refills today on [name], Amaryl, Metformin, Toprol, K-dur, Accupril and a new Rx for Diflucan, all of which I will send to pharmacy via fax since the health dept does not accept  e-prescribe.    Review of Systems  Constitutional: Positive for activity change. Negative for fever.  Respiratory: Negative for cough and shortness of breath.   Cardiovascular: Positive for leg swelling. Negative for chest pain and palpitations.  Musculoskeletal: Positive for myalgias and arthralgias. Negative for back pain and gait problem.  Skin: Negative for rash.  Neurological: Positive for numbness and headaches. Negative for dizziness and light-headedness.       Objective:   Physical Exam  Nursing note and vitals reviewed. Constitutional: She is oriented to person, place, and time. She appears well-developed and well-nourished. No distress.  HENT:  Head: Normocephalic and atraumatic.  Eyes: Conjunctivae and EOM are normal. Pupils are equal, round, and reactive to light. Right pupil is round and reactive. Left pupil is round and reactive. Pupils are equal.  Fundoscopic exam:      The right eye shows no arteriolar narrowing and no papilledema.       The left eye shows no arteriolar narrowing and no papilledema.  Neck: Muscular tenderness (Left sided) present. Decreased range of motion (Difficult to turn head to left due to pain in shoulder) present.  Musculoskeletal: She exhibits edema (1+ lower extremities at baseline).       Right shoulder: Normal.       Left shoulder: She exhibits decreased range of motion, tenderness, bony tenderness, pain and decreased strength. She  exhibits no swelling, no effusion, no crepitus and no deformity.       Left shoulder: No atropy noted. Decreased range of active motion, better ROM with passive movement. Tender to palpation at anterior aspect. Sensation normal. Reflexes normal. Hand grip equal bilaterally  Neurological: She is alert and oriented to person, place, and time.          Assessment & Plan:

## 2010-08-26 NOTE — Patient Instructions (Signed)
It was nice to meet you today!  We injected your shoulder today. I hope it begins to feel better soon!   For your headaches, I am concerned about your sleep at night. I am going to refer you for a sleep study. We will call you about scheduling this.  Please come back to see me in 3-4 weeks so I can look at your shoulder again!  Makia Bossi M. Kumar Falwell, M.D.

## 2010-08-26 NOTE — Assessment & Plan Note (Signed)
Given history of sleep apnea and patient not wearing CPAP, I am concerned the daily morning headaches are from hypercapnia. She states they go away throughout the day. Will refer for sleep study initially; after we have the results from the sleep study, we can reassess. Patient is on board with this plan. We will contact her about making the sleep study appointment.

## 2010-08-26 NOTE — Assessment & Plan Note (Signed)
Pt states she has had Diflucan for this before and is requesting a new Rx. Will write for this, and reevaluate at next appointment.

## 2010-08-26 NOTE — Assessment & Plan Note (Signed)
Given the physical exam, do not believe this pain is from a rotator cuff tear, but could likely be an impingement causing inflammation. A steroid injection was performed at posterior shoulder using 3 cc of Marcaine and 2 cc of Kenalog. This was well tolerated. This procedure was explained to patient. It was attended by Dr. Zachery Dauer. Patient told of warning signs of reaction and/or infection. She will return to office in 4 weeks for re-evaluation of pain.   Included in my Ddx is cervical impingement, arthritis, injury/strain, bursitis.

## 2010-08-30 ENCOUNTER — Other Ambulatory Visit: Payer: Self-pay | Admitting: Family Medicine

## 2010-08-30 ENCOUNTER — Telehealth: Payer: Self-pay | Admitting: *Deleted

## 2010-08-30 DIAGNOSIS — R51 Headache: Secondary | ICD-10-CM

## 2010-08-30 MED ORDER — METOPROLOL SUCCINATE ER 200 MG PO TB24: 200.0000 mg | ORAL_TABLET | Freq: Every day | ORAL | Status: DC

## 2010-08-30 NOTE — Telephone Encounter (Addendum)
Received fax from  Pharmacy  about Metoprolol  Succinate  ( Toprol XL )  25 mg , 24 hour tablet.  Pharmacy has faxed back  on original RX, that patient had been on Toprol XL 200 mg one daily. They want to clarify and make sure that it has been decreased. Paged Dr. Mikel Cella and she will check record and enter  in chart and I will call pharmacy back.

## 2010-08-31 ENCOUNTER — Other Ambulatory Visit: Payer: Self-pay | Admitting: Family Medicine

## 2010-08-31 MED ORDER — IBUPROFEN 600 MG PO TABS: 600.0000 mg | ORAL_TABLET | Freq: Four times a day (QID) | ORAL | Status: DC | PRN

## 2010-08-31 NOTE — Telephone Encounter (Signed)
Dr. Mikel Cella entered order for Toprol XL 200 mg one daily. [facility] pharmacy to advise.

## 2010-08-31 NOTE — Progress Notes (Signed)
Spoke with Kathryn Crosby this morning as a follow-up to her visit last week. She states the steroid injection in her shoulder helped a lot. She is still using IBU 600mg  prn for pain. Will refill that Rx today.   Antionette Luster M. Dawna Jakes, M.D.

## 2010-09-06 ENCOUNTER — Ambulatory Visit (HOSPITAL_COMMUNITY): Payer: Self-pay

## 2010-09-09 NOTE — Assessment & Plan Note (Signed)
Sleep study

## 2010-09-09 NOTE — Progress Notes (Signed)
Addended by: Macy Mis on: 09/09/2010 12:12 PM   Modules accepted: Orders

## 2010-09-12 ENCOUNTER — Ambulatory Visit (HOSPITAL_BASED_OUTPATIENT_CLINIC_OR_DEPARTMENT_OTHER): Payer: Self-pay | Attending: Family Medicine

## 2010-09-12 DIAGNOSIS — R51 Headache: Secondary | ICD-10-CM

## 2010-09-12 DIAGNOSIS — G4733 Obstructive sleep apnea (adult) (pediatric): Secondary | ICD-10-CM | POA: Insufficient documentation

## 2010-09-15 ENCOUNTER — Ambulatory Visit (HOSPITAL_COMMUNITY)
Admission: RE | Admit: 2010-09-15 | Discharge: 2010-09-15 | Disposition: A | Discharge status: Home / Self Care | Payer: Self-pay | Source: Ambulatory Visit | Attending: Diagnostic Radiology | Admitting: Diagnostic Radiology

## 2010-09-15 DIAGNOSIS — Z1231 Encounter for screening mammogram for malignant neoplasm of breast: Secondary | ICD-10-CM | POA: Insufficient documentation

## 2010-09-19 DIAGNOSIS — G4733 Obstructive sleep apnea (adult) (pediatric): Secondary | ICD-10-CM

## 2010-09-19 NOTE — Procedures (Signed)
NAME:  Kathryn Crosby, Kathryn Crosby NO.:  [id number]  MEDICAL RECORD NO.:  [id number]          PATIENT TYPE:  OUT  LOCATION:  SLEEP CENTER                 FACILITY:  [facility]  PHYSICIAN:  Tonantzin Mimnaugh D. Maple Hudson, MD, FCCP, FACPDATE OF BIRTH:  September 07[birth year] 1958  DATE OF STUDY:  09/12/2010                           NOCTURNAL POLYSOMNOGRAM  REFERRING PHYSICIAN:  Rodman Pickle, MD  INDICATION FOR STUDY:  Hypersomnia with sleep apnea.  EPWORTH SLEEPINESS SCORE:  Epworth sleepiness score 23/24, BMI 43.6. Weight 287 pounds, height 68 inches.  Neck 16 inches.  Home medications are charted and reviewed.  MEDICATIONS:  SLEEP ARCHITECTURE:  Total sleep time 307.5 minutes with sleep efficiency 87%.  Stage I was 8.6%, stage II 71.4%, stage III 1.3%, REM 18.7% of total sleep time.  Sleep latency 15.5 minutes, REM latency 66.5 minutes, awake after sleep onset 30 minutes, arousal index 27.7. Bedtime medication:  None.  RESPIRATORY DATA:  Apnea/hypopnea index (AHI) 44.3 per hour.  A total of 227 events were scored including 53 obstructive apneas, 26 central apneas, 54 mixed apneas, 94 hypopneas.  Events were seen in all sleep positions.  REM AHI 58.4.  Events did not get numerous until too late in the study night to meet the requirements for application of CPAP titration by split protocol.  Standard protocol requires documentation of enough events to make the diagnosis within the first 2 hours of the study.  Her events were numerous, but mainly in the second half of the night.  OXYGEN DATA:  Moderately loud snoring with oxygen desaturation to a nadir of 86% and a mean oxygen saturation through the study of 94.9% on room air.  CARDIAC DATA:  Normal sinus rhythm.  MOVEMENT-PARASOMNIA:  No significant movement disturbance.  Bathroom x2.  IMPRESSIONS-RECOMMENDATIONS: 1. Severe obstructive sleep apnea/hypopnea syndrome, AHI 44.3 per     hour.  Nonpositional events with significant numbers of central  and     mixed apneas, moderately loud snoring, oxygen desaturation to a     nadir of 86% and a mean oxygen saturation through the study of     94.9% on room air. 2. CPAP split night protocol requirements could not be achieved on     this study night.  Consider return for dedicated CPAP titration     study or evaluate for alternative management as clinically     appropriate.     Jaela Yepez D. Maple Hudson, MD, [facility], FACP Diplomate, Biomedical engineer of Sleep Medicine Electronically Signed    CDY/MEDQ  D:  09/19/2010 13:10:42  T:  09/19/2010 13:19:17  Job:  [id number]

## 2010-09-27 ENCOUNTER — Ambulatory Visit (INDEPENDENT_AMBULATORY_CARE_PROVIDER_SITE_OTHER): Payer: Self-pay | Admitting: Family Medicine

## 2010-09-27 ENCOUNTER — Encounter: Payer: Self-pay | Admitting: Family Medicine

## 2010-09-27 VITALS — BP 154/104 | HR 76 | Temp 97.9°F | Wt 280.8 lb

## 2010-09-27 DIAGNOSIS — I10 Essential (primary) hypertension: Secondary | ICD-10-CM

## 2010-09-27 DIAGNOSIS — E119 Type 2 diabetes mellitus without complications: Secondary | ICD-10-CM

## 2010-09-27 DIAGNOSIS — M25519 Pain in unspecified shoulder: Secondary | ICD-10-CM

## 2010-09-27 DIAGNOSIS — M25512 Pain in left shoulder: Secondary | ICD-10-CM

## 2010-09-27 DIAGNOSIS — G4733 Obstructive sleep apnea (adult) (pediatric): Secondary | ICD-10-CM

## 2010-09-27 LAB — POCT GLYCOSYLATED HEMOGLOBIN (HGB A1C): Hemoglobin A1C: 11.9

## 2010-09-27 NOTE — Assessment & Plan Note (Signed)
Pain improved s/p steroid injection. Will continue to monitor. Encourage her to take IBU as needed for pain.

## 2010-09-27 NOTE — Assessment & Plan Note (Signed)
Severe sleep apnea on sleep study done on 09/12/10. Will need to arrange for her to have a CPAP titration study for her to get home CPAP. I believe CPAP will help her greatly and I will continue to follow her progress.

## 2010-09-27 NOTE — Patient Instructions (Signed)
It was so nice to see you today! I am glad your shoulder is feeling better!   I am going to send you for a CPAP titration study to get you set up with a CPAP to use at home. I think this will help your blood pressure and make you feel better.   I will see you back in 6 weeks or so. At that time we will do some lab work and re-evaluate your blood pressure after you have used your CPAP.  Take care! Rasheen Bells M. Ewald Beg, M.D.

## 2010-09-27 NOTE — Progress Notes (Signed)
  Subjective:    Patient ID: Kathryn Crosby, female    DOB: [birth year], 50 y.o.   MRN: [id number]  HPI Pt is a 50 yo female presenting to the office today for a follow-up s/p left shoulder injection and s/p sleep study. No other concerns today.   1. Shoulder pain has much improved after the steroid injection last month. Pt states she is able to participate in her daily activities with no problems. She said the injection as well as IBU prn have helped. She has no further concerns about her shoulder, but we will continue to monitor.  2. Sleep study revealed severe obstructive sleep apnea with a AHI of 44.3. A total of 227 events reported (53 obstructive, 26 central, 54 mixed and 94 hypopneas) Since these events did not get too numerous until late in the night, she was not eligible for a split study to do CPAP titration and she will need to return to have that done.  3. My concern for her today is her HTN. BP is 150's/100's. She states she has been stressed and had to drive in from [place] today which stressed her out. She is on 3 antihypertensive agents, which are not fully maximized but she states she does take them.   Review of Systems  Constitutional: Negative for fever and activity change.  HENT: Negative for congestion, sore throat and facial swelling.   Respiratory: Negative for cough and shortness of breath.   Cardiovascular: Positive for leg swelling. Negative for chest pain.  Gastrointestinal: Negative for nausea, vomiting, diarrhea and constipation.  Musculoskeletal: Negative for back pain and arthralgias.  Skin: Negative for rash.       Objective:   Physical Exam  Vitals reviewed. Constitutional: She is oriented to person, place, and time. She appears well-developed and well-nourished. No distress.  HENT:  Head: Normocephalic and atraumatic.  Neck: Normal range of motion. Neck supple.  Cardiovascular: Normal rate, regular rhythm and normal heart sounds.   No murmur  heard. Pulmonary/Chest: Effort normal and breath sounds normal. No respiratory distress.  Abdominal: Soft. There is no tenderness.  Musculoskeletal: Normal range of motion. She exhibits no edema and no tenderness.       Left shoulder: She exhibits normal range of motion, no tenderness, no bony tenderness, no swelling, no effusion, no pain and normal strength.  Neurological: She is alert and oriented to person, place, and time.  Skin: Skin is warm and dry. She is not diaphoretic.  Psychiatric: She has a normal mood and affect. Her behavior is normal.          Assessment & Plan:

## 2010-09-27 NOTE — Assessment & Plan Note (Signed)
BP still not at goal on 3 agents. These are not maximized at this time. Pt states she is making modifications to her diet and her children are trying to get her to cut back on her salt intake. She states she is stressed, but she is taking her medications. I did not make any modifications to her regimen today but I did encourage her to diet and exercise and we will recheck her BP in 6 weeks after she starts CPAP for her sleep apnea. Maybe this will help lower her blood pressure and make her feel better. Also at her next visit we will do BMP to evaluate her K and renal function.

## 2010-10-08 NOTE — Progress Notes (Signed)
Addended by: Jone Baseman D on: 10/08/2010 02:05 PM   Modules accepted: Orders

## 2010-10-11 ENCOUNTER — Ambulatory Visit (HOSPITAL_BASED_OUTPATIENT_CLINIC_OR_DEPARTMENT_OTHER): Payer: Self-pay | Attending: Family Medicine

## 2010-10-11 DIAGNOSIS — G4710 Hypersomnia, unspecified: Secondary | ICD-10-CM | POA: Insufficient documentation

## 2010-10-19 ENCOUNTER — Ambulatory Visit (INDEPENDENT_AMBULATORY_CARE_PROVIDER_SITE_OTHER): Payer: Self-pay | Admitting: Family Medicine

## 2010-10-19 DIAGNOSIS — J069 Acute upper respiratory infection, unspecified: Secondary | ICD-10-CM

## 2010-10-19 NOTE — Assessment & Plan Note (Signed)
Gave tessalon perles.  Advised to avoid cold medication unless she asked the pharmacist due to her HTN.

## 2010-10-19 NOTE — Progress Notes (Signed)
Pt presents with congestion and productive cough since Friday with fever on Friday and Saturday.  When she coughs she has pain in her chest.  The cough is worse at night as is the congestion.  She thinks the cough is the worst symptom.  She denies allergies or asthma.  She thinks that tessalon perles are helpful to her when she is sick.  Non smoker  Denies HA CP SOB or N/V/D  Exam: Wt 282 T98.0 BP 158/106 P 82 GEN- NAD HEENT- MMM, oropharynx clear with no redness, exudate.  Nasal drainage seen. Lungs- no crackles or wheezes  A/P Viral URI resolving.  Gave tessalon perles.  Advised to avoid cold medication unless she asked the pharmacist due to her HTN.

## 2010-10-23 DIAGNOSIS — R0609 Other forms of dyspnea: Secondary | ICD-10-CM

## 2010-10-23 DIAGNOSIS — R0989 Other specified symptoms and signs involving the circulatory and respiratory systems: Secondary | ICD-10-CM

## 2010-10-23 DIAGNOSIS — G4733 Obstructive sleep apnea (adult) (pediatric): Secondary | ICD-10-CM

## 2010-10-25 NOTE — Procedures (Signed)
NAME:  Kathryn Crosby, CONSTANTINE NO.:  [id number]  MEDICAL RECORD NO.:  [id number]          PATIENT TYPE:  OUT  LOCATION:  SLEEP CENTER                 FACILITY:  [facility]  PHYSICIAN:  Clinton D. Maple Hudson, MD, FCCP, FACPDATE OF BIRTH:  08[birth year]28[birth year]  DATE OF STUDY:  10/11/2010                           NOCTURNAL POLYSOMNOGRAM  REFERRING PHYSICIAN:  INDICATION FOR STUDY:  Hypersomnia with sleep apnea.  EPWORTH SLEEPINESS SCORE:  23/24.  BMI 42.  Weight 276 pounds, height 68 inches.  Neck 16 inches.  MEDICATIONS:  Home medications are charted and reviewed.  A baseline diagnostic MPSG on October 13, 2010, recorded an AHI of 44.3 per hour.  CPAP titration is requested.  SLEEP ARCHITECTURE:  Total sleep time 374 minutes with sleep efficiency 87.6%.  Stage I was 2.7%, stage II 63.2%, stage III absent, REM 34.1% of total sleep time.  Sleep latency 25.5 minutes, REM latency 23 minutes, awake after sleep onset 27.5 minutes, arousal index 8.3.  Bedtime medication:  None.  RESPIRATORY DATA:  CPAP titration protocol.  CPAP was titrated to 14 CWP, AHI 0 per hour.  She wore a medium ResMed Quattro FX full-face mask with heated humidifier.  OXYGEN DATA:  Snoring was prevented by CPAP and mean oxygen saturation held 95.9% on room air.  CARDIAC DATA:  Normal sinus rhythm.  MOVEMENT-PARASOMNIA:  No significant movement disturbance.  No bathroom trips.  IMPRESSIONS-RECOMMENDATIONS: 1. Successful continuous positive airway pressure titration to 14 cm     of water pressure, apnea/hypopnea index 0 per hour.  She wore a     medium ResMed Quattro FX full-face mask with heated humidifier.     Snoring was prevented.  Normal oxygenation maintained and high     percentage sleep time spent in REM is consistent with REM rebound     once normal sleep was permitted. 2. Baseline diagnostic nocturnal polysomnogram on September 12, 2010, had     documented apnea/hypopnea index 44.3 per  hour.     Clinton D. Maple Hudson, MD, [facility], FACP Diplomate, Biomedical engineer of Sleep Medicine Electronically Signed    CDY/MEDQ  D:  10/23/2010 09:00:07  T:  10/23/2010 09:22:01  Job:  [id number]

## 2010-11-01 LAB — URINE MICROSCOPIC-ADD ON

## 2010-11-01 LAB — URINALYSIS, ROUTINE W REFLEX MICROSCOPIC
Bilirubin Urine: NEGATIVE
Bilirubin Urine: NEGATIVE
Glucose, UA: NEGATIVE
Ketones, ur: NEGATIVE
Ketones, ur: NEGATIVE
Nitrite: NEGATIVE
Protein, ur: 30 — AB
Specific Gravity, Urine: 1.018
Urobilinogen, UA: 0.2
pH: 6

## 2010-11-01 LAB — OVA AND PARASITE EXAMINATION: Ova and parasites: NONE SEEN

## 2010-11-01 LAB — POCT I-STAT, CHEM 8
Calcium, Ion: 1.14
Chloride: 102
HCT: 42
Potassium: 3.7
Sodium: 140

## 2010-11-01 LAB — CLOSTRIDIUM DIFFICILE EIA

## 2010-11-01 LAB — STOOL CULTURE

## 2010-11-16 ENCOUNTER — Encounter: Payer: Self-pay | Admitting: Family Medicine

## 2010-11-16 ENCOUNTER — Ambulatory Visit (INDEPENDENT_AMBULATORY_CARE_PROVIDER_SITE_OTHER): Payer: Self-pay | Admitting: Family Medicine

## 2010-11-16 VITALS — BP 145/89 | HR 82 | Temp 98.1°F | Ht 68.0 in | Wt 279.0 lb

## 2010-11-16 DIAGNOSIS — M19049 Primary osteoarthritis, unspecified hand: Secondary | ICD-10-CM

## 2010-11-16 DIAGNOSIS — I10 Essential (primary) hypertension: Secondary | ICD-10-CM

## 2010-11-16 DIAGNOSIS — G4733 Obstructive sleep apnea (adult) (pediatric): Secondary | ICD-10-CM

## 2010-11-16 MED ORDER — QUINAPRIL HCL 40 MG PO TABS: 40.0000 mg | ORAL_TABLET | Freq: Every day | ORAL | Status: DC

## 2010-11-16 NOTE — Assessment & Plan Note (Signed)
Offered pt an Xray of hand today. She feels like she should wait on that for now. Will continue to monitor. Pt made aware of red flag symptoms. Will continue her IBU and heat for symptomatic relief.

## 2010-11-16 NOTE — Progress Notes (Signed)
  Subjective:    Patient ID: Kathryn Crosby, female    DOB: [birth year], 50 y.o.   MRN: [id number]  HPI Pt returns to clinic today for a follow-up for her blood pressure and recent sleep study. She is also concerned about a knot on her finger and back of her leg. 1. Blood pressure: Improved. 145/89 today. She is taking all medications as prescribed. She states she does not feel as stressed today. She is currently on 4 medications with little room for change. She does need a refill on her Accupril. 2. Sleep study: Pt had 2 part sleep study which included CPAP titrations. She states after she wore the CPAP at the sleep study, she felt good the next morning and is hopeful that wearing a nightly CPAP will help her. I have reviewed the documents and will make sure she has an order for the CPAP ordered today. 3. Knot on hand: It is on the left DIP joint. No redness, no decreased range of motion. Pt states it has been there for one year, unchanged. She takes IBU as needed and uses moist heat. It is a throbbing pain, especially with use. She has no other affected joints.  4. Knots on back of left leg: Pt is concerned about a blood clot in her leg. She states these 2 "knots" have been there for 7-8 months, unchanged. She can feel them but has not really inspected them to know what they look like. She wears knee high support stockings.   Review of Systems  Constitutional: Negative for fever, activity change and appetite change.  HENT: Negative for congestion and rhinorrhea.   Eyes: Positive for visual disturbance.  Respiratory: Negative for cough and shortness of breath.   Cardiovascular: Negative for chest pain.  Gastrointestinal: Negative for abdominal pain.  Genitourinary: Negative for difficulty urinating.  Musculoskeletal: Positive for arthralgias. Negative for myalgias.  All other systems reviewed and are negative.       Objective:   Physical Exam  Nursing note and vitals  reviewed. Constitutional: She is oriented to person, place, and time. She appears well-developed and well-nourished. No distress.  HENT:  Head: Normocephalic and atraumatic.  Eyes: Pupils are equal, round, and reactive to light.  Neck: Normal range of motion. Neck supple. No thyromegaly present.  Cardiovascular: Normal rate, regular rhythm and normal heart sounds.   Pulmonary/Chest: Effort normal and breath sounds normal.  Abdominal: Soft. Bowel sounds are normal. She exhibits no distension. There is no tenderness.  Musculoskeletal: Normal range of motion. She exhibits edema (1+).       Hands: Lymphadenopathy:    She has no cervical adenopathy.  Neurological: She is alert and oriented to person, place, and time. No cranial nerve deficit.  Skin: Skin is warm and dry.       Left posterior thigh:  -One area of firm, nonerythematous, nonpainful. Non-fluctuant. No pus. Appears to be like a scar.  -Another light brown, hard, wart-like seborrheic keratosis. Unchanged.  Psychiatric: She has a normal mood and affect. Her behavior is normal.          Assessment & Plan:

## 2010-11-16 NOTE — Assessment & Plan Note (Signed)
Sleep study showed severe sleep apnea. Had CPAP titration in September. Will order [facility] to set up her CPAP. I feel this will make the patient feel better overall. Will continue to follow.

## 2010-11-16 NOTE — Assessment & Plan Note (Signed)
BP 145/89 today. Will continue current medication regimen for now until she is using her CPAP consistently. I will see her back in 6-8 weeks and recheck her BP at that time. Hopefully with the CPAP her BP will come down and she will be feeling better. If her BP remains high, we will decrease her HCTZ and add amlodipine, if necessary.

## 2010-11-16 NOTE — Patient Instructions (Signed)
It was nice to see you today!  I am glad everything is going well. Take your ibuprofen for your finger pain.  I am not concerned about the knots on your legs, but please let me know if they change at all.   [facility] will contact you about the CPAP. Come back to see me in 6-8 weeks to check up on everything.  Brindle Leyba M. Clayvon Parlett, M.D.

## 2010-11-29 ENCOUNTER — Inpatient Hospital Stay (HOSPITAL_COMMUNITY)
Admission: EM | Admit: 2010-11-29 | Discharge: 2010-11-30 | DRG: 392 | Disposition: A | Discharge status: Home / Self Care | Payer: Self-pay | Attending: Family Medicine | Admitting: Family Medicine

## 2010-11-29 DIAGNOSIS — K224 Dyskinesia of esophagus: Principal | ICD-10-CM | POA: Diagnosis present

## 2010-11-29 DIAGNOSIS — E119 Type 2 diabetes mellitus without complications: Secondary | ICD-10-CM

## 2010-11-29 DIAGNOSIS — E785 Hyperlipidemia, unspecified: Secondary | ICD-10-CM

## 2010-11-29 DIAGNOSIS — I2789 Other specified pulmonary heart diseases: Secondary | ICD-10-CM | POA: Diagnosis present

## 2010-11-29 DIAGNOSIS — G4733 Obstructive sleep apnea (adult) (pediatric): Secondary | ICD-10-CM | POA: Diagnosis present

## 2010-11-29 DIAGNOSIS — E1149 Type 2 diabetes mellitus with other diabetic neurological complication: Secondary | ICD-10-CM | POA: Diagnosis present

## 2010-11-29 DIAGNOSIS — I10 Essential (primary) hypertension: Secondary | ICD-10-CM | POA: Diagnosis present

## 2010-11-29 DIAGNOSIS — Z79899 Other long term (current) drug therapy: Secondary | ICD-10-CM

## 2010-11-29 DIAGNOSIS — E1142 Type 2 diabetes mellitus with diabetic polyneuropathy: Secondary | ICD-10-CM | POA: Diagnosis present

## 2010-11-29 DIAGNOSIS — E669 Obesity, unspecified: Secondary | ICD-10-CM | POA: Diagnosis present

## 2010-11-29 DIAGNOSIS — K219 Gastro-esophageal reflux disease without esophagitis: Secondary | ICD-10-CM | POA: Diagnosis present

## 2010-11-29 DIAGNOSIS — R0789 Other chest pain: Secondary | ICD-10-CM

## 2010-11-29 DIAGNOSIS — E876 Hypokalemia: Secondary | ICD-10-CM | POA: Diagnosis present

## 2010-11-29 DIAGNOSIS — N10 Acute tubulo-interstitial nephritis: Secondary | ICD-10-CM | POA: Diagnosis present

## 2010-11-30 ENCOUNTER — Encounter: Payer: Self-pay | Admitting: Family Medicine

## 2010-11-30 ENCOUNTER — Emergency Department (HOSPITAL_COMMUNITY): Payer: Self-pay

## 2010-11-30 LAB — BASIC METABOLIC PANEL
BUN: 15 mg/dL (ref 6–23)
Calcium: 9.5 mg/dL (ref 8.4–10.5)
Calcium: 9.9 mg/dL (ref 8.4–10.5)
Creatinine, Ser: 0.78 mg/dL (ref 0.50–1.10)
Creatinine, Ser: 0.83 mg/dL (ref 0.50–1.10)
GFR calc Af Amer: >90 mL/min (ref >90)
GFR calc non Af Amer: >90 mL/min (ref >90)
Glucose, Bld: 208 mg/dL — ABNORMAL HIGH (ref 70–99)
Sodium: 140 mEq/L (ref 135–145)

## 2010-11-30 LAB — CARDIAC PANEL(CRET KIN+CKTOT+MB+TROPI)
CK, MB: 2.1 ng/mL (ref 0.3–4.0)
Relative Index: INVALID (ref 0.0–2.5)
Total CK: 59 U/L (ref 7–177)
Total CK: 68 U/L (ref 7–177)
Troponin I: <0.3 ng/mL (ref <0.30)

## 2010-11-30 LAB — COMPREHENSIVE METABOLIC PANEL
ALT: 12 U/L (ref 0–35)
Alkaline Phosphatase: 87 U/L (ref 39–117)
CO2: 27 mEq/L (ref 19–32)
GFR calc Af Amer: 85 mL/min — ABNORMAL LOW (ref >90)
GFR calc non Af Amer: 73 mL/min — ABNORMAL LOW (ref >90)
Glucose, Bld: 268 mg/dL — ABNORMAL HIGH (ref 70–99)
Potassium: 2.9 mEq/L — ABNORMAL LOW (ref 3.5–5.1)
Sodium: 138 mEq/L (ref 135–145)
Total Protein: 7.1 g/dL (ref 6.0–8.3)

## 2010-11-30 LAB — LIPID PANEL
LDL Cholesterol: 51 mg/dL (ref 0–99)
Triglycerides: 119 mg/dL (ref <150)

## 2010-11-30 LAB — DIFFERENTIAL
Eosinophils Relative: 2 % (ref 0–5)
Lymphocytes Relative: 33 % (ref 12–46)
Lymphs Abs: 4.3 10*3/uL — ABNORMAL HIGH (ref 0.7–4.0)
Monocytes Absolute: 0.8 10*3/uL (ref 0.1–1.0)

## 2010-11-30 LAB — CBC
HCT: 39.5 % (ref 36.0–46.0)
Hemoglobin: 11.9 g/dL — ABNORMAL LOW (ref 12.0–15.0)
MCH: 26.3 pg (ref 26.0–34.0)
MCHC: 31.4 g/dL (ref 30.0–36.0)
MCV: 83.7 fL (ref 78.0–100.0)
RBC: 4.5 MIL/uL (ref 3.87–5.11)
RDW: 14.3 % (ref 11.5–15.5)
WBC: 11.7 10*3/uL — ABNORMAL HIGH (ref 4.0–10.5)

## 2010-11-30 LAB — URINE MICROSCOPIC-ADD ON

## 2010-11-30 LAB — URINALYSIS, ROUTINE W REFLEX MICROSCOPIC
Bilirubin Urine: NEGATIVE
Ketones, ur: NEGATIVE mg/dL
Nitrite: NEGATIVE
pH: 6 (ref 5.0–8.0)

## 2010-11-30 LAB — GLUCOSE, CAPILLARY: Glucose-Capillary: 233 mg/dL — ABNORMAL HIGH (ref 70–99)

## 2010-11-30 LAB — PRO B NATRIURETIC PEPTIDE: Pro B Natriuretic peptide (BNP): 56.1 pg/mL (ref 0–125)

## 2010-11-30 LAB — TSH: TSH: 2.384 u[IU]/mL (ref 0.350–4.500)

## 2010-11-30 NOTE — H&P (Signed)
Kathryn Crosby is an 50 y.o. female.   Chief Complaint: Chest pain  HPI: Pt with history of not well controlled DMT2, GERD, HTN , HLD and obesity that c/o chest pain that started last night. This  was an isolated episode what brought her to the hospital but has had several in the past month. This episodes are described as chest pain with diaphoresis and SOB of rapid onset aggravated with cough and excerption. The chest pain is described as sharp, throbbing like 8/10 localized in midchest and relieves with rest. No associated to dizziness, change in vision, palpitations. No changes in her BM or urinary habits lately, afebrile. By the time of taking her history in ED pt had no pain.   No past medical history on file.  Past Surgical History  Procedure Date  . Abdominal hysterectomy     No family history on file. Social History:  reports that she has never smoked. She has never used smokeless tobacco. Her alcohol and drug histories not on file. Lives with granddaughter and daughter.   Allergies:  Allergies  Allergen Reactions  . Shellfish-Derived Products Hives and Shortness Of Breath  . Aspirin Hives  . Ivp Dye (Iodinated Diagnostic Agents) Hives    Medications Prior to Admission  Medication Dose Route Frequency Provider Last Rate Last Dose  . triamcinolone acetonide (KENALOG) 10 MG/ML injection 10 mg  10 mg Intra-articular Once Rodman Pickle, MD       Medications Prior to Admission  Medication Sig Dispense Refill  . Alpha-Lipoic Acid 100 MG CAPS Take 1 capsule by mouth daily.        . benzonatate (TESSALON PERLES) 100 MG capsule Take 1 capsule (100 mg total) by mouth every 6 (six) hours as needed for cough.  30 capsule  0  . candesartan ([name]) 32 MG tablet Take 1 tablet (32 mg total) by mouth daily.  30 tablet  5  . glimepiride (AMARYL) 2 MG tablet Take 2 tablets (4 mg total) by mouth daily before breakfast.  60 tablet  5  . hydrochlorothiazide 25 MG tablet Take 2 tablets (50 mg  total) by mouth daily.  180 tablet  3  . ibuprofen (ADVIL,MOTRIN) 600 MG tablet Take 1 tablet (600 mg total) by mouth every 6 (six) hours as needed for pain.  60 tablet  5  . metFORMIN (GLUCOPHAGE) 1000 MG tablet Take 1 tablet (1,000 mg total) by mouth 2 (two) times daily with a meal.  60 tablet  5  . metoprolol succinate (TOPROL-XL) 200 MG 24 hr tablet Take 1 tablet (200 mg total) by mouth daily.  30 tablet  5  . potassium chloride SA (K-DUR,KLOR-CON) 20 MEQ tablet Take 1 tablet (20 mEq total) by mouth daily.  30 tablet  5  . quinapril (ACCUPRIL) 40 MG tablet Take 1 tablet (40 mg total) by mouth at bedtime.  90 tablet  1  . rosuvastatin (CRESTOR) 10 MG tablet Take 1 tablet (10 mg total) by mouth daily.  90 tablet  3  . zolmitriptan (ZOMIG) 2.5 MG tablet Take 2.5 mg by mouth as needed.          Results for orders placed during the hospital encounter of 11/29/10 (from the past 48 hour(s))  URINALYSIS, ROUTINE W REFLEX MICROSCOPIC     Status: Abnormal   Collection Time   11/29/10  1:20 AM      Component Value Range Comment   Color, Urine YELLOW  YELLOW     Appearance TURBID (*)  CLEAR     Specific Gravity, Urine 1.017  1.005 - 1.030     pH 6.0  5.0 - 8.0     Glucose, UA NEGATIVE  NEGATIVE (mg/dL)    Hgb urine dipstick NEGATIVE  NEGATIVE     Bilirubin Urine NEGATIVE  NEGATIVE     Ketones, ur NEGATIVE  NEGATIVE (mg/dL)    Protein, ur NEGATIVE  NEGATIVE (mg/dL)    Urobilinogen, UA 0.2  0.0 - 1.0 (mg/dL)    Nitrite NEGATIVE  NEGATIVE     Leukocytes, UA MODERATE (*) NEGATIVE    URINE MICROSCOPIC-ADD ON     Status: Abnormal   Collection Time   11/29/10  1:20 AM      Component Value Range Comment   Squamous Epithelial / LPF FEW (*) RARE     WBC, UA 11-20  <3 (WBC/hpf)    RBC / HPF 0-2  <3 (RBC/hpf)    Bacteria, UA MANY (*) RARE    DIFFERENTIAL     Status: Abnormal   Collection Time   11/29/10 11:12 PM      Component Value Range Comment   Neutrophils Relative 59  43 - 77 (%)    Neutro  Abs 7.7  1.7 - 7.7 (K/uL)    Lymphocytes Relative 33  12 - 46 (%)    Lymphs Abs 4.3 (*) 0.7 - 4.0 (K/uL)    Monocytes Relative 6  3 - 12 (%)    Monocytes Absolute 0.8  0.1 - 1.0 (K/uL)    Eosinophils Relative 2  0 - 5 (%)    Eosinophils Absolute 0.2  0.0 - 0.7 (K/uL)    Basophils Relative 0  0 - 1 (%)    Basophils Absolute 0.0  0.0 - 0.1 (K/uL)   CBC     Status: Abnormal   Collection Time   11/29/10 11:12 PM      Component Value Range Comment   WBC 13.0 (*) 4.0 - 10.5 (K/uL)    RBC 4.72  3.87 - 5.11 (MIL/uL)    Hemoglobin 12.4  12.0 - 15.0 (g/dL)    HCT 16.1  09.6 - 04.5 (%)    MCV 83.7  78.0 - 100.0 (fL)    MCH 26.3  26.0 - 34.0 (pg)    MCHC 31.4  30.0 - 36.0 (g/dL)    RDW 40.9  81.1 - 91.4 (%)    Platelets 315  150 - 400 (K/uL)   CK TOTAL AND CKMB     Status: Normal   Collection Time   11/29/10 11:12 PM      Component Value Range Comment   Total CK 73  7 - 177 (U/L)    CK, MB 2.4  0.3 - 4.0 (ng/mL)    Relative Index RELATIVE INDEX IS INVALID  0.0 - 2.5    COMPREHENSIVE METABOLIC PANEL     Status: Abnormal   Collection Time   11/29/10 11:12 PM      Component Value Range Comment   Sodium 138  135 - 145 (mEq/L)    Potassium 2.9 (*) 3.5 - 5.1 (mEq/L)    Chloride 98  96 - 112 (mEq/L)    CO2 27  19 - 32 (mEq/L)    Glucose, Bld 268 (*) 70 - 99 (mg/dL)    BUN 15  6 - 23 (mg/dL)    Creatinine, Ser 7.82  0.50 - 1.10 (mg/dL)    Calcium 9.8  8.4 - 10.5 (mg/dL)    Total Protein 7.1  6.0 - 8.3 (g/dL)    Albumin 3.3 (*) 3.5 - 5.2 (g/dL)    AST 14  0 - 37 (U/L)    ALT 12  0 - 35 (U/L)    Alkaline Phosphatase 87  39 - 117 (U/L)    Total Bilirubin 0.3  0.3 - 1.2 (mg/dL)    GFR calc non Af Amer 73 (*) >90 (mL/min)    GFR calc Af Amer 85 (*) >90 (mL/min)   POCT I-STAT TROPONIN I     Status: Normal   Collection Time   11/29/10 11:54 PM      Component Value Range Comment   Troponin i, poc 0.00  0.00 - 0.08 (ng/mL)    Comment 3             Dg Chest 2 View  11/30/2010  *RADIOLOGY  REPORT*  Clinical Data: Chest pain, short of breath, and high blood pressure.  CHEST - 2 VIEW  Comparison: 05/16/2010  Findings: The heart size and pulmonary vascularity are normal. The lungs appear clear and expanded without focal air space disease or consolidation. No blunting of the costophrenic angles.  Tortuous aorta.  Degenerative changes in the thoracic spine.  No significant change since previous study.  IMPRESSION: No evidence of active pulmonary disease.  Original Report Authenticated By: Marlon Pel, M.D.    ROS Per HPI Also denotes some paresthesias bilaterally on upper and lower extremities globe like distribution.  Vitals: T 98.3  BP 154/84  P 79    R 18     O2 98% at RA Physical Exam  Constitutional: She is oriented to person, place, and time. No distress.  HENT:  Mouth/Throat: Oropharynx is clear and moist.  Eyes: EOM are normal. Pupils are equal, round, and reactive to light.  Cardiovascular: Normal rate, regular rhythm and normal heart sounds.   No murmur heard. Respiratory: No respiratory distress. She has no wheezes. She has no rales. She exhibits tenderness.  GI: Bowel sounds are normal. There is no tenderness.  Genitourinary:       Costovertebral tenderness on the right.  Musculoskeletal: She exhibits edema.       2+ pitting edema in LE up to knee bilaterally.  Neurological: She is alert and oriented to person, place, and time.     Assessment/Plan This is a 50 y/o F AA with multiple risks factors including HTN, DM, HLD and  Obesity that is admitted for chest pain and UTI. 1. Chest pain, Atypical, EKG with no new changes since last year but with some atypical st morphology. Not likely cardiac etilogy but can not r/o due to pt increase risk factors.No pain during the time we were talking and examining her. Other etiologies to consider GI, panic attacks. Plan: Cycle enzymes and EKG in the morning. Pt allergic to ASA. GI cocktail.  2.. Hypokalemia: replete with  KCL Po and am labs to recheck. 3. UTI: Pt with hx of nephrolithiasis. Pain on CVA on the right . UA positive for LE and micro with many bacteria, elevated WBC's, normal RBC's and few epi. Also WBC 13.0.: suggests UTI, Plan: ceftriaxone and UCx to narrow spectrum per sensitivity. 4.. DM. Poorly controlled with symptoms that could be diabetes neuropathy Plan: SSI moderated and Lantus 20 u. 5. HTN: continue with home meds since pt can tolerate PO. 6. FEN/GI: CHO diet 7. Prophylaxis: Heparin and Protonix 8. Dispo: Pending result of cardiac enzymes and EKG.      PILOTO, Shivam Mestas 11/30/2010, 3:52  AM    

## 2010-11-30 NOTE — H&P (Signed)
Subjective:    Kathryn Crosby is a 50 y.o. female who presents for evaluation of chest pain. Onset was 1 month ago. Symptoms have worsened since that time.  They were acutely worse earlier this evening, so she came to the ED. The patient describes the pain as sharp and does not radiate. Patient rates pain as a 8/10 in intensity. Associated symptoms are: dyspnea and diaphoresis.. Aggravating factors are: coughing and exercise. Alleviating factors are: rest. Patient's cardiac risk factors are: diabetes mellitus, dyslipidemia, hypertension, obesity (BMI >= 30 kg/m2) and sedentary lifestyle. Patient's risk factors for DVT/PE: none. Previous cardiac testing: dobutamine echo done at old cardiologist in [facility].   The patient also notes some lower back pain on the left side, but denies fevers, chills, dysuria.  She says that she had an episode yesterday where both hands and both feet felt numb but that resolved spontaneously.  The patient endorses a chronic cough that she thinks is from her ACEi, but she also has GERD and does not take any medication for that.  She has not checked her blood sugar when she has had chest pain and diaphoresis.   Current Facility-Administered Medications on File Prior to Visit  Medication Dose Route Frequency Provider Last Rate Last Dose  . triamcinolone acetonide (KENALOG) 10 MG/ML injection 10 mg  10 mg Intra-articular Once Rodman Pickle, MD       Current Outpatient Prescriptions on File Prior to Visit  Medication Sig Dispense Refill  . Alpha-Lipoic Acid 100 MG CAPS Take 1 capsule by mouth daily.        . benzonatate (TESSALON PERLES) 100 MG capsule Take 1 capsule (100 mg total) by mouth every 6 (six) hours as needed for cough.  30 capsule  0  . candesartan ([name]) 32 MG tablet Take 1 tablet (32 mg total) by mouth daily.  30 tablet  5  . glimepiride (AMARYL) 2 MG tablet Take 2 tablets (4 mg total) by mouth daily before breakfast.  60 tablet  5  . hydrochlorothiazide 25  MG tablet Take 2 tablets (50 mg total) by mouth daily.  180 tablet  3  . ibuprofen (ADVIL,MOTRIN) 600 MG tablet Take 1 tablet (600 mg total) by mouth every 6 (six) hours as needed for pain.  60 tablet  5  . metFORMIN (GLUCOPHAGE) 1000 MG tablet Take 1 tablet (1,000 mg total) by mouth 2 (two) times daily with a meal.  60 tablet  5  . metoprolol succinate (TOPROL-XL) 200 MG 24 hr tablet Take 1 tablet (200 mg total) by mouth daily.  30 tablet  5  . potassium chloride SA (K-DUR,KLOR-CON) 20 MEQ tablet Take 1 tablet (20 mEq total) by mouth daily.  30 tablet  5  . quinapril (ACCUPRIL) 40 MG tablet Take 1 tablet (40 mg total) by mouth at bedtime.  90 tablet  1  . rosuvastatin (CRESTOR) 10 MG tablet Take 1 tablet (10 mg total) by mouth daily.  90 tablet  3  . zolmitriptan (ZOMIG) 2.5 MG tablet Take 2.5 mg by mouth as needed.           Patient Active Problem List  Diagnoses  . DIABETES MELLITUS, TYPE II  . HYPERLIPIDEMIA  . HYPOKALEMIA  . OBESITY  . HYPERTENSION  . PULMONARY HYPERTENSION, MILD  . NEPHROLITHIASIS, RECURRENT  . ACL TEAR, RIGHT KNEE  . DIABETIC PERIPHERAL NEUROPATHY  . VIRAL URI  . Candidal vaginitis  . Cough  . Viral gastroenteritis  . Left shoulder pain  .  Headache  . Obstructive sleep apnea  . Osteoarthritis of finger    Family History: + HTN, DM. Social History: Patient does not use tobacco, alcohol, or drugs.   Review of Systems Constitutional: negative for fevers Eyes: negative for visual disturbance Ears, nose, mouth, throat, and face: positive for nasal congestion Respiratory: positive for dyspnea on exertion Cardiovascular: positive for chest pain and lower extremity edema Gastrointestinal: negative for abdominal pain, constipation, diarrhea and melena Genitourinary:negative for dysuria Musculoskeletal:positive for back pain Neurological: positive for dizziness and paresthesia Endocrine: negative for diabetic symptoms including polydipsia and polyuria     Objective:  T97.5  P79  BP 154/84  RR 18  Pulse Ox 98% RA  General appearance: alert, cooperative and no distress Eyes: conjunctivae/corneas clear. PERRL, EOM's intact. Fundi benign. Nose: Nares normal. Septum midline. Mucosa normal. No drainage or sinus tenderness. Throat: lips, mucosa, and tongue normal; teeth and gums normal Back: + left CVA tenderness Lungs: clear to auscultation bilaterally Heart: regular rate and rhythm, S1, S2 normal, no murmur, click, rub or gallop Abdomen: soft, non-tender; bowel sounds normal; no masses,  no organomegaly Extremities: edema trace and Homans sign is negative, no sign of DVT Pulses: 2+ and symmetric Neurologic: Grossly normal  Lab Results  Component Value Date   WBC 13.0* 11/29/2010   HGB 12.4 11/29/2010   HCT 39.5 11/29/2010   MCV 83.7 11/29/2010   PLT 315 11/29/2010   Lab Results  Component Value Date   CREATININE 0.89 11/29/2010   BUN 15 11/29/2010   NA 138 11/29/2010   K 2.9* 11/29/2010   CL 98 11/29/2010   CO2 27 11/29/2010  Glucose  268 Lab Results  Component Value Date   ALT 12 11/29/2010   AST 14 11/29/2010   ALKPHOS 87 11/29/2010   BILITOT 0.3 11/29/2010   Lab Results  Component Value Date   CKTOTAL 73 11/29/2010   CKMB 2.4 11/29/2010  POC Troponin I 0.00  UA: Moderate Leuks, micro few epi's, 11-20 WBC's, few rbc's, many bacteria.   Cardiographics ECG: NSR, non-specific t-wave abnormalities unchanged from previous.   Imaging Chest x-ray: normal chest x-ray    Assessment:    Chest pain, suspected etiology: GERD    Plan:  1) CV- Do not think this is cardiac chest pain, but considering patient's comorbidities will cycle cardiac enzymes and obtain EKG in am.  Will risk stratify with Hg A1C, Lipids, TSH.  Continue home BP medications.  2) ID- patient with CVA tenderness, UA suspicious for UTI, and elevated WBC count, and poorly controlled DM.  Will send urine for culture and treat empirically with Ceftriaxone.   3) GI- will give GI cocktail and start a ppi, see if chest pain and cough improve with this treatment.   4) Endocrine- pt has poorly controlled diabetes, will hold home po medications and place on SSI and lantus with frequent blood sugar monitoring.  5) FEN- will replace K+ and recheck in am.  Will give carb consistent diet, hep lock IV.   6) DVT PPX- Heparin 5000 Units SQ TID. 7) Disposition- pending chest pain rule out and clinical improvement.

## 2010-12-01 LAB — URINE CULTURE

## 2010-12-01 NOTE — Discharge Summary (Signed)
Physician Discharge Summary  Patient ID: Kathryn Crosby [id number] 15-Jan-1961 50 y.o.  Admit date: 11/29/2010 Discharge date: 11/30/2010  PCP: Rodman Pickle, MD, MD, of [facility]  Discharge Diagnosis: Primary 1. Chest Pain, ACS r/o likely GI in origin.   Secondary 1. DIABETES MELLITUS, TYPE II  With neuropathy 2. HYPERLIPIDEMIA  3. OBESITY  4. HYPERTENSION  5. PULMONARY HYPERTENSION, MILD   6. NEPHROLITHIASIS, RECURRENT  7. Hx ACL TEAR, RIGHT KNEE   8. Chronic Cough 9. Obstructive sleep apnea     Hospital Course This is a 50 y/o F AA with multiple risks factors including HTN, DM, HLD and Obesity admitted for chest pain rule out and possible UTI.  1. Chest pain, ACS ruled out. Cardiac enzymes negative x3. EKG with no new changes since last year but with some atypical st morphology. Improved with GI cocktail and with PPI being started.  -Candidate for aspirin but has aspirin allergy -Patient would benefit from outpatient exercise stress test.  2. GERD- believe pain likely GI in origin as typically late in the evening after meals. Pain resolved with GI cocktail. Patient also with chronic cough (also on an ace inhibitor) Started patient on PPI at BID dosing for 2 weeks then will resume once daily. Will see if this helps with chronic cough.  3. HTN-well controlled in house. Given patient recurrently low potassium, would consider spironolactone if has future need for blood pressure medications as this may stop her from needing daily potassium repletion.   4.. Hypokalemia: patient at 2.9 on admit, improved to 3.4 before discharge with oral repletion. On home repletion..  5. UTI: Pt with hx of nephrolithiasis. Pain on CVA on the right at admission.  UA positive for LE and micro with many bacteria, elevated WBC's, normal RBC's and few epi. Also WBC 13.0.: suggests UTI,  Given 1 dose of rocephin and sent out on Keflex for 3 days. Urine culture after discharge grew out  >100,000 of multiple morphotype's. Will need outpatient f/u to see if symptoms resolved.  6.. DM. SSI and Lantus in house. Needs close follow up for diabetes as a1c>11.      Procedures/Imaging:  Dg Chest 2 View  11/30/2010  *RADIOLOGY REPORT*  Clinical Data: Chest pain, short of breath, and high blood pressure.  CHEST - 2 VIEW  Comparison: 05/16/2010  Findings: The heart size and pulmonary vascularity are normal. The lungs appear clear and expanded without focal air space disease or consolidation. No blunting of the costophrenic angles.  Tortuous aorta.  Degenerative changes in the thoracic spine.  No significant change since previous study.  IMPRESSION: No evidence of active pulmonary disease.  Original Report Authenticated By: Marlon Pel, M.D.    Labs  Results for orders placed during the hospital encounter of 11/29/10 (from the past 72 hour(s))  URINALYSIS, ROUTINE W REFLEX MICROSCOPIC     Status: Abnormal   Collection Time   11/29/10  1:20 AM      Component Value Range Comment   Color, Urine YELLOW  YELLOW     Appearance TURBID (*) CLEAR     Specific Gravity, Urine 1.017  1.005 - 1.030     pH 6.0  5.0 - 8.0     Glucose, UA NEGATIVE  NEGATIVE (mg/dL)    Hgb urine dipstick NEGATIVE  NEGATIVE     Bilirubin Urine NEGATIVE  NEGATIVE     Ketones, ur NEGATIVE  NEGATIVE (mg/dL)    Protein, ur NEGATIVE  NEGATIVE (mg/dL)  Urobilinogen, UA 0.2  0.0 - 1.0 (mg/dL)    Nitrite NEGATIVE  NEGATIVE     Leukocytes, UA MODERATE (*) NEGATIVE    URINE MICROSCOPIC-ADD ON     Status: Abnormal   Collection Time   11/29/10  1:20 AM      Component Value Range Comment   Squamous Epithelial / LPF FEW (*) RARE     WBC, UA 11-20  <3 (WBC/hpf)    RBC / HPF 0-2  <3 (RBC/hpf)    Bacteria, UA MANY (*) RARE    CBC     Status: Abnormal   Collection Time   11/29/10 11:12 PM      Component Value Range Comment   WBC 13.0 (*) 4.0 - 10.5 (K/uL)    RBC 4.72  3.87 - 5.11 (MIL/uL)    Hemoglobin 12.4   12.0 - 15.0 (g/dL)    HCT 16.1  09.6 - 04.5 (%)    MCV 83.7  78.0 - 100.0 (fL)    MCH 26.3  26.0 - 34.0 (pg)    MCHC 31.4  30.0 - 36.0 (g/dL)    RDW 40.9  81.1 - 91.4 (%)    Platelets 315  150 - 400 (K/uL)   COMPREHENSIVE METABOLIC PANEL     Status: Abnormal   Collection Time   11/29/10 11:12 PM      Component Value Range Comment   Sodium 138  135 - 145 (mEq/L)    Potassium 2.9 (*) 3.5 - 5.1 (mEq/L)    Chloride 98  96 - 112 (mEq/L)    CO2 27  19 - 32 (mEq/L)    Glucose, Bld 268 (*) 70 - 99 (mg/dL)    BUN 15  6 - 23 (mg/dL)    Creatinine, Ser 7.82  0.50 - 1.10 (mg/dL)    Calcium 9.8  8.4 - 10.5 (mg/dL)    Total Protein 7.1  6.0 - 8.3 (g/dL)    Albumin 3.3 (*) 3.5 - 5.2 (g/dL)    AST 14  0 - 37 (U/L)    ALT 12  0 - 35 (U/L)    Alkaline Phosphatase 87  39 - 117 (U/L)    Total Bilirubin 0.3  0.3 - 1.2 (mg/dL)    GFR calc non Af Amer 73 (*) >90 (mL/min)    GFR calc Af Amer 85 (*) >90 (mL/min)   URINE CULTURE     Status: Normal   Collection Time   11/30/10  1:20 AM      Component Value Range Comment   Specimen Description URINE, RANDOM      Special Requests ADDED AT 0611      Setup Time [id number]      Colony Count >=100,000 COLONIES/ML      Culture        Value: Multiple bacterial morphotypes present, none predominant. Suggest appropriate recollection if clinically indicated.   Report Status 12/01/2010 FINAL     LIPID PANEL     Status: Abnormal   Collection Time   11/30/10  6:55 AM      Component Value Range Comment   Cholesterol 102  0 - 200 (mg/dL)    Triglycerides 469  <150 (mg/dL)    HDL 27 (*) >62 (mg/dL)    Total CHOL/HDL Ratio 3.8      VLDL 24  0 - 40 (mg/dL)    LDL Cholesterol 51  0 - 99 (mg/dL)   PRO B NATRIURETIC PEPTIDE     Status: Normal  Collection Time   11/30/10  6:55 AM      Component Value Range Comment   BNP, POC 56.1  0 - 125 (pg/mL)   CBC     Status: Abnormal   Collection Time   11/30/10  6:55 AM      Component Value Range Comment   WBC 11.7  (*) 4.0 - 10.5 (K/uL)    RBC 4.50  3.87 - 5.11 (MIL/uL)    Hemoglobin 11.9 (*) 12.0 - 15.0 (g/dL)    HCT 16.1  09.6 - 04.5 (%)    MCV 83.3  78.0 - 100.0 (fL)    MCH 26.4  26.0 - 34.0 (pg)    MCHC 31.7  30.0 - 36.0 (g/dL)    RDW 40.9  81.1 - 91.4 (%)    Platelets 296  150 - 400 (K/uL)   TSH     Status: Normal   Collection Time   11/30/10  6:55 AM      Component Value Range Comment   TSH 2.384  0.350 - 4.500 (uIU/mL)   HEMOGLOBIN A1C     Status: Abnormal   Collection Time   11/30/10  6:55 AM      Component Value Range Comment   Hemoglobin A1C 11.6 (*) <5.7 (%)    Mean Plasma Glucose 286 (*) <117 (mg/dL)   BASIC METABOLIC PANEL     Status: Abnormal   Collection Time   11/30/10 10:25 AM      Component Value Range Comment   Sodium 140  135 - 145 (mEq/L)    Potassium 3.4 (*) 3.5 - 5.1 (mEq/L)    Chloride 101  96 - 112 (mEq/L)    CO2 28  19 - 32 (mEq/L)    Glucose, Bld 257 (*) 70 - 99 (mg/dL)    BUN 14  6 - 23 (mg/dL)    Creatinine, Ser 7.82  0.50 - 1.10 (mg/dL)    Calcium 9.9  8.4 - 10.5 (mg/dL)    GFR calc non Af Amer 80 (*) >90 (mL/min)    GFR calc Af Amer >90  >90 (mL/min)      Patient condition at time of discharge/disposition: stable  Disposition-home   Follow up issues: 1. Patient would benefit from outpatient exercise stress test. Can call [facility] cardiology or other group to have this set up.  2. Please see if occasional chest pains have improved.  3. See if patient benefits from PPI in regards to her chronic cough. Other considerations would be changing ace inhibitor.  4. Please follow up urinary tract infection symptoms to see if they have resolved with Keflex.  5. If needs future medication for blood pressure control, consider spironalactone as patient chronically hypokalemic and on supplements.  6. Try to avoid nsaids given GERD and stomach irritation.    Discharge follow up:  Discharge Orders    Future Appointments: Provider: Department: Dept Phone:  Center:   12/14/2010 3:30 PM Rodman Pickle, MD [facility] [phone] [facility]      Discharge Medications Medication List  As of 12/01/2010  3:02 PM   ASK your doctor about these medications         Alpha-Lipoic Acid 100 MG Caps 1 tablet by mouth daily       glimepiride 2 MG tablet   Commonly known as: AMARYL   Take 2 tablets (4 mg total) by mouth daily before breakfast.      hydrochlorothiazide 25 MG tablet   Commonly known as: HYDRODIURIL  Take 1 tablets  by mouth daily.      metFORMIN 1000 MG tablet   Commonly known as: GLUCOPHAGE   Take 1 tablet (1,000 mg total) by mouth 2 (two) times daily with a meal.      metoprolol 25 MG 24 hr tablet   Commonly known as: TOPROL-XL   Take 1 tablet (25 mg total) by mouth daily.      potassium chloride SA 20 MEQ tablet   Commonly known as: K-DUR,KLOR-CON   Take 1 tablet (20 mEq total) by mouth BID      quinapril 40 MG tablet   Commonly known as: ACCUPRIL   Take 1 tablet (40 mg total) by mouth at bedtime.      rosuvastatin 10 MG tablet   Commonly known as: CRESTOR   Take 1 tablet (10 mg total) by mouth daily.        NEW Rx-Keflex 500mg  by mouth TID PO for 3 days New Rx-omeprazole 20mg  BID for 2 weeks then once daily      Tana Conch, MD 12/01/2010 3:02 PM Dictation # [id number]

## 2010-12-03 NOTE — Discharge Summary (Signed)
NAME:  Kathryn Crosby, Kathryn Crosby NO.:  [id number]  MEDICAL RECORD NO.:  [id number]  LOCATION:  2011                         FACILITY:  [facility]  PHYSICIAN:  Leighton Roach Eligha Kmetz, M.D.DATE OF BIRTH:  1960-12-09  DATE OF ADMISSION:  11/29/2010 DATE OF DISCHARGE:  11/30/2010                              DISCHARGE SUMMARY   PRIMARY CARE PHYSICIAN:  Rodman Pickle, MD of [name].  PRIMARY DISCHARGE DIAGNOSIS:  Chest pain, acute coronary syndrome rule out, likely gastrointestinal in origin.  SECONDARY DISCHARGE DIAGNOSES: 1. Diabetes mellitus type 2 with neuropathy. 2. Hyperlipidemia. 3. Obesity. 4. Hypertension. 5. Pulmonary hypertension, mild. 6. Recurrent nephrolithiasis. 7. History of anterior cruciate ligament tear, right knee. 8. Chronic cough. 9. Chronic obstructive sleep apnea.  HOSPITAL COURSE:   This is a 50 y/o F AA with multiple risks factors including HTN, DM, HLD and Obesity admitted for chest pain rule out and possible UTI.   1. Chest pain, ACS ruled out. Cardiac enzymes negative x3. EKG with no new changes since last year but with some atypical st morphology. Improved with GI cocktail and with PPI being started.   -Candidate for aspirin but has aspirin allergy -Patient would benefit from outpatient exercise stress test.  2. GERD- believe pain likely GI in origin as typically late in the evening after meals. Pain resolved with GI cocktail. Patient also with chronic cough (also on an ace inhibitor) Started patient on PPI at BID dosing for 2 weeks then will resume once  daily. Will see if this helps with chronic cough.   3. HTN-well controlled in house. Given patient recurrently low potassium, would consider spironolactone if has future need for blood pressure medications as this may stop her from needing daily potassium repletion.   4.. Hypokalemia: patient at 2.9 on admit, improved to 3.4 before discharge with oral repletion. On home repletion..     5. UTI: Pt with hx of nephrolithiasis. Pain on CVA on the right at admission.  UA positive for LE and micro with many bacteria, elevated WBC's, normal RBC's and few epi. Also WBC 13.0.: suggests UTI,   Given 1 dose of rocephin and sent out on Keflex for 3 days. Urine culture after discharge grew out >100,000 of multiple morphotype's. Will need outpatient f/u to see if symptoms resolved.   6.. DM. SSI and Lantus in house. Needs close follow up for diabetes as a1c>11.   Procedures/Imaging:  1. Dg Chest 2 View-11/30/2010  *RADIOLOGY REPORT*  Clinical Data: Chest pain, short of breath, and high blood pressure.  CHEST - 2 VIEW  Comparison: 05/16/2010  Findings: The heart size and pulmonary vascularity are normal. The lungs appear clear and  expanded without focal air space disease or consolidation. No blunting of the costophrenic angles.  Tortuous aorta.  Degenerative changes in the thoracic spine.  No significant change since previous study.  IMPRESSION: No evidence of active pulmonary  disease.  Original Report Authenticated By: Marlon Pel, M.D.  LABS: 1. Urinalysis:  Turbid appearance, moderate leukocytes.  Urine     microscopic done with few squamous cells and many bacteria. 2. CBC:  White count 13, hemoglobin 12.4, platelets 315. 3. LFTs:  AST  14, ALT 12, alk phos 87, total bili 0.3, albumin 3.3. 4. Urine culture with greater than 100,000 colonies of multiple     bacterial morphotypes present, none predominant. 5. Lipid:  Total cholesterol 102, triglycerides 119, LDL 51, HDL 27. 6. BNP 56.1. 7. TSH 2.384. 8. Hemoglobin A1c 11.6. 9. BMET at the time of discharge; 140, 3.4, 101, 28, 257, 4, 0.83,     9.9.  CONDITION AT THE TIME OF DISCHARGE:  Stable and will be discharged home.  FOLLOWUP ISSUES:   1. Patient would benefit from outpatient exercise stress test. Can call [facility] cardiology or other group to have this set up.   2. Please see if occasional chest pains have  improved.  3. See if patient benefits from PPI in regards to her chronic cough. Other considerations would be changing ace inhibitor.   4. Please follow up urinary tract infection symptoms to see if they have resolved with Keflex.   5. If needs future medication for blood pressure control, consider spironalactone as patient chronically hypokalemic and on supplements.   6. Try to avoid nsaids given GERD and stomach irritation.    DISCHARGE FOLLOWUP:  With Dr. Rodman Pickle on December 14, 2010, at 3:30 p.m.  DISCHARGE MEDICATIONS: 1. Alpha-lipoic acid 100 mg 1 by mouth daily. 2. Amaryl 4 mg daily before breakfast. 3. Hydrochlorothiazide 25 mg daily. 4. Metformin 1000 mg 1 tablet b.i.d. 5. Metoprolol 25 mg 24-hour tablet, take 1 by mouth daily. 6. Potassium chloride 20 mEq by mouth b.i.d. 7. Quinapril 40 mg, take 1 tablet by mouth at bedtime. 8. Rosuvastatin 10 mg, take 1 tablet by mouth daily.  NEW MEDICATIONS: 1. Keflex 500 mg by mouth t.i.d. p.o. for 3 days. 2. Omeprazole 20 mg b.i.d. for 2 weeks, then once daily.    ______________________________ Tana Conch, MD   ______________________________Todd Algis Downs Gurdeep Keesey, M.D.    SH/MEDQ  D:  12/01/2010  T:  12/02/2010  Job:  [id number]  Electronically Signed by Tana Conch MD on 12/02/2010 04:18:44 PM Electronically Signed by Acquanetta Belling M.D. on 12/03/2010 05:22:07 PM

## 2010-12-08 ENCOUNTER — Other Ambulatory Visit: Payer: Self-pay | Admitting: Family Medicine

## 2010-12-08 MED ORDER — ESOMEPRAZOLE MAGNESIUM 40 MG PO CPDR: 40.0000 mg | DELAYED_RELEASE_CAPSULE | Freq: Every day | ORAL | Status: DC

## 2010-12-14 ENCOUNTER — Inpatient Hospital Stay: Payer: Self-pay | Admitting: Family Medicine

## 2011-01-10 ENCOUNTER — Ambulatory Visit (INDEPENDENT_AMBULATORY_CARE_PROVIDER_SITE_OTHER): Payer: Self-pay | Admitting: Family Medicine

## 2011-01-10 ENCOUNTER — Other Ambulatory Visit: Payer: Self-pay | Admitting: *Deleted

## 2011-01-10 ENCOUNTER — Encounter: Payer: Self-pay | Admitting: Family Medicine

## 2011-01-10 DIAGNOSIS — R300 Dysuria: Secondary | ICD-10-CM

## 2011-01-10 DIAGNOSIS — I10 Essential (primary) hypertension: Secondary | ICD-10-CM

## 2011-01-10 DIAGNOSIS — E785 Hyperlipidemia, unspecified: Secondary | ICD-10-CM

## 2011-01-10 DIAGNOSIS — R079 Chest pain, unspecified: Secondary | ICD-10-CM | POA: Insufficient documentation

## 2011-01-10 DIAGNOSIS — B3731 Acute candidiasis of vulva and vagina: Secondary | ICD-10-CM

## 2011-01-10 DIAGNOSIS — B373 Candidiasis of vulva and vagina: Secondary | ICD-10-CM

## 2011-01-10 LAB — POCT URINALYSIS DIPSTICK
Glucose, UA: NEGATIVE
Nitrite, UA: NEGATIVE
Urobilinogen, UA: 0.2

## 2011-01-10 LAB — POCT UA - MICROSCOPIC ONLY

## 2011-01-10 MED ORDER — QUINAPRIL HCL 40 MG PO TABS: 40.0000 mg | ORAL_TABLET | Freq: Every day | ORAL | Status: DC

## 2011-01-10 MED ORDER — CANDESARTAN CILEXETIL 32 MG PO TABS: 32.0000 mg | ORAL_TABLET | Freq: Every day | ORAL | Status: DC

## 2011-01-10 MED ORDER — HYDROCHLOROTHIAZIDE 25 MG PO TABS: 25.0000 mg | ORAL_TABLET | Freq: Every day | ORAL | Status: DC

## 2011-01-10 MED ORDER — ROSUVASTATIN CALCIUM 10 MG PO TABS: 10.0000 mg | ORAL_TABLET | Freq: Every day | ORAL | Status: DC

## 2011-01-10 MED ORDER — OMEPRAZOLE 20 MG PO CPDR: 20.0000 mg | DELAYED_RELEASE_CAPSULE | Freq: Every day | ORAL | Status: DC

## 2011-01-10 MED ORDER — FLUCONAZOLE 100 MG PO TABS: 100.0000 mg | ORAL_TABLET | Freq: Every day | ORAL | Status: DC

## 2011-01-10 MED ORDER — METFORMIN HCL 1000 MG PO TABS: 1000.0000 mg | ORAL_TABLET | Freq: Two times a day (BID) | ORAL | Status: DC

## 2011-01-10 MED ORDER — GLIMEPIRIDE 2 MG PO TABS: 4.0000 mg | ORAL_TABLET | Freq: Every day | ORAL | Status: DC

## 2011-01-10 MED ORDER — HYDROCHLOROTHIAZIDE 25 MG PO TABS: 50.0000 mg | ORAL_TABLET | Freq: Every day | ORAL | Status: DC

## 2011-01-10 NOTE — Patient Instructions (Addendum)
It was nice to see you today!  I'm glad everything is going well for you. Make an appointment for pharmacy clinic for your ambulatory blood pressure monitoring.  I have faxed a prescription to the health department for Prilosec for your reflux.  We will also call you with your appointment for the stress test, as well as the results of your urine test.  Have a very merry Christmas!  Zahari Xiang M. Kaede Clendenen, M.D.

## 2011-01-10 NOTE — Assessment & Plan Note (Signed)
Diagnosed with UTI during hospital admission. Patient was not symptomatic at that time. She would like a repeat UA today which showed   01/10/2011 11:05  Color, UA YELLOW  Specific Gravity, UA 1.015  pH, UA 7.0  Glucose, UA NEG  Bilirubin, UA NEG  Ketones, UA NEG  Clarity, UA CLEAR  Protein, UA NEG  Urobilinogen, UA 0.2  Nitrite, UA NEG  Leukocytes, UA small (1+)  RBC, UA TRACE-INTACT  Epithelial cells, urine per micros 1-5  WBC, Ur, HPF, POC >20  Bacteria, U Microscopic 2+  RBC, urine, microscopic 1-5   Will not give antibiotics today. Patient made aware. If she becomes symptomatic, will recheck urine or just call in antibiotic.

## 2011-01-10 NOTE — Assessment & Plan Note (Signed)
Recent hospital admission. Continues to have chest pain. Needs outpatient myoview. Will refer to cardiology to make this appointment. Communication sent to [facility] team to arrange with cardiology in [facility] where patient has been seen before. Discussed red flag symptoms and when she should return to the ED

## 2011-01-10 NOTE — Progress Notes (Signed)
  Subjective:    Patient ID: Kathryn Crosby, female    DOB: 11/29/1960, 50 y.o.   MRN: [id number]  HPI  Patient is here for a hospital follow up appointment. She was admitted on Oct 30 for chest pain rule out. She presented with intermittent chest pain and had a cardiac work-up. She continues to have some chest pain off/on but nothing severe. Not associated with activity. No crushing pain associated with sweating, impending doom or radiating. She feels like it may be musculoskeletal. She was given a PPI in the hospital which helped, therefore I will give her a Rx for that today. Patient also would like an outpatient stress test scheduled, as that was a recommended outpatient follow-up.  Of note, patients blood pressure is very elevated today. Discharge summary states it was stable inpatient on home medications. She states she does not know when her blood pressure is elevated, but it was also high at her nephrologist appointment last week. She states she is talking all medications and using her CPAP consistently.  Review of Systems  Constitutional: Negative for fever, activity change and appetite change.  HENT: Negative for congestion.   Respiratory: Negative for cough and shortness of breath.   Cardiovascular: Positive for chest pain. Negative for palpitations and leg swelling.  Gastrointestinal: Negative for abdominal pain.  Musculoskeletal: Positive for myalgias and arthralgias.  All other systems reviewed and are negative.       Objective:   Physical Exam  Constitutional: She is oriented to person, place, and time. She appears well-developed and well-nourished. No distress.  HENT:  Head: Normocephalic and atraumatic.  Eyes: Pupils are equal, round, and reactive to light.  Neck: Normal range of motion.  Cardiovascular: Normal rate, regular rhythm and normal heart sounds.   Pulmonary/Chest: Effort normal and breath sounds normal. She has no wheezes.  Abdominal: Soft. She exhibits no  distension. There is no tenderness.  Musculoskeletal: She exhibits no edema.  Lymphadenopathy:    She has no cervical adenopathy.  Neurological: She is alert and oriented to person, place, and time.  Skin: No rash noted. She is not diaphoretic.          Assessment & Plan:

## 2011-01-10 NOTE — Assessment & Plan Note (Signed)
Recurrent. Given refill on Diflucan

## 2011-01-10 NOTE — Assessment & Plan Note (Signed)
Elevated today. Patient currently on 4 agents and CPAP. Since she states she is compliant, and she thinks her BP is elevated at the doctor's office, will refer to pharmacy clinic ambulatory blood pressure monitoring. Dr. Raymondo Band discussed the program with her, and she is interested in participating. Will arrange for her to be seen in pharm clinic next week.

## 2011-01-11 ENCOUNTER — Telehealth: Payer: Self-pay | Admitting: *Deleted

## 2011-01-11 NOTE — Telephone Encounter (Signed)
Pt has an appt with [facility] [address] for 01.08.2013 @ 1100 am. Pt asked that if she cannot keep appt to call their office at [phone] 24 hours in advance to cancel/reschedule. Pt understood and agreed.Loralee Pacas [place]

## 2011-01-12 ENCOUNTER — Other Ambulatory Visit: Payer: Self-pay | Admitting: Family Medicine

## 2011-01-12 MED ORDER — METOPROLOL SUCCINATE ER 200 MG PO TB24: 200.0000 mg | ORAL_TABLET | Freq: Every day | ORAL | Status: DC

## 2011-01-14 ENCOUNTER — Other Ambulatory Visit: Payer: Self-pay | Admitting: Family Medicine

## 2011-01-14 MED ORDER — ESOMEPRAZOLE MAGNESIUM 40 MG PO CPDR: 40.0000 mg | DELAYED_RELEASE_CAPSULE | Freq: Every day | ORAL | Status: DC

## 2011-01-17 ENCOUNTER — Encounter: Payer: Self-pay | Admitting: Pharmacist

## 2011-01-17 ENCOUNTER — Ambulatory Visit (INDEPENDENT_AMBULATORY_CARE_PROVIDER_SITE_OTHER): Payer: Self-pay | Admitting: Pharmacist

## 2011-01-17 VITALS — BP 143/76 | HR 66 | Ht 68.0 in | Wt 283.9 lb

## 2011-01-17 DIAGNOSIS — I10 Essential (primary) hypertension: Secondary | ICD-10-CM

## 2011-01-17 NOTE — Patient Instructions (Addendum)
Thanks for coming in today!  1) Schedule an appointment with Dr. Raymondo Band in the Pharmacy Clinic for next week. 2) Schedule a follow-up appointment with Dr. Mikel Cella the first week of January. 3) A new prescription (Amlodipine) was sent over to your pharmacy. Start taking one tablet daily.  Have a [place] Christmas!   Any Questions: [phone]

## 2011-01-18 MED ORDER — AMLODIPINE BESYLATE 5 MG PO TABS: 5.0000 mg | ORAL_TABLET | Freq: Every day | ORAL | Status: DC

## 2011-01-18 NOTE — Progress Notes (Signed)
Addended by: Hilarie Fredrickson on: 01/18/2011 09:14 AM   Modules accepted: Orders

## 2011-01-19 NOTE — Progress Notes (Signed)
  Subjective:    Patient ID: Kathryn Crosby, female    DOB: [birth year], 50 y.o.   MRN: [id number]  HPI  50yoF presented in good spirits for 24-hour ambulatory blood pressure monitoring. She reports being diagnosed with hypertension approximately 20 years and that she cannot remember it ever being at goal. She is currently on quinapril, candesartan (ACE+ARB started at [facility] per pt), hydrochlorothiazide, metoprolol succinate. She was hospitalized in late October for intermittent chest pain without significant events. She has a follow-up appointment in January at [facility] for a stress test. She also acknowledges having uncontrolled diabetes (currently on glimeride and metformin) with a most recent A1C from October 2012 of 11.6%. When asked about considering starting insulin sometime in the future, she reports that she had it in the hospital and was ok with it then so would consider starting insulin if needed.  Patient gets some medications from [facility], some from [facility], and some from [facility] programs.  Patient had no significant complaints with the 24-hr ABPM study and reported that the day was like any other day.  Current Antihypertensive Regimen: Quinapril 40mg  at bedtime, Candesartan 32mg  QAM, Toprol XL 200mg  QAM, Hydrochlorothiazide 50mg  QAM  Other antihypertensives tried in the past: has tried amlodipine but does not remember reason for discontinuation.  Review of Systems     Objective:   Physical Exam  Filed Vitals:   01/17/11 1016  BP: 143/76  Pulse: 66   Filed Vitals:   01/17/11 1016  Height: 5\' 8"  (1.727 m)  Weight: 283 lb 14.4 oz (128.776 kg)   Body mass index is 43.17 kg/(m^2).   BMET    Component Value Date/Time   NA 140 11/30/2010 1025   K 3.4* 11/30/2010 1025   CL 101 11/30/2010 1025   CO2 28 11/30/2010 1025   GLUCOSE 257* 11/30/2010 1025   BUN 14 11/30/2010 1025   CREATININE 0.83 11/30/2010 1025   CREATININE 1.11 04/07/2010 1036   CALCIUM 9.9 11/30/2010  1025   GFRNONAA 80* 11/30/2010 1025   GFRAA >90 11/30/2010 1025    24-ABPM Study Data:  Arm Placement: left arm   Overall Mean 24hr BP:  181/107 mmHg HR: 70   Daytime Mean BP:   185/109 mmHg HR: 71  Nighttime Mean BP:   166/96   mmHg HR: 69  Dipping Pattern:  Yes-- Sys: -10.1%, Dia: -12.0%      [normal dipping ~10-20%]      Assessment & Plan:   50yoF with resistant hypertension, not controlled on current regimen of Quinapril 40mg  daily, Candesartan 32mg  daily, hydrochlorothiazide 50mg  daily, and metoprolol succinate 200mg  daily. Overall mean 24-hr BP 181/107 with dipping pattern present. Discussed with Dr. Mikel Cella to start amlodipine 5mg  daily. Patient will follow-up with Dr. Mikel Cella at the beginning of January. Considerations for additional treatment include starting spironolactone (check potassium before starting and 1 week after; patient also on K-Dur daily), switching metoprolol to carvedilol, and decreasing hydrochlorothiazide from 50mg  to 25mg  daily.    Patient seen by Benjaman Pott, PharmD resident.

## 2011-01-21 NOTE — Progress Notes (Signed)
  Subjective:    Patient ID: Kathryn Crosby, female    DOB: Nov 16, 1960, 50 y.o.   MRN: [id number]  HPI Reviewed and agree with Dr. Macky Lower management    Review of Systems     Objective:   Physical Exam        Assessment & Plan:

## 2011-01-21 NOTE — Assessment & Plan Note (Signed)
50yoF with resistant hypertension, not controlled on current regimen of Quinapril 40mg  daily, Candesartan 32mg  daily, hydrochlorothiazide 50mg  daily, and metoprolol succinate 200mg  daily. Overall mean 24-hr BP 181/107 with dipping pattern present. Discussed with Dr. Mikel Cella to start amlodipine 5mg  daily. Patient will follow-up with Dr. Mikel Cella at the beginning of January. Considerations for additional treatment include starting spironolactone (check potassium before starting and 1 week after; patient also on K-Dur daily), switching metoprolol to carvedilol, and decreasing hydrochlorothiazide from 50mg  to 25mg  daily.    Patient seen by Benjaman Pott, PharmD resident.

## 2011-02-04 ENCOUNTER — Ambulatory Visit (INDEPENDENT_AMBULATORY_CARE_PROVIDER_SITE_OTHER): Payer: Self-pay | Admitting: Family Medicine

## 2011-02-04 ENCOUNTER — Encounter: Payer: Self-pay | Admitting: Internal Medicine

## 2011-02-04 ENCOUNTER — Encounter: Payer: Self-pay | Admitting: Family Medicine

## 2011-02-04 VITALS — BP 121/82 | HR 79 | Temp 98.1°F | Ht 68.0 in | Wt 274.0 lb

## 2011-02-04 DIAGNOSIS — E119 Type 2 diabetes mellitus without complications: Secondary | ICD-10-CM

## 2011-02-04 DIAGNOSIS — I10 Essential (primary) hypertension: Secondary | ICD-10-CM

## 2011-02-04 NOTE — Progress Notes (Signed)
Subjective:     Patient ID: Kathryn Crosby, female   DOB: 05/25/1960, 51 y.o.   MRN: [id number]  HPI  Patient is a 51 yo F returning to clinic today for a follow up:  1. HTN- Patient recently did ambulatory blood pressure monitoring through the clinic pharmacy staff. It was noted that her BP was 108/107 on average. Following the monitoring, she was started on Norvasc 5mg  daily. Patient began taking this medication in mid-December. Today, her blood pressure was 121/81. She states she feels good. She denies headaches, changes in vision, chest pain, shortness of breath. Patient is doing well on current regimen. Pharmacy also saw patient today while she was in the office as a follow-up to her blood pressure. I greatly appreciate their help in the management of Ms. [name]'s blood pressure. Of note, patient is also going to see the cardiologist for a stress test next week.   2. DM- Pt's last A1C was 11.6. She states she checks her blood sugar 3-4 times/week and it is usually high. The lowest she has seen it is 176. Repeat A1C 10.5 today. She is on Amaryl 4mg  daily and Metformin 1000mg  BID. She has been walking some and has been trying to watch what she is eating (her daughter has been monitoring her portions.) She denies anytime when her sugars are low. No sweating, nausea, weakness.   Review of Systems Negative except as stated in HPI     Objective:   Physical Exam  Constitutional: She appears well-developed. No distress.  Cardiovascular: Normal rate, regular rhythm and normal heart sounds.   No murmur heard. Pulmonary/Chest: Effort normal and breath sounds normal. She has no wheezes.  Abdominal: Soft. There is no tenderness.       Assessment:     51 yo F with HTN and DM presenting for follow-up    Plan:

## 2011-02-04 NOTE — Patient Instructions (Addendum)
It was nice to see you today!  Congratulations on getting your blood pressure to goal! I am so glad you are feeling better!  Your Hemoglobin A1C is 10.5 today, which is better than it was in October! We are heading the right direction.  Dr. Gerilyn Pilgrim will call you to arrange a time to come see her to talk about your diet. I think that will be very helpful for you. For now, we are not going to change any of your medications.  I think walking is great for you! I hope you keep it up!  Please come back to see me in about 6 weeks for a check up on your blood pressure and your diabetes.  Take care! Call if you need anything! Felicite Zeimet M. Magdalena Skilton, M.D.

## 2011-02-05 ENCOUNTER — Encounter: Payer: Self-pay | Admitting: Family Medicine

## 2011-02-05 NOTE — Assessment & Plan Note (Signed)
Patient completed ambulatory BP monitoring and was found to be high all the time. Started on Norvasc and patient's blood pressure is at goal today. She states she feels better. Will continue current regimen and see her back in 2 months for another BP check. Patient is on ACE and ARB, so will consider stopping one of these. Other options for change would be to decrease HCTZ and/or add Spironolactone. I am happy with her improvement and I encourage her to continue taking medications and walking.

## 2011-02-05 NOTE — Assessment & Plan Note (Signed)
A1c 10.5 today which is slightly lower than 3 months ago. Patient is exercising, and tries to be more aware of what she is eating, but I feel she will benefit from a nutrition appointment with Dr. Gerilyn Pilgrim. Patient agrees that this would be helpful to help her remember what she should/should not eat as well as portion control. Once patient has met with Dr. Gerilyn Pilgrim, we will then address medication changes. She was on insulin the hospital but seems somewhat hesitant to start that now. At her next appointment, we will discuss options for change. She will be recording her blood sugar and will hopefully bring that to her next appointment.

## 2011-02-08 ENCOUNTER — Other Ambulatory Visit: Payer: Self-pay | Admitting: Family Medicine

## 2011-02-08 ENCOUNTER — Ambulatory Visit (INDEPENDENT_AMBULATORY_CARE_PROVIDER_SITE_OTHER): Payer: Self-pay | Admitting: Cardiology

## 2011-02-08 ENCOUNTER — Encounter: Payer: Self-pay | Admitting: Cardiology

## 2011-02-08 ENCOUNTER — Telehealth: Payer: Self-pay | Admitting: Family Medicine

## 2011-02-08 ENCOUNTER — Telehealth: Payer: Self-pay | Admitting: *Deleted

## 2011-02-08 DIAGNOSIS — R079 Chest pain, unspecified: Secondary | ICD-10-CM

## 2011-02-08 DIAGNOSIS — E785 Hyperlipidemia, unspecified: Secondary | ICD-10-CM

## 2011-02-08 DIAGNOSIS — E119 Type 2 diabetes mellitus without complications: Secondary | ICD-10-CM

## 2011-02-08 DIAGNOSIS — I10 Essential (primary) hypertension: Secondary | ICD-10-CM

## 2011-02-08 NOTE — Patient Instructions (Signed)
Your physician recommends that you schedule a follow-up appointment in: AS NEEDED  Your physician has requested that you have a lexiscan myoview. For further information please visit [email or web address]. Please follow instruction sheet, as given.    

## 2011-02-08 NOTE — Assessment & Plan Note (Signed)
Symptoms atypical and may be musculoskeletal. Plan Myoview for risk stratification given multiple risk factors.

## 2011-02-08 NOTE — Telephone Encounter (Signed)
Opened in error. Kathryn Crosby, Kathryn Crosby  

## 2011-02-08 NOTE — Assessment & Plan Note (Signed)
Blood pressure controlled. Continue present medications. Potassium and renal function monitored by primary care. 

## 2011-02-08 NOTE — Progress Notes (Signed)
HPI: 51 year old female with no prior cardiac history for evaluation of chest pain. Admitted in October of 2012 with chest pain and ruled out. Outpatient functional study recommended. Patient states that she has had intermittent chest pain for approximately one year. It is in the left chest area. It is described as a stabbing pain. There is diaphoresis but no shortness of breath or nausea. The pain is not pleuritic or exertional. It does occasionally, with certain movements. It lasts approximately 15 minutes and resolves spontaneously. She has had no pain since October. There is mild dyspnea on exertion but no orthopnea or PND. She occasionally has pedal edema. Because of her chest pain we were asked to further evaluate.  Current Outpatient Prescriptions  Medication Sig Dispense Refill  . Alpha-Lipoic Acid 100 MG CAPS Take 1 capsule by mouth daily.        Marland Kitchen amLODipine (NORVASC) 5 MG tablet Take 1 tablet (5 mg total) by mouth daily.  90 tablet  3  . candesartan ([name]) 32 MG tablet Take 1 tablet (32 mg total) by mouth daily.  30 tablet  5  . esomeprazole (NEXIUM) 40 MG capsule Take 1 capsule (40 mg total) by mouth daily.  30 capsule  3  . fluconazole (DIFLUCAN) 100 MG tablet Take 100 mg by mouth daily. PRN for yeast infection       . glimepiride (AMARYL) 2 MG tablet Take 2 tablets (4 mg total) by mouth daily before breakfast.  60 tablet  5  . hydrochlorothiazide (HYDRODIURIL) 25 MG tablet Take 50 mg by mouth daily.        Marland Kitchen ibuprofen (ADVIL,MOTRIN) 600 MG tablet Take 1 tablet (600 mg total) by mouth every 6 (six) hours as needed for pain.  60 tablet  5  . metFORMIN (GLUCOPHAGE) 1000 MG tablet Take 1 tablet (1,000 mg total) by mouth 2 (two) times daily with a meal.  60 tablet  5  . metoprolol (TOPROL-XL) 200 MG 24 hr tablet Take 1 tablet (200 mg total) by mouth daily.  30 tablet  5  . potassium chloride SA (K-DUR,KLOR-CON) 20 MEQ tablet Take 1 tablet (20 mEq total) by mouth daily.  30 tablet  5  .  quinapril (ACCUPRIL) 40 MG tablet Take 1 tablet (40 mg total) by mouth at bedtime.  90 tablet  1  . rosuvastatin (CRESTOR) 10 MG tablet Take 1 tablet (10 mg total) by mouth daily.  90 tablet  3   Current Facility-Administered Medications  Medication Dose Route Frequency Provider Last Rate Last Dose  . triamcinolone acetonide (KENALOG) 10 MG/ML injection 10 mg  10 mg Intra-articular Once Rodman Pickle, MD        Allergies  Allergen Reactions  . Shellfish-Derived Products Hives and Shortness Of Breath  . Aspirin Hives  . Ivp Dye (Iodinated Diagnostic Agents) Hives    Past Medical History  Diagnosis Date  . Diabetes mellitus   . Hypertension   . Hyperlipidemia   . Nephrolithiasis   . GERD (gastroesophageal reflux disease)     Past Surgical History  Procedure Date  . Abdominal hysterectomy   . Tonsillectomy   . Left ankle fracture   . Lithotripsy     History   Social History  . Marital Status: Single    Spouse Name: N/A    Number of Children: 3  . Years of Education: N/A   Occupational History  .     Social History Main Topics  . Smoking status: Never Smoker   .  Smokeless tobacco: Never Used  . Alcohol Use: No  . Drug Use: Not on file  . Sexually Active: Not on file   Other Topics Concern  . Not on file   Social History Narrative  . No narrative on file    Family History  Problem Relation Age of Onset  . Coronary artery disease Brother     CABG age 57    ROS: Some arthralgias but no fevers or chills, productive cough, hemoptysis, dysphasia, odynophagia, melena, hematochezia, dysuria, hematuria, rash, seizure activity, orthopnea, PND, pedal  claudication. Remaining systems are negative.  Physical Exam:  Blood pressure 124/86, pulse 79, height 5\' 8"  (1.727 m), weight 277 lb (125.646 kg).  General:  Well developed/obese in NAD Skin warm/dry Patient not depressed No peripheral clubbing Back-normal HEENT-normal/normal eyelids Neck supple/normal  carotid upstroke bilaterally; no bruits; no JVD; no thyromegaly chest - CTA/ normal expansion CV - RRR/normal S1 and S2; no murmurs, rubs or gallops;  PMI nondisplaced Abdomen -NT/ND, no HSM, no mass, + bowel sounds, no bruit 2+ femoral pulses, no bruits Ext- trace edema, no chords, 2+ DP Neuro-grossly nonfocal  ECG  normal sinus rhythm at a rate of 76. Left ventricular hypertrophy. Nonspecific T-wave changes.

## 2011-02-08 NOTE — Telephone Encounter (Signed)
Referral cancelled in EPIC. Mailed business card with Dr. Gerilyn Pilgrim number on it.  Will forward this message to Dr. Gerilyn Pilgrim so she is aware. Rayne Cowdrey, Maryjo [place]

## 2011-02-08 NOTE — Telephone Encounter (Signed)
Patient will need outpatient dietary counseling with Dr. Gerilyn Pilgrim in the [facility].   I apologize I did not specify this!  Thank you! Jammi Morrissette M. Daizha Anand, M.D.

## 2011-02-08 NOTE — Assessment & Plan Note (Signed)
Continue statin. Lipids and liver monitored by primary care. 

## 2011-02-16 ENCOUNTER — Ambulatory Visit (HOSPITAL_COMMUNITY): Payer: Self-pay | Attending: Internal Medicine | Admitting: Radiology

## 2011-02-16 ENCOUNTER — Other Ambulatory Visit (HOSPITAL_COMMUNITY): Payer: Self-pay | Admitting: Radiology

## 2011-02-16 DIAGNOSIS — R079 Chest pain, unspecified: Secondary | ICD-10-CM | POA: Insufficient documentation

## 2011-02-16 DIAGNOSIS — R0602 Shortness of breath: Secondary | ICD-10-CM

## 2011-02-16 DIAGNOSIS — R61 Generalized hyperhidrosis: Secondary | ICD-10-CM | POA: Insufficient documentation

## 2011-02-16 DIAGNOSIS — I10 Essential (primary) hypertension: Secondary | ICD-10-CM | POA: Insufficient documentation

## 2011-02-16 DIAGNOSIS — R0609 Other forms of dyspnea: Secondary | ICD-10-CM | POA: Insufficient documentation

## 2011-02-16 DIAGNOSIS — E785 Hyperlipidemia, unspecified: Secondary | ICD-10-CM | POA: Insufficient documentation

## 2011-02-16 DIAGNOSIS — E119 Type 2 diabetes mellitus without complications: Secondary | ICD-10-CM | POA: Insufficient documentation

## 2011-02-16 DIAGNOSIS — Z8249 Family history of ischemic heart disease and other diseases of the circulatory system: Secondary | ICD-10-CM | POA: Insufficient documentation

## 2011-02-16 DIAGNOSIS — R0989 Other specified symptoms and signs involving the circulatory and respiratory systems: Secondary | ICD-10-CM | POA: Insufficient documentation

## 2011-02-16 MED ORDER — REGADENOSON 0.4 MG/5ML IV SOLN
0.4000 mg | Freq: Once | INTRAVENOUS | Status: AC
  Administered 2011-02-16: 0.4 mg via INTRAVENOUS

## 2011-02-16 MED ORDER — TECHNETIUM TC 99M TETROFOSMIN IV KIT
30.0000 | PACK | Freq: Once | INTRAVENOUS | Status: AC | PRN
  Administered 2011-02-16: 30 via INTRAVENOUS

## 2011-02-16 NOTE — Progress Notes (Signed)
[facility] SITE 3 NUCLEAR MED [address] [place] [address] [phone]  Cardiology Nuclear Med Study  Kathryn Crosby is a 51 y.o. female [id number] 07/20/1960   Nuclear Med Background Indication for Stress Test:  Evaluation for Ischemia and 10/12 Post Hospital: Chest Pain, MI R/O History:  No previous documented CAD Cardiac Risk Factors: Family History - CAD, Hypertension, Lipids and NIDDM  Symptoms:  Chest Pain, Diaphoresis and DOE   Nuclear Pre-Procedure Caffeine/Decaff Intake:  None NPO After: 9:30pm   Lungs:  clear IV 0.9% NS with Angio Cath:  22g  IV Site: R Forearm  IV Started by:  Stanton Kidney, EMT-P  Chest Size (in):  44 Cup Size: DD  Height: 5\' 8"  (1.727 m)  Weight:  278 lb (126.1 kg)  BMI:  Body mass index is 42.27 kg/(m^2). Tech Comments:  Metoprolol held > 12 hours, per patient. CBG=127 @ 6:15 am, per patient.    Nuclear Med Study 1 or 2 day study: 2 day  Stress Test Type:  Treadmill/Lexiscan  Reading MD: Charlton Haws, MD  Order Authorizing Provider:  B.Crenshaw  Resting Radionuclide: Technetium 63m Tetrofosmin  Resting Radionuclide Dose: 33.0 mCi on 02/17/11   Stress Radionuclide:  Technetium 31m Tetrofosmin  Stress Radionuclide Dose: 33.0 mCi on 02/16/11           Stress Protocol Rest HR: 74 Stress HR: 109  Rest BP: 121/99 Stress BP: 148/101  Exercise Time (min): n/a METS: n/a   Predicted Max HR: 168 bpm % Max HR: 64.88 bpm Rate Pressure Product: 32440   Dose of Adenosine (mg):  n/a Dose of Lexiscan: 0.4 mg  Dose of Atropine (mg): n/a Dose of Dobutamine: n/a mcg/kg/min (at max HR)  Stress Test Technologist: Milana Na, EMT-P  Nuclear Technologist:  Doyne Keel, CNMT     Rest Procedure:  Myocardial perfusion imaging was performed at rest 45 minutes following the intravenous administration of Technetium 35m Tetrofosmin. Rest ECG: NSR  Stress Procedure:  The patient received IV Lexiscan 0.4 mg over 15-seconds with  concurrent low level exercise and then Technetium 37m Tetrofosmin was injected at 30-seconds while the patient continued walking one more minute.  There were no significant changes and fatigue with Lexiscan.  Quantitative spect images were obtained after a 45-minute delay. Stress ECG: No significant ST segment change suggestive of ischemia.  QPS Raw Data Images:  There is a breast shadow. Stress Images:  Normal homogeneous uptake in all areas of the myocardium. Rest Images:  Normal homogeneous uptake in all areas of the myocardium. Subtraction (SDS):  No evidence of ischemia. Transient Ischemic Dilatation (Normal <1.22):  1.10 Lung/Heart Ratio (Normal <0.45):  0.32  Quantitative Gated Spect Images QGS EDV:  103 ml QGS ESV:  39 ml QGS cine images:  NL LV Function; NL Wall Motion QGS EF: 62%  Impression Exercise Capacity:  Lexiscan with no exercise. BP Response:  Normal blood pressure response. Clinical Symptoms:  No chest pain. ECG Impression:  No significant ST segment change suggestive of ischemia. Comparison with Prior Nuclear Study: No images to compare  Overall Impression:  Normal stress nuclear study.   [name]

## 2011-02-17 ENCOUNTER — Ambulatory Visit (HOSPITAL_COMMUNITY): Payer: No Typology Code available for payment source | Attending: Cardiology | Admitting: Radiology

## 2011-02-17 DIAGNOSIS — R0989 Other specified symptoms and signs involving the circulatory and respiratory systems: Secondary | ICD-10-CM

## 2011-02-17 MED ORDER — TECHNETIUM TC 99M TETROFOSMIN IV KIT
33.0000 | PACK | Freq: Once | INTRAVENOUS | Status: AC | PRN
  Administered 2011-02-17: 33 via INTRAVENOUS

## 2011-02-21 ENCOUNTER — Encounter: Payer: Self-pay | Admitting: Family Medicine

## 2011-02-21 ENCOUNTER — Ambulatory Visit (INDEPENDENT_AMBULATORY_CARE_PROVIDER_SITE_OTHER): Payer: Self-pay | Admitting: Family Medicine

## 2011-02-21 VITALS — Ht 68.0 in | Wt 276.0 lb

## 2011-02-21 DIAGNOSIS — E1149 Type 2 diabetes mellitus with other diabetic neurological complication: Secondary | ICD-10-CM

## 2011-02-21 NOTE — Progress Notes (Signed)
Medical Nutrition Therapy:  Appt start time: 0900 end time:  1000.  Assessment:  Primary concerns today: Weight management and Blood sugar control.  Kathryn Crosby has been working at [employer] for the past year.  She has made some significant progress without regular exercise.  Usual eating pattern includes 2-3 meals and 2-3 snacks per day.  Restaurant or takeout meals are about 4 X wk.  Everyday foods include 24 oz juice, water, chx.  Avoided foods include none.   24-hr recall: B (11 AM)- 1 strip bacon, 1 slc bread w/ sharp cheddar, 1 c juice; L (2 PM)- 1/2 c brown rice, 1/2 c beef stew, 1/2 c green beans, water, unsweetened ice tea.  Atypical day yesterday in that Kathryn Crosby did not eat much.   Usual physical activity includes nothing consistent.  Kathryn Crosby identifies portion sz as problematic for her.   FBG have been 90s and 100s.  Kathryn Crosby attended one diabetes class at [facility]  Although she can identify sources of carbohydrate, she does not know much about carb counting.    Progress Towards Goal(s):  In progress.   Nutritional Diagnosis:  NB-2.1 Physical inactivity As related to poor motivation.  As evidenced by no regular exercise.    Intervention:  Nutrition education.  Monitoring/Evaluation:  Dietary intake, exercise, blood sugars, and body weight in 3 weeks.

## 2011-02-21 NOTE — Patient Instructions (Addendum)
-   Look at nutrition information for the restaurants you go to most frequently.  Pay attention to the sodium contents of foods.   - Continue to keep a log of all your blood sugar readings, and TRY TO BE CONSISTENT WITH THE TIMES YOU CHECK.  FOR EXAMPLE, ALWAYS CHECK AT THE SAME INTERVAL AFTER EATING, LIKE 2 HOURS OR 2 1/2 HOURS.   - At each meal, limit your starchy foods (carbohydrates) to TWO servings.  ONE SERVING = 1 slice of bread, 1 oz of baked food or snacks such as pretzels or crackers, 1/2 cup of scoopable carb's, or 15 grams of carb.   - Snacks:  Limit carb's to 1 per snack.   - Obtain twice as many veg's as protein or carbohydrate foods for both lunch and dinner. - Ask your cardiologist if it's to start walking.  Exercise goal:  Walk 30 min 5 X wk.  Keep a record of how many minutes you walk each day.   - In addition, look for exercise opportunities as you go through your day.   - Bring both your exercise and blood sugar records with you to your follow-up appt.   - Check out MyFitnessPal app.   - If Qs, call Dr. Gerilyn Pilgrim:  [phone].

## 2011-03-02 ENCOUNTER — Other Ambulatory Visit: Payer: Self-pay | Admitting: Family Medicine

## 2011-03-02 MED ORDER — AMLODIPINE BESYLATE 5 MG PO TABS: 5.0000 mg | ORAL_TABLET | Freq: Every day | ORAL | Status: DC

## 2011-03-08 ENCOUNTER — Encounter: Payer: Self-pay | Admitting: Internal Medicine

## 2011-03-14 ENCOUNTER — Ambulatory Visit: Payer: Self-pay | Admitting: Family Medicine

## 2011-04-28 ENCOUNTER — Other Ambulatory Visit: Payer: Self-pay | Admitting: Family Medicine

## 2011-04-28 MED ORDER — ESOMEPRAZOLE MAGNESIUM 40 MG PO CPDR: 40.0000 mg | DELAYED_RELEASE_CAPSULE | Freq: Every day | ORAL | Status: DC

## 2011-05-06 ENCOUNTER — Other Ambulatory Visit: Payer: Self-pay | Admitting: Family Medicine

## 2011-05-06 DIAGNOSIS — E785 Hyperlipidemia, unspecified: Secondary | ICD-10-CM

## 2011-05-06 MED ORDER — METFORMIN HCL 1000 MG PO TABS: 1000.0000 mg | ORAL_TABLET | Freq: Two times a day (BID) | ORAL | Status: DC

## 2011-05-06 MED ORDER — ROSUVASTATIN CALCIUM 10 MG PO TABS: 10.0000 mg | ORAL_TABLET | Freq: Every day | ORAL | Status: DC

## 2011-05-06 MED ORDER — GLIMEPIRIDE 2 MG PO TABS: 4.0000 mg | ORAL_TABLET | Freq: Every day | ORAL | Status: DC

## 2011-05-20 ENCOUNTER — Ambulatory Visit (INDEPENDENT_AMBULATORY_CARE_PROVIDER_SITE_OTHER): Payer: Medicaid Other | Admitting: Family Medicine

## 2011-05-20 ENCOUNTER — Encounter: Payer: Self-pay | Admitting: Family Medicine

## 2011-05-20 VITALS — BP 120/81 | HR 80 | Temp 98.4°F | Ht 68.0 in | Wt 274.0 lb

## 2011-05-20 DIAGNOSIS — S83509A Sprain of unspecified cruciate ligament of unspecified knee, initial encounter: Secondary | ICD-10-CM

## 2011-05-20 DIAGNOSIS — I10 Essential (primary) hypertension: Secondary | ICD-10-CM

## 2011-05-20 DIAGNOSIS — E119 Type 2 diabetes mellitus without complications: Secondary | ICD-10-CM

## 2011-05-20 DIAGNOSIS — M25569 Pain in unspecified knee: Secondary | ICD-10-CM

## 2011-05-20 LAB — POCT GLYCOSYLATED HEMOGLOBIN (HGB A1C): Hemoglobin A1C: 11

## 2011-05-20 MED ORDER — TRAMADOL HCL 50 MG PO TABS: 50.0000 mg | ORAL_TABLET | Freq: Three times a day (TID) | ORAL | Status: DC | PRN

## 2011-05-20 NOTE — Progress Notes (Signed)
Subjective:     Patient ID: Kathryn Crosby, female   DOB: 1960/12/12, 51 y.o.   MRN: [id number]  HPI Patient is a 51 yo F presenting for follow up visit.  1. HTN- Patient is doing well on all prescribed medications. Following her ambulatory blood pressure monitoring, patient's BP has remained stable. I am very pleased with her progress and congratulated her today! She denies headaches, CP, dizziness or changes in her vision. Patient has no concerns today.  2. Knee pain- Patient has an old ACL tear that she never had repaired. She states she occasionally will have pain in that knee. She taking ibuprofen and rests and it usually feels somewhat better. She does not want surgery, but is not opposed to a repeat sports medicine referral in the future. Patient's daily activities are somewhat limited by this pain. She is able to ambulate but only short distances.   3. DM- Patient currently on Metformin and Amaryl for diabetic control. States she checks her blood sugar 1-2 times per day. It is usually around 200 or between 200-300. She takes her medicines as prescribed. She does not diet or exercise. Patient denies any lows in her blood sugars. Also requesting a referral for an eye exam. Will do foot exam today.  Review of Systems  Constitutional: Negative for fever and chills.  HENT: Negative for congestion.   Eyes: Negative for visual disturbance.  Respiratory: Negative for cough and shortness of breath.   Cardiovascular: Negative for chest pain and leg swelling.  Gastrointestinal: Negative for abdominal pain.  Genitourinary: Negative for dysuria.  Musculoskeletal: Positive for arthralgias (knees). Negative for myalgias.  Skin: Negative for rash.  Neurological: Negative for headaches.       Objective:   Physical Exam  Constitutional: She appears well-developed and well-nourished.  HENT:  Head: Normocephalic and atraumatic.  Cardiovascular: Normal rate and regular rhythm.   Pulmonary/Chest:  Effort normal. She has no wheezes.  Abdominal: Soft.       Obese  Musculoskeletal:       Decreased ROM of both knees. No pain, erythema or tenderness to palpation.  Lymphadenopathy:    She has no cervical adenopathy.  Neurological: She is alert.  Skin: Skin is dry.       Assessment:     51 yo F presenting for follow-up appotintment    Plan:

## 2011-05-20 NOTE — Patient Instructions (Signed)
It was good to see you today!  I am so proud of your blood pressure and your weight!  Please make an appointment to see Dr. Raymondo Band next Tuesday at 9:00am to discuss your diabetes.  I have called a prescription to [facility] for Tramadol. You can take it as needed for pain in your knee.  Let me know if you need anything else! I will see you in 3 months!  Akash Winski M. Lynnette Pote, M.D.

## 2011-05-21 NOTE — Assessment & Plan Note (Signed)
Patient is having some pain. Encouraged her to continue ibuprofen. She does not want surgery. Will also prescribe Tramadol to take for the pain. We discussed that narcotics are not appropriate treatment for this pain, and she agrees.

## 2011-05-21 NOTE — Assessment & Plan Note (Signed)
Patient's blood pressure is great! She is stable on all medications and feels well. No headaches, chest pain, leg swelling. I am very pleased with her progress. Continue all medications. Refills sent in for her for 6 months. Follow up in 3 months, or sooner as needed.

## 2011-05-21 NOTE — Assessment & Plan Note (Signed)
Patient with history of DM II. She is currently on Metformin and Amaryl, without good control. Hemoglobin A1C was 11 today. (Last A1C was 10.5.) Patient is very hesitant to begin insulin regimen. WE discussed the health concerns associated with diabetes today and why we need to get her blood sugar more tightly controlled. Will ask patient to meet with Dr. Raymondo Band for diabetic education and to get more information about injectable medication. She is very willing to do this, and I think it will be beneficial. Patient is concerned because her disability insurance will be ending soon and she does not want to wait to get things started. Will follow up with Dr. Raymondo Band next week for education, and I have discussed this case with him.

## 2011-05-24 ENCOUNTER — Ambulatory Visit (INDEPENDENT_AMBULATORY_CARE_PROVIDER_SITE_OTHER): Payer: Medicaid Other | Admitting: Pharmacist

## 2011-05-24 ENCOUNTER — Encounter: Payer: Self-pay | Admitting: Pharmacist

## 2011-05-24 VITALS — BP 118/90 | HR 68 | Ht 68.0 in | Wt 274.1 lb

## 2011-05-24 DIAGNOSIS — E119 Type 2 diabetes mellitus without complications: Secondary | ICD-10-CM

## 2011-05-24 MED ORDER — INSULIN GLARGINE 100 UNIT/ML ~~LOC~~ SOLN: 12.0000 [IU] | Freq: Every day | SUBCUTANEOUS | Status: DC

## 2011-05-24 MED ORDER — "INSULIN SYRINGE 31G X 5/16"" 1 ML MISC": 1.0000 | Freq: Every day | Status: DC

## 2011-05-24 NOTE — Assessment & Plan Note (Signed)
Diabetes first documented in 03/2008 currently under poor control of blood glucose on oral agents, Metformin and Amaryl, based on most recent A1C values above 10. Her immediate - short-term A1C goal is <8% Lab Results  Component Value Date   HGBA1C 11.0 05/20/2011   Patient's CBG readings are consistently in 200s and 300s, trends are higher in the afternoons. These values are down from 6 months ago when BG readings were in the 400s. Since then she has made attempts to eat healthier and to begin exercising. Patient has a goal weight of 200 lbs by 09/2011. Patient experiences symptoms of hyperglycemia - polydipsia, polyphagia, and polyuria (15-20 bathroom visits overnight). Control is suboptimal due to insulin resistance, decreased insulin production, suboptimal dietary and exercise habits.   Patient denies hypoglycemic events.  Able to verbalize appropriate hypoglycemia management plan.  1. Will start basal insulin Lantus (insulin glargine) dose at 0.1 unit/kg, i.e. Lantus 12 units SQ qAM. Patient will continue to titrate 1 unit/day if fasting CBGs > 120mg /dl until fasting CBGs reach goal or next visit. Written patient instructions provided and hypoglycemia management plan was reviewed.   2. Continue Metformin 1000mg  PO BID 3. Continue Amaryl 4mg  PO qAM. May be able to decrease or discontinue this at next visit if insulin dose is sufficient. 4. Continue to avoid high-sugar foods and juices. 5. Follow up on weight trend (goal weight mentioned was 200 lbs by August 2013)   Follow up in  Pharmacist Clinic Visit 06/24/11.   Total time in face to face counseling 50 minutes.  Patient seen with Tana Conch, PharmD, Pharmacy Resident.

## 2011-05-24 NOTE — Progress Notes (Signed)
  Subjective:    Patient ID: Kathryn Crosby, female    DOB: November 05, 1960, 51 y.o.   MRN: [id number]  HPI 41 YOF with essential HTN, T2DM (first documented 03/2008), and dyslipidemia, comes to clinic this morning in good spirits for diabetes management and to initiate insulin therapy.  Review of Systems    Objective:   Physical Exam        Assessment & Plan:   Diabetes first documented in 03/2008 currently under poor control of blood glucose on oral agents, Metformin and Amaryl, based on most recent A1C values above 10. Her immediate - short-term A1C goal is <8% Lab Results  Component Value Date   HGBA1C 11.0 05/20/2011   Patient's CBG readings are consistently in 200s and 300s, trends are higher in the afternoons. These values are down from 6 months ago when BG readings were in the 400s. Since then she has made attempts to eat healthier and to begin exercising. Patient has a goal weight of 200 lbs by 09/2011. Patient experiences symptoms of hyperglycemia - polydipsia, polyphagia, and polyuria (15-20 bathroom visits overnight). Control is suboptimal due to insulin resistance, decreased insulin production, suboptimal dietary and exercise habits.   Patient denies hypoglycemic events.  Able to verbalize appropriate hypoglycemia management plan.  1. Will start basal insulin Lantus (insulin glargine) dose at 0.1 unit/kg, i.e. Lantus 12 units SQ qAM. Patient will continue to titrate 1 unit/day if fasting CBGs > 120mg /dl until fasting CBGs reach goal or next visit. Written patient instructions provided and hypoglycemia management plan was reviewed.   2. Continue Metformin 1000mg  PO BID 3. Continue Amaryl 4mg  PO qAM. May be able to decrease or discontinue this at next visit if insulin dose is sufficient. 4. Continue to avoid high-sugar foods and juices. 5. Follow up on weight trend (goal weight mentioned was 200 lbs by August 2013)   Follow up in  Pharmacist Clinic Visit 06/24/11.   Total time in  face to face counseling 50 minutes.  Patient seen with Tana Conch, PharmD, Pharmacy Resident.

## 2011-05-24 NOTE — Patient Instructions (Addendum)
Pleasure to meet with you today! This is our plan to manage your diabetes:  1. Start Lantus 12 units under the skin every morning. 2. Increase Lantus dose by 1 unit each day if morning sugar is >120 mg/dL. (see refrigerator magnet) 3. Continue taking Amaryl 4mg  every morning. 4. Continue taking Metformin 1000mg  by mouth twice daily. 5. Continue to avoid high-sugar foods and juices. 6. The goal weight you mentioned is 200 lbs by August 2013. You can do it! 7. Remember our plan if your sugars get too low below 70 mg/dL. (Wait 15 minutes and recheck sugars)

## 2011-05-24 NOTE — Progress Notes (Signed)
  Subjective:    Patient ID: Kathryn Crosby, female    DOB: 1960/11/29, 51 y.o.   MRN: [id number]  HPI Reviewed and agree with Dr. Macky Lower management.    Review of Systems     Objective:   Physical Exam        Assessment & Plan:

## 2011-06-24 ENCOUNTER — Ambulatory Visit: Payer: Medicaid Other | Admitting: Pharmacist

## 2011-06-24 ENCOUNTER — Encounter: Payer: Self-pay | Admitting: Family Medicine

## 2011-06-24 DIAGNOSIS — R079 Chest pain, unspecified: Secondary | ICD-10-CM | POA: Insufficient documentation

## 2011-06-28 ENCOUNTER — Ambulatory Visit (INDEPENDENT_AMBULATORY_CARE_PROVIDER_SITE_OTHER): Payer: Medicaid Other | Admitting: Pharmacist

## 2011-06-28 ENCOUNTER — Encounter: Payer: Self-pay | Admitting: Pharmacist

## 2011-06-28 VITALS — BP 137/99 | Ht 67.5 in | Wt 276.6 lb

## 2011-06-28 DIAGNOSIS — E119 Type 2 diabetes mellitus without complications: Secondary | ICD-10-CM

## 2011-06-28 LAB — BASIC METABOLIC PANEL
BUN: 18 mg/dL (ref 6–23)
Calcium: 8.9 mg/dL (ref 8.4–10.5)
Creat: 0.79 mg/dL (ref 0.50–1.10)

## 2011-06-28 MED ORDER — INSULIN GLARGINE 100 UNIT/ML ~~LOC~~ SOLN: 26.0000 [IU] | Freq: Every day | SUBCUTANEOUS | Status: DC

## 2011-06-28 MED ORDER — ACCU-CHEK FASTCLIX LANCETS MISC: 1.0000 | Freq: Two times a day (BID) | Status: DC

## 2011-06-28 MED ORDER — GLUCOSE BLOOD VI STRP: ORAL_STRIP | Status: DC

## 2011-06-28 MED ORDER — ACCU-CHEK NANO SMARTVIEW W/DEVICE KIT: 1.0000 | PACK | Freq: Once | Status: DC

## 2011-06-28 NOTE — Progress Notes (Signed)
  Subjective:    Patient ID: Kathryn Crosby, female    DOB: 21-May-1960, 51 y.o.   MRN: [id number]  HPI Patient returns this morning for follow up on her diabetes management after initiation of insulin therapy at the last appointment. States she is doing well and is in good spirits. Fasting blood glucose levels as reported by patient to be in the "low 200s" after starting lantus last month and increasing gradually up to 20 units. She reports improved symptoms as well including: improved vision, nocturia, polydipsia, peripheral neuropathy, and energy levels. She continues to take metformin and glimepiride.   Review of Systems     Objective:   Physical Exam        Assessment & Plan:   Diabetes of many yrs duration currently under poor control of blood glucose but imrpoving based on reported fasting blood glucose levels and symptom improvement. Lab Results  Component Value Date   HGBA1C 11.0 05/20/2011     ,home fasting CBG readings of 193-230s. Control is suboptimal due to diet and slow insulin titration. Denies hypoglycemic events.  Able to verbalize appropriate hypoglycemia management plan. Increased dose of basal insulin Lantus (insulin glargine) to 26 units daily in the morning. Written patient instructions provided. Counseled on increasing exercising and focusing on diet plan.  Will check BMET at this time. Patient to see Dr. Leanna Sato on 6/4 and consider change in Lantus to 30 or 32 units QAM at that time.  Follow up in Pharmacist Clinic Visit in July.  Total time in face to face counseling 30 minutes.  Patient seen with Melynda Ripple, PharmD. Candidate and Janace Litten, PharmD, Pharmacy Resident. Marland Kitchen

## 2011-06-28 NOTE — Progress Notes (Signed)
Patient ID: Kathryn Crosby, female   DOB: 10-16-1960, 51 y.o.   MRN: [id number] Reviewed and agree with Dr. Macky Lower management.

## 2011-06-28 NOTE — Patient Instructions (Addendum)
Good to see you this morning! Great job with the insulin and getting your glucose readings down - Increase Lantus insulin to 26 units daily in the morning - Continue Metformin 1 tablet twice daily - Continue Amaryl 2 tablets daily before breakfast - Focus on exercising (walking 30 minutes a day) and dieting  - Start using new glucose meter and test strips and instructed today Follow up with Dr. Everlene Balls next week on 6/4 Return for Pharmacy Clinic Visit in July

## 2011-06-28 NOTE — Assessment & Plan Note (Signed)
Diabetes of many yrs duration currently under poor control of blood glucose but imrpoving based on reported fasting blood glucose levels and symptom improvement. Lab Results  Component Value Date   HGBA1C 11.0 05/20/2011     ,home fasting CBG readings of 193-230s. Control is suboptimal due to diet and slow insulin titration. Denies hypoglycemic events.  Able to verbalize appropriate hypoglycemia management plan. Increased dose of basal insulin Lantus (insulin glargine) to 26 units daily in the morning. Written patient instructions provided. Counseled on increasing exercising and focusing on diet plan.  Will check BMET at this time. Patient to see Dr. Leanna Sato on 6/4 and consider change in Lantus to 30 or 32 units QAM at that time.  Follow up in Pharmacist Clinic Visit in July.  Total time in face to face counseling 30 minutes.  Patient seen with Melynda Ripple, PharmD. Candidate and Janace Litten, PharmD, Pharmacy Resident.

## 2011-06-29 ENCOUNTER — Other Ambulatory Visit: Payer: Self-pay | Admitting: *Deleted

## 2011-06-29 DIAGNOSIS — E119 Type 2 diabetes mellitus without complications: Secondary | ICD-10-CM

## 2011-06-29 MED ORDER — GLUCOSE BLOOD VI STRP: ORAL_STRIP | Status: DC

## 2011-07-04 ENCOUNTER — Other Ambulatory Visit: Payer: Self-pay | Admitting: Family Medicine

## 2011-07-04 ENCOUNTER — Other Ambulatory Visit: Payer: Self-pay | Admitting: *Deleted

## 2011-07-04 DIAGNOSIS — E119 Type 2 diabetes mellitus without complications: Secondary | ICD-10-CM

## 2011-07-04 MED ORDER — GLUCOSE BLOOD VI STRP: ORAL_STRIP | Status: DC

## 2011-07-04 NOTE — Telephone Encounter (Signed)
Addended by: Orvil Feil L on: 07/04/2011 08:33 AM   Modules accepted: Orders

## 2011-07-05 ENCOUNTER — Ambulatory Visit (INDEPENDENT_AMBULATORY_CARE_PROVIDER_SITE_OTHER): Payer: Medicaid Other | Admitting: Family Medicine

## 2011-07-05 ENCOUNTER — Encounter: Payer: Self-pay | Admitting: Family Medicine

## 2011-07-05 VITALS — BP 117/80 | HR 83 | Temp 98.3°F | Ht 67.5 in | Wt 275.0 lb

## 2011-07-05 DIAGNOSIS — E119 Type 2 diabetes mellitus without complications: Secondary | ICD-10-CM

## 2011-07-05 DIAGNOSIS — I10 Essential (primary) hypertension: Secondary | ICD-10-CM

## 2011-07-05 DIAGNOSIS — M25569 Pain in unspecified knee: Secondary | ICD-10-CM

## 2011-07-05 MED ORDER — CANDESARTAN CILEXETIL 32 MG PO TABS: 32.0000 mg | ORAL_TABLET | Freq: Every day | ORAL | Status: DC

## 2011-07-05 MED ORDER — QUINAPRIL HCL 40 MG PO TABS: 40.0000 mg | ORAL_TABLET | Freq: Every day | ORAL | Status: DC

## 2011-07-05 NOTE — Assessment & Plan Note (Signed)
Chronic pain to left knee, seen by [facility] clinic. Now with new injury to right knee. Will refer to ortho for further evaluation of pain. This is patient preference. For now, continue Ibuprofen for inflammation and exercise as much as she can.

## 2011-07-05 NOTE — Assessment & Plan Note (Signed)
Patient doing much better now that she is on Lantus. She is also pleased with how she feels and the new regimen. She has not been checking her blood sugars since she has not had her meter, but she will pick up strips today and begin checking. If she is consisently above 180, she will increase Lantus to 30U every morning. She agrees with this. She will continue exercising and follow up with Dr. Raymondo Band in July, and me in August.

## 2011-07-05 NOTE — Progress Notes (Signed)
  Subjective:    Patient ID: Kathryn Crosby, female    DOB: [birth year], 51 y.o.   MRN: [id number]  HPI Patient is a 51 yo F presenting for follow up of chronic medical problems:  1. DM- patient seen in pharmacy clinic and started on Lantus qam. Patient doing well with this. States overall she feels better. No problems giving the shots. Of note, her meter strips were not dispensed and she has not checked her blood sugar at home in the last week. She states she can pick up strips today and will start checking her blood sugars again. Dr. Macky Lower last note recommended an increase in insulin to 30 units qam. She also has been able to walk 30 mins/day. She continues to take Amaryl and Metformin. She will need a follow up with Dr. Raymondo Band next month.  2. HTN- Pt's BP under excellent control. She states it was high at last visit due to stress but overall she states it has been at goal. She denies headache, blurred vision, chest pain, leg edema. She does need refills on her [name] today. I am very pleased with her blood pressures!  3. Knee pain- Patient has chronic pain of the left knee. On Mother's Day weekend she was at her son's house and her left knee gave out. This caused her to fall and injure her right knee. She denies any syncope, dizziness or trips which caused her fall. She states her legs simply gave out. Since the fall approx 1 month ago, her right knee has been stiff and making it difficult to get around. She is requesting a referral to Ortho given bilateral knee pain which makes it harder for her to exercise which she needs for her chronic medical problems. Denies joint redness, fevers, popping/clicking. States she had a superficial abrasion which has healed.  Smoking history reviewed: Never smoker  Review of Systems  Constitutional: Negative for fever and chills.  Eyes: Negative for visual disturbance.  Respiratory: Negative for cough, chest tightness and shortness of breath.   Cardiovascular:  Negative for chest pain.  Gastrointestinal: Negative for abdominal pain.  Musculoskeletal: Positive for arthralgias and gait problem.  Skin: Negative for rash.  Neurological: Negative for dizziness, light-headedness and headaches.      Objective:   Physical Exam  Constitutional: She appears well-developed and well-nourished. No distress.  HENT:  Head: Normocephalic and atraumatic.  Cardiovascular: Normal rate, regular rhythm and normal heart sounds.   Pulmonary/Chest: Effort normal and breath sounds normal.  Abdominal: Soft. There is no tenderness.  Musculoskeletal:       Decreased ROM of knees bilaterally. Pain with ROM. ?swelling of knees (vs body habitus) No crepitus appreciated.  Mild pitting edema of lower extremities  Skin: Skin is warm and dry.      Assessment & Plan:

## 2011-07-05 NOTE — Assessment & Plan Note (Signed)
Excellent control. Labs recently checked. Continue current regimen. [facility] BMET    Component Value Date/Time   NA 141 06/28/2011 1134   K 3.2* 06/28/2011 1134   CL 103 06/28/2011 1134   CO2 28 06/28/2011 1134   GLUCOSE 200* 06/28/2011 1134   BUN 18 06/28/2011 1134   CREATININE 0.79 06/28/2011 1134   CREATININE 0.83 11/30/2010 1025   CALCIUM 8.9 06/28/2011 1134   GFRNONAA 80* 11/30/2010 1025   GFRAA >90 11/30/2010 1025

## 2011-07-05 NOTE — Patient Instructions (Signed)
I am so glad you are doing well! I am also glad you had the opportunity to meet with Dr. Raymondo Band and start on Lantus. Once you start checking your blood sugar again, if it is >180, increase Lantus to 30 units every morning.  I will also put in a referral to ortho and they will call you with the appointment. I have called in your refills.  Please come back to see Dr. Raymondo Band in July for your diabetes, and see me in August.  Have a great day! Sheri Prows M. Adasha Boehme, M.D.

## 2011-07-06 ENCOUNTER — Other Ambulatory Visit: Payer: Self-pay | Admitting: *Deleted

## 2011-07-06 MED ORDER — CANDESARTAN CILEXETIL 32 MG PO TABS: 32.0000 mg | ORAL_TABLET | Freq: Every day | ORAL | Status: DC

## 2011-07-06 NOTE — Telephone Encounter (Signed)
Addended by: Orvil Feil L on: 07/06/2011 11:36 AM   Modules accepted: Orders

## 2011-07-06 NOTE — Progress Notes (Signed)
Addended by: Hilarie Fredrickson on: 07/06/2011 08:09 AM   Modules accepted: Orders

## 2011-07-06 NOTE — Telephone Encounter (Signed)
The [facility]  [facility] will not be able to give patient [name] any longer because she now has medicaid.  Sent electronically to [facility] .  Patient notified.

## 2011-07-07 ENCOUNTER — Other Ambulatory Visit: Payer: Self-pay | Admitting: Family Medicine

## 2011-07-07 DIAGNOSIS — I10 Essential (primary) hypertension: Secondary | ICD-10-CM

## 2011-07-07 MED ORDER — LOSARTAN POTASSIUM 50 MG PO TABS: 50.0000 mg | ORAL_TABLET | Freq: Every day | ORAL | Status: DC

## 2011-07-07 NOTE — Assessment & Plan Note (Signed)
Losartan substituted for candesartan since losartan is medicaid approved.

## 2011-07-26 ENCOUNTER — Encounter (HOSPITAL_COMMUNITY): Payer: Self-pay | Admitting: Adult Health

## 2011-07-26 DIAGNOSIS — N390 Urinary tract infection, site not specified: Secondary | ICD-10-CM | POA: Insufficient documentation

## 2011-07-26 DIAGNOSIS — K219 Gastro-esophageal reflux disease without esophagitis: Secondary | ICD-10-CM | POA: Insufficient documentation

## 2011-07-26 DIAGNOSIS — Z794 Long term (current) use of insulin: Secondary | ICD-10-CM | POA: Insufficient documentation

## 2011-07-26 DIAGNOSIS — R109 Unspecified abdominal pain: Secondary | ICD-10-CM | POA: Insufficient documentation

## 2011-07-26 DIAGNOSIS — I10 Essential (primary) hypertension: Secondary | ICD-10-CM | POA: Insufficient documentation

## 2011-07-26 DIAGNOSIS — E119 Type 2 diabetes mellitus without complications: Secondary | ICD-10-CM | POA: Insufficient documentation

## 2011-07-26 DIAGNOSIS — M549 Dorsalgia, unspecified: Secondary | ICD-10-CM | POA: Insufficient documentation

## 2011-07-26 DIAGNOSIS — E785 Hyperlipidemia, unspecified: Secondary | ICD-10-CM | POA: Insufficient documentation

## 2011-07-26 DIAGNOSIS — Z79899 Other long term (current) drug therapy: Secondary | ICD-10-CM | POA: Insufficient documentation

## 2011-07-26 DIAGNOSIS — E669 Obesity, unspecified: Secondary | ICD-10-CM | POA: Insufficient documentation

## 2011-07-26 LAB — URINALYSIS, ROUTINE W REFLEX MICROSCOPIC
Glucose, UA: NEGATIVE mg/dL
Hgb urine dipstick: NEGATIVE
Ketones, ur: NEGATIVE mg/dL
Protein, ur: NEGATIVE mg/dL

## 2011-07-26 LAB — URINE MICROSCOPIC-ADD ON

## 2011-07-26 NOTE — ED Notes (Signed)
C/o right flank pain that is worse with movement, coughing and touch began one week ago. Denies urinary frequency, denies hematuria, denies dysuria. C/o difficulty emptying bladder.

## 2011-07-27 ENCOUNTER — Emergency Department (HOSPITAL_COMMUNITY)
Admission: EM | Admit: 2011-07-27 | Discharge: 2011-07-27 | Disposition: A | Discharge status: Home / Self Care | Payer: Medicaid Other | Attending: Emergency Medicine | Admitting: Emergency Medicine

## 2011-07-27 ENCOUNTER — Emergency Department (HOSPITAL_COMMUNITY): Payer: Medicaid Other

## 2011-07-27 DIAGNOSIS — R109 Unspecified abdominal pain: Secondary | ICD-10-CM

## 2011-07-27 DIAGNOSIS — N390 Urinary tract infection, site not specified: Secondary | ICD-10-CM

## 2011-07-27 LAB — POCT I-STAT, CHEM 8
Calcium, Ion: 1.21 mmol/L (ref 1.12–1.32)
Glucose, Bld: 185 mg/dL — ABNORMAL HIGH (ref 70–99)
HCT: 40 % (ref 36.0–46.0)
Hemoglobin: 13.6 g/dL (ref 12.0–15.0)
Potassium: 3.2 mEq/L — ABNORMAL LOW (ref 3.5–5.1)

## 2011-07-27 LAB — CBC
HCT: 38.4 % (ref 36.0–46.0)
Hemoglobin: 12.5 g/dL (ref 12.0–15.0)
RBC: 4.65 MIL/uL (ref 3.87–5.11)
RDW: 14.5 % (ref 11.5–15.5)
WBC: 12.8 10*3/uL — ABNORMAL HIGH (ref 4.0–10.5)

## 2011-07-27 LAB — DIFFERENTIAL
Basophils Absolute: 0 10*3/uL (ref 0.0–0.1)
Basophils Relative: 0 % (ref 0–1)
Eosinophils Absolute: 0.2 10*3/uL (ref 0.0–0.7)
Monocytes Absolute: 0.8 10*3/uL (ref 0.1–1.0)
Monocytes Relative: 6 % (ref 3–12)
Neutrophils Relative %: 52 % (ref 43–77)

## 2011-07-27 MED ORDER — SULFAMETHOXAZOLE-TRIMETHOPRIM 800-160 MG PO TABS: 1.0000 | ORAL_TABLET | Freq: Two times a day (BID) | ORAL | Status: DC

## 2011-07-27 MED ORDER — OXYCODONE-ACETAMINOPHEN 5-325 MG PO TABS: 1.0000 | ORAL_TABLET | Freq: Four times a day (QID) | ORAL | Status: AC | PRN

## 2011-07-27 NOTE — ED Notes (Signed)
MD at bedside. 

## 2011-07-27 NOTE — ED Provider Notes (Signed)
History     CSN: [id number]  Arrival date & time 07/26/11  2212   First MD Initiated Contact with Patient 07/27/11 0104      Chief Complaint  Patient presents with  . Flank Pain    (Consider location/radiation/quality/duration/timing/severity/associated sxs/prior treatment) Patient is a 51 y.o. female presenting with flank pain. The history is provided by the patient.  Flank Pain Pertinent negatives include no chest pain, no abdominal pain, no headaches and no shortness of breath.   patient has had right flank pain worse with movement for the last week. She states she's had 48 previous kidney stones. She states this feels little different. She states she has had some difficulty emptying her bladder. No dysuria. No blood or urine. No fevers. No nausea or diarrhea. No cough. No rash. The pain is constant. There is no relief with ibuprofen. She hasn't taken any other medications.  Past Medical History  Diagnosis Date  . Diabetes mellitus   . Hypertension   . Hyperlipidemia   . Nephrolithiasis   . GERD (gastroesophageal reflux disease)     Past Surgical History  Procedure Date  . Abdominal hysterectomy   . Tonsillectomy   . Left ankle fracture   . Lithotripsy     Family History  Problem Relation Age of Onset  . Coronary artery disease Brother     CABG age 50    History  Substance Use Topics  . Smoking status: Never Smoker   . Smokeless tobacco: Never Used  . Alcohol Use: No    OB History    Grav Para Term Preterm Abortions TAB SAB Ect Mult Living                  Review of Systems  Constitutional: Negative for activity change and appetite change.  HENT: Negative for neck stiffness.   Eyes: Negative for pain.  Respiratory: Negative for chest tightness and shortness of breath.   Cardiovascular: Negative for chest pain and leg swelling.  Gastrointestinal: Negative for nausea, vomiting, abdominal pain and diarrhea.  Genitourinary: Positive for flank pain.    Musculoskeletal: Positive for back pain.  Skin: Negative for rash.  Neurological: Negative for weakness, numbness and headaches.  Psychiatric/Behavioral: Negative for behavioral problems.    Allergies  Aspirin; Ivp dye; and Shellfish-derived products  Home Medications   Current Outpatient Rx  Name Route Sig Dispense Refill  . ALPHA-LIPOIC ACID 100 MG PO CAPS Oral Take 1 capsule by mouth daily.      Marland Kitchen AMLODIPINE BESYLATE 5 MG PO TABS Oral Take 1 tablet (5 mg total) by mouth daily. 90 tablet 3  . ESOMEPRAZOLE MAGNESIUM 40 MG PO CPDR Oral Take 1 capsule (40 mg total) by mouth daily. 30 capsule 11  . GLIMEPIRIDE 2 MG PO TABS Oral Take 2 tablets (4 mg total) by mouth daily before breakfast. 60 tablet 5  . HYDROCHLOROTHIAZIDE 25 MG PO TABS Oral Take 25 mg by mouth daily.    . IBUPROFEN 600 MG PO TABS Oral Take 1 tablet (600 mg total) by mouth every 6 (six) hours as needed for pain. 60 tablet 5  . INSULIN GLARGINE 100 UNIT/ML [place] SOLN Subcutaneous Inject 26 Units into the skin daily.    Marland Kitchen LOSARTAN POTASSIUM 50 MG PO TABS Oral Take 1 tablet (50 mg total) by mouth daily. 90 tablet 3  . METFORMIN HCL 1000 MG PO TABS Oral Take 1 tablet (1,000 mg total) by mouth 2 (two) times daily with a meal. 60  tablet 5  . METOPROLOL SUCCINATE ER 200 MG PO TB24 Oral Take 1 tablet (200 mg total) by mouth daily. 30 tablet 5  . POTASSIUM CHLORIDE CRYS ER 20 MEQ PO TBCR Oral Take 20 mEq by mouth daily.     . QUINAPRIL HCL 40 MG PO TABS Oral Take 1 tablet (40 mg total) by mouth at bedtime. 90 tablet 3  . ROSUVASTATIN CALCIUM 10 MG PO TABS Oral Take 1 tablet (10 mg total) by mouth daily. 90 tablet 5  . ACCU-CHEK FASTCLIX LANCETS MISC Does not apply 1 strip by Does not apply route 2 (two) times daily. Quantity to dispense for at least two times a day. 1 each 12  . ACCU-CHEK NANO SMARTVIEW W/DEVICE KIT Does not apply 1 Device by Does not apply route once. Patient has coupon for free meter. Please wait for processing 1 kit  0  . GLUCOSE BLOOD VI STRP  Check twice daily  For diabetes 250.00 60 each 6  . INSULIN SYRINGE 31G X 5/16" 1 ML MISC Does not apply 1 Syringe by Does not apply route daily. Quantity sufficient for once daily dosing 1 each 11  . OXYCODONE-ACETAMINOPHEN 5-325 MG PO TABS Oral Take 1-2 tablets by mouth every 6 (six) hours as needed for pain. 10 tablet 0  . SULFAMETHOXAZOLE-TRIMETHOPRIM 800-160 MG PO TABS Oral Take 1 tablet by mouth 2 (two) times daily. 6 tablet 0    BP 132/87  Pulse 63  Temp 98.8 F (37.1 C) (Oral)  Resp 18  SpO2 100%  Physical Exam  Nursing note and vitals reviewed. Constitutional: She is oriented to person, place, and time. She appears well-developed and well-nourished.       Patient is obese  HENT:  Head: Normocephalic and atraumatic.  Eyes: EOM are normal. Pupils are equal, round, and reactive to light.  Neck: Normal range of motion. Neck supple.  Cardiovascular: Normal rate, regular rhythm and normal heart sounds.   No murmur heard. Pulmonary/Chest: Effort normal and breath sounds normal. No respiratory distress. She has no wheezes. She has no rales.  Abdominal: Soft. Bowel sounds are normal. She exhibits no distension. There is no tenderness. There is no rebound and no guarding.  Genitourinary:       No CVA tenderness. No rash. Patient appears uncomfortable with movements.  Musculoskeletal: Normal range of motion.  Neurological: She is alert and oriented to person, place, and time. No cranial nerve deficit.  Skin: Skin is warm and dry.  Psychiatric: She has a normal mood and affect. Her speech is normal.    ED Course  Procedures (including critical care time)  Labs Reviewed  URINALYSIS, ROUTINE W REFLEX MICROSCOPIC - Abnormal; Notable for the following:    APPearance HAZY (*)     Leukocytes, UA MODERATE (*)     All other components within normal limits  CBC - Abnormal; Notable for the following:    WBC 12.8 (*)     All other components within normal  limits  URINE MICROSCOPIC-ADD ON - Abnormal; Notable for the following:    Bacteria, UA FEW (*)     Casts HYALINE CASTS (*)     All other components within normal limits  DIFFERENTIAL - Abnormal; Notable for the following:    Lymphs Abs 5.1 (*)     All other components within normal limits  POCT I-STAT, CHEM 8 - Abnormal; Notable for the following:    Potassium 3.2 (*)     Glucose, Bld 185 (*)  All other components within normal limits   US Renal  07/27/2011  *RADIOLOGY REPORT*  Clinical Data: Right flank pain  RENAL/URINARY TRACT ULTRASOUND COMPLETE  Comparison:  CT 09/23/2009  Findings:  Right Kidney:  Right kidney measures 13 cm length.  Echogenic foci are demonstrated in the upper and lower poles, measuring five and 3 mm, respectively.  These are consistent with small stones.  No pyelocaliectasis.  Left Kidney:  The left kidney measures 12.5 cm length.  Normal homogeneous parenchymal echotexture.  No hydronephrosis.  Bladder:  The bladder is incompletely distended but no obvious bladder wall thickening or filling defects are demonstrated.  IMPRESSION: Small nonobstructing stones in the upper and lower pole of the right kidney.  Examination is otherwise unremarkable.  Original Report Authenticated By: Marlon Pel, M.D.     1. Flank pain   2. UTI (urinary tract infection)       MDM  Patient with flank pain. Some urinary symptoms. Patient has a mildly elevated white count. Mild hypokalemia. Urinalysis shows possible UTI. She has a history kidney stones, but no hydro-ultrasound. Pain is worse with movement. No CVA tenderness. Patient be treated for possible urinary tract infection and will followup with her primary care Dr. Urine culture has been sent.        Juliet Rude. Rubin Payor, MD 07/27/11 [phone]

## 2011-07-27 NOTE — Discharge Instructions (Signed)
Flank Pain Flank pain is pain in your side.  HOME CARE  Home care and treatment will depend on the cause of your pain.   Some medicines can help relieve the pain. Take all medicine as told by your doctor.   Tell your doctor about any changes in your pain.   Follow up with your doctor.  GET HELP RIGHT AWAY IF:   Your pain does not get better with medicine.   The pain gets worse.   You have belly (abdominal) pain.   You are short of breath.   You always feel sick to your stomach (nauseous).   You keep throwing up (vomiting).   You have puffiness (swelling) in your belly.   You pass out (faint).   You have a temperature by mouth above 102 F (38.9 C), not controlled by medicine.  MAKE SURE YOU:   Understand these instructions.   Will watch your condition.   Will get help right away if you are not doing well or get worse.  Document Released: 10/27/2007 Document Revised: 01/06/2011 Document Reviewed: 07/04/2009 [facility] Patient Information 2012 [place], [place].Urinary Tract Infection Infections of the urinary tract can start in several places. A bladder infection (cystitis), a kidney infection (pyelonephritis), and a prostate infection (prostatitis) are different types of urinary tract infections (UTIs). They usually get better if treated with medicines (antibiotics) that kill germs. Take all the medicine until it is gone. You or your child may feel better in a few days, but TAKE ALL MEDICINE or the infection may not respond and may become more difficult to treat. HOME CARE INSTRUCTIONS   Drink enough water and fluids to keep the urine clear or pale yellow. Cranberry juice is especially recommended, in addition to large amounts of water.   Avoid caffeine, tea, and carbonated beverages. They tend to irritate the bladder.   Alcohol may irritate the prostate.   Only take over-the-counter or prescription medicines for pain, discomfort, or fever as directed by your caregiver.    To prevent further infections:  Empty the bladder often. Avoid holding urine for long periods of time.   After a bowel movement, women should cleanse from front to back. Use each tissue only once.   Empty the bladder before and after sexual intercourse.  FINDING OUT THE RESULTS OF YOUR TEST Not all test results are available during your visit. If your or your child's test results are not back during the visit, make an appointment with your caregiver to find out the results. Do not assume everything is normal if you have not heard from your caregiver or the medical facility. It is important for you to follow up on all test results. SEEK MEDICAL CARE IF:   There is back pain.   Your baby is older than 3 months with a rectal temperature of 100.5 F (38.1 C) or higher for more than 1 day.   Your or your child's problems (symptoms) are no better in 3 days. Return sooner if you or your child is getting worse.  SEEK IMMEDIATE MEDICAL CARE IF:   There is severe back pain or lower abdominal pain.   You or your child develops chills.   You have a fever.   Your baby is older than 3 months with a rectal temperature of 102 F (38.9 C) or higher.   Your baby is 46 months old or younger with a rectal temperature of 100.4 F (38 C) or higher.   There is nausea or vomiting.  There is continued burning or discomfort with urination.  MAKE SURE YOU:   Understand these instructions.   Will watch your condition.   Will get help right away if you are not doing well or get worse.  Document Released: 10/27/2004 Document Revised: 01/06/2011 Document Reviewed: 06/01/2006 [facility] Patient Information 2012 [place], [place].

## 2011-08-03 ENCOUNTER — Ambulatory Visit (INDEPENDENT_AMBULATORY_CARE_PROVIDER_SITE_OTHER): Payer: Medicaid Other | Admitting: Family Medicine

## 2011-08-03 ENCOUNTER — Encounter: Payer: Self-pay | Admitting: Family Medicine

## 2011-08-03 VITALS — BP 127/85 | HR 73 | Ht 68.0 in | Wt 278.9 lb

## 2011-08-03 DIAGNOSIS — R300 Dysuria: Secondary | ICD-10-CM

## 2011-08-03 DIAGNOSIS — M549 Dorsalgia, unspecified: Secondary | ICD-10-CM | POA: Insufficient documentation

## 2011-08-03 NOTE — Progress Notes (Signed)
  Subjective:    Patient ID: Kathryn Crosby, female    DOB: Oct 02, 1960, 51 y.o.   MRN: [id number]  HPI Discussed and examined pt with MS3  Patient reports 2 weeks of right flank pain associated with dysuria.  Was treated with course of bactrim by ER, no culture obtained.  Dysuria mostly improved.  Does have some urinary freuency. States fasting glucose was 130 this morning.  Pain worse with movement, upon further questioning, fell out of bed a few days before onset of pain.  No fever, chills.  Has nausea last night (gets carsick) but otherwise has been well.  I have reviewed patient's  PMH, FH, and Social history and Medications as related to this visit. Sig fo DM, history of recurrent nephrolithiasis  Review of Systems see HPI   Objective:   Physical Exam GEN: Alert & Oriented, No acute distress CV:  Regular Rate & Rhythm, no murmur Respiratory:  Normal work of breathing, CTAB Abd:  + BS, soft, no tenderness to palpation.  Mild pain on right thoracic paraspinal area.   Ext: no pre-tibial edema        Assessment & Plan:

## 2011-08-03 NOTE — Patient Instructions (Addendum)
Will get urine tests to check for infection  Take tylenol and ibuprofen for muscle pain  If you notice worsening pain or fever, chills, please seek medical care

## 2011-08-03 NOTE — Assessment & Plan Note (Signed)
Patient has nephrolithiasis by [place] on right side (small- not l ikley symptomatic), but no appreciable flank pain on exam, not colicky pain.  Twisting motion reproduced it on todays' exam.  Pyelo less likely.    Most likely MSK, Tylenol, ibuprofen, discussed red flags for return  Discussed with patient getting urine sample and culture, she left before being discharged.  i called her and left her a phone message.

## 2011-08-03 NOTE — Progress Notes (Signed)
Subjective:     Patient ID: Kathryn Crosby, female   DOB: 03-15-1960, 51 y.o.   MRN: [id number]  HPI Kathryn Crosby is a 51 y/o female with a past medical history of DM, hypertension, hyperlipidemia, and nephrolithiasis who comes in with a 2 week history of back pain. She was seen in the ER on 07/27/2011 complaining of flank pain and dysuria and was diagnosed with a UTI. She says she was given a 3 day course of Bactrim at that time. She says she took the entire course of antibiotics prescribed to her. She states that the bactrim helped with the symptoms of dysuria, but it has not helped resolve her "back" pain.   She describes the pain as a sharp and states that it is exacerbated by movement. She says that the pain mostly localizes over the right side of her back, but it can radiate to the front when she moves or twists.   She describes a long history of nephrolithiasis, with having had more than 40 stones since the 1970s. She is adamant that this is NOT similar to any previous episode of nephrolithiasis that she has experienced.   She does report having falling out of her bed approximately three days before the pain started. She is not sure how she fell, but woke up when she hit the floor.   She does admit to an isolated episode of nausea and vomiting yesterday evening. She denies any fever or abdominal pain.   She denies dysuria, but admits that she has been experiencing frequency. She has been checking her sugars regularly, and they were around 130 this morning.    Review of Systems As Per Above     Objective:   Physical Exam Gen: Well appearing woman in no acute distress.  Pulm: Lungs clear to ausculation bilaterally CV: RRR, no murmurs rubs or gallops Abd: Abdomen is soft and non-tender. There is tenderness to palpation over the right flank. CVA tenderness also present on the right.   Diagnostic studies:  07/27/2011 RENAL/URINARY TRACT ULTRASOUND COMPLETE  Comparison: CT 09/23/2009    Findings:  Right Kidney: Right kidney measures 13 cm length. Echogenic foci  are demonstrated in the upper and lower poles, measuring five and 3  mm, respectively. These are consistent with small stones. No  pyelocaliectasis.  Left Kidney: The left kidney measures 12.5 cm length. Normal  homogeneous parenchymal echotexture. No hydronephrosis.  Bladder: The bladder is incompletely distended but no obvious  bladder wall thickening or filling defects are demonstrated.  IMPRESSION:  Small nonobstructing stones in the upper and lower pole of the  right kidney. Examination is otherwise unremarkable.    Assessment:   51 y/o woman with a history of nephrolithiasis and a recently diagnosed UTI presenting with persistent flank pain. This most likely represents a musculoskeletal process caused by the patients fall from bed. The absence of fever and constitutional symptoms, along with a recently completed course of Bactrim make the diagnosis of pyelonephritis less likely, although it is still a possibility. Given the patients experience with kidney stones, and a recent (07/27/11) renal US showing stones that would not normally cause symptoms (3mm and 5mm), it is unlikely that this pain represents nephrolithiasis.      Plan:   Will check a urinalysis and urine culture to rule out pyelonephritis. Patient advised to take ibuprofen and Tylenol as needed to control her pain, and to follow up in 1-2 weeks if she has not experienced any improvement in her symptoms at  that time.

## 2011-08-03 NOTE — Assessment & Plan Note (Addendum)
Patient left without giving urine sample

## 2011-08-05 ENCOUNTER — Other Ambulatory Visit: Payer: Self-pay | Admitting: Family Medicine

## 2011-08-05 ENCOUNTER — Telehealth: Payer: Self-pay | Admitting: *Deleted

## 2011-08-05 MED ORDER — OMEPRAZOLE 40 MG PO CPDR: 40.0000 mg | DELAYED_RELEASE_CAPSULE | Freq: Every day | ORAL | Status: DC

## 2011-08-05 NOTE — Telephone Encounter (Signed)
PA required for generic Nexium. Form placed in MD box.

## 2011-08-05 NOTE — Telephone Encounter (Signed)
Patient placed back on Prilosec. If this is not covered, will complete PA form for Nexium.  Please let her know [facility]   Thank you! Larenz Frasier M. Kathryn Crosby, M.D.

## 2011-08-08 MED ORDER — OMEPRAZOLE 40 MG PO CPDR: 40.0000 mg | DELAYED_RELEASE_CAPSULE | Freq: Every day | ORAL | Status: DC

## 2011-08-08 NOTE — Telephone Encounter (Signed)
Patient notified and she gets her meds for [facility]. RX for prilosec  Sent electronically there.

## 2011-08-22 ENCOUNTER — Other Ambulatory Visit: Payer: Self-pay | Admitting: Family Medicine

## 2011-08-22 DIAGNOSIS — Z1231 Encounter for screening mammogram for malignant neoplasm of breast: Secondary | ICD-10-CM

## 2011-08-30 ENCOUNTER — Ambulatory Visit: Payer: Medicaid Other | Admitting: Pharmacist

## 2011-09-08 ENCOUNTER — Ambulatory Visit (INDEPENDENT_AMBULATORY_CARE_PROVIDER_SITE_OTHER): Payer: Medicaid Other | Admitting: Pharmacist

## 2011-09-08 ENCOUNTER — Encounter: Payer: Self-pay | Admitting: Pharmacist

## 2011-09-08 VITALS — BP 135/87 | HR 74 | Ht 68.0 in | Wt 274.0 lb

## 2011-09-08 DIAGNOSIS — E119 Type 2 diabetes mellitus without complications: Secondary | ICD-10-CM

## 2011-09-08 MED ORDER — EXENATIDE 5 MCG/0.02ML ~~LOC~~ SOPN: 5.0000 ug | PEN_INJECTOR | Freq: Two times a day (BID) | SUBCUTANEOUS | Status: DC

## 2011-09-08 MED ORDER — INSULIN PEN NEEDLE 30G X 8 MM MISC: 1.0000 | Status: DC | PRN

## 2011-09-08 MED ORDER — EXENATIDE 10 MCG/0.04ML ~~LOC~~ SOPN: 10.0000 ug | PEN_INJECTOR | Freq: Two times a day (BID) | SUBCUTANEOUS | Status: DC

## 2011-09-08 NOTE — Progress Notes (Signed)
Patient ID: Kathryn Crosby, female   DOB: 05/06/1960, 51 y.o.   MRN: [id number] Reviewed and agree with Dr. Koval's documentation and management. 

## 2011-09-08 NOTE — Assessment & Plan Note (Signed)
  Diabetes of 5 yrs duration currently under poor control of blood glucose based on  previous. Lab Results  Component Value Date   HGBA1C 11.0 05/20/2011     , and A1c of 9.5 today. Home fasting CBGs range from 105 - 203 in the AM with an average of 159. Morning control is good, day time control is assumed to be suboptimal based on elevated A1c. Denies hypoglycemic events.  Able to verbalize appropriate hypoglycemia management plan. Started Byetta (exenatide) 5 mcg [place] bid x 1 month, then 10 mcg bid thereafter. Patient also instructed to monitor more frequent CBGs with some pre and post meal readings.  Written patient instructions provided.  Follow up in  Pharmacist Clinic Visit 09/12.   Total time in face to face counseling 40 minutes.  Patient seen with Bernadene Person, PharmD Candidate and Drue Stager, PharmD, Pharmacy Resident.

## 2011-09-08 NOTE — Patient Instructions (Addendum)
Start Byetta (exenatide); inject into your stomach twice daily for one month. Then increase to  twice daily after one month.  Continue to monitor you blood sugar in the morning, along with some before and after meals

## 2011-09-08 NOTE — Progress Notes (Signed)
  Subjective:    Patient ID: Kathryn Crosby, female    DOB: Dec 21, 1960, 51 y.o.   MRN: [id number]  HPI  Pt presented in good spirits for management of her DM. Patient was without complaint and has not experienced any hypoglycemia since previous visit. Lantus insulin was started on previous visit at 26 units daily and she has titrated this to 30 units daily. Patient did an excellent job of checking daily morning BG's since her last visit.    Review of Systems     Objective:   Physical Exam        Assessment & Plan:   Diabetes of 5 yrs duration currently under poor control of blood glucose based on  previous. Lab Results  Component Value Date   HGBA1C 11.0 05/20/2011     , and A1c of 9.5 today. Home fasting CBGs range from 105 - 203 in the AM with an average of 159. Morning control is good, day time control is assumed to be suboptimal based on elevated A1c. Denies hypoglycemic events.  Able to verbalize appropriate hypoglycemia management plan. Started Byetta (exenatide) 5 mcg [place] bid x 1 month, then 10 mcg bid thereafter. Patient also instructed to monitor more frequent CBGs with some pre and post meal readings.  Written patient instructions provided.  Follow up in  Pharmacist Clinic Visit 09/12.   Total time in face to face counseling 40 minutes.  Patient seen with Bernadene Person, PharmD Candidate and Drue Stager, PharmD, Pharmacy Resident. Marland Kitchen

## 2011-09-16 ENCOUNTER — Ambulatory Visit (HOSPITAL_COMMUNITY): Payer: Medicaid Other

## 2011-09-20 ENCOUNTER — Ambulatory Visit: Payer: Medicaid Other | Admitting: Family Medicine

## 2011-09-30 ENCOUNTER — Encounter: Payer: Self-pay | Admitting: Family Medicine

## 2011-09-30 ENCOUNTER — Telehealth: Payer: Self-pay | Admitting: Family Medicine

## 2011-09-30 ENCOUNTER — Ambulatory Visit (INDEPENDENT_AMBULATORY_CARE_PROVIDER_SITE_OTHER): Payer: Medicaid Other | Admitting: Family Medicine

## 2011-09-30 VITALS — BP 119/86 | HR 71 | Ht 68.0 in | Wt 274.0 lb

## 2011-09-30 DIAGNOSIS — N3941 Urge incontinence: Secondary | ICD-10-CM

## 2011-09-30 DIAGNOSIS — R358 Other polyuria: Secondary | ICD-10-CM

## 2011-09-30 DIAGNOSIS — E876 Hypokalemia: Secondary | ICD-10-CM

## 2011-09-30 DIAGNOSIS — R3589 Other polyuria: Secondary | ICD-10-CM

## 2011-09-30 DIAGNOSIS — R252 Cramp and spasm: Secondary | ICD-10-CM

## 2011-09-30 LAB — POCT URINALYSIS DIPSTICK
Blood, UA: NEGATIVE
Spec Grav, UA: 1.015
Urobilinogen, UA: 0.2

## 2011-09-30 LAB — BASIC METABOLIC PANEL
BUN: 17 mg/dL (ref 6–23)
Calcium: 9.2 mg/dL (ref 8.4–10.5)
Creat: 0.81 mg/dL (ref 0.50–1.10)
Glucose, Bld: 193 mg/dL — ABNORMAL HIGH (ref 70–99)

## 2011-09-30 LAB — POCT UA - MICROSCOPIC ONLY

## 2011-09-30 MED ORDER — CEPHALEXIN 500 MG PO CAPS: 500.0000 mg | ORAL_CAPSULE | Freq: Two times a day (BID) | ORAL | Status: AC

## 2011-09-30 MED ORDER — OXYBUTYNIN CHLORIDE 5 MG PO TABS: 5.0000 mg | ORAL_TABLET | Freq: Two times a day (BID) | ORAL | Status: DC

## 2011-09-30 NOTE — Patient Instructions (Addendum)
We are checking your potassium levels today. I think these are low because of how often you run to the bathroom. We are also checking for a urinary tract infection. For the weekend, increase your potassium to 2 pills a day.  On Tuesday, you can go back to 1 pill a day unless I call and tell you otherwise.    For your overactive bladder, the most helpful things are the lifestyle changes I've underlined below. The medicine for this is called Oxybutynin.  Take this twice daily as well as the changes below, and see Dr. Mikel Cella back in a month.    Overactive Bladder, Adult The bladder has two functions that are totally opposite of the other. One is to relax and stretch out so it can store urine (fills like a balloon), and the other is to contract and squeeze down so that it can empty the urine that it has stored. Proper functioning of the bladder is a complex mixing of these two functions. The filling and emptying of the bladder can be influenced by:  The bladder.   The spinal cord.   The brain.   The nerves going to the bladder.   Other organs that are closely related to the bladder such as prostate in males and the vagina in females.  As your bladder fills with urine, nerve signals are sent from the bladder to the brain to tell you that you may need to urinate. Normal urination requires that the bladder squeeze down with sufficient strength to empty the bladder, but this also requires that the bladder squeeze down sufficiently long to finish the job. In addition the sphincter muscles, which normally keep you from leaking urine, must also relax so that the urine can pass. Coordination between the bladder muscle squeezing down and the sphincter muscles relaxing is required to make everything happen normally. With an overactive bladder sometimes the muscles of the bladder contract unexpectedly and involuntarily and this causes an urgent need to urinate. The normal response is to try to hold urine in  by contracting the sphincter muscles. Sometimes the bladder contracts so strongly that the sphincter muscles cannot stop the urine from passing out and incontinence occurs. This kind of incontinence is called urge incontinence. Having an overactive bladder can be embarrassing and awkward. It can keep you from living life the way you want to. Many people think it is just something you have to put up with as you grow older or have certain health conditions. In fact, there are treatments that can help make your life easier and more pleasant. CAUSES  Many things can cause an overactive bladder. Possibilities include:  Urinary tract infection or infection of nearby tissues such as the prostate.   Prostate enlargement.   In women, multiple pregnancies or surgery on the uterus or urethra.   Bladder stones, inflammation or tumors.   Caffeine.   Alcohol.   Medications. For example, diuretics (drugs that help the body get rid of extra fluid) increase urine production. Some other medicines must be taken with lots of fluids.   Muscle or nerve weakness. This might be the result of a spinal cord injury, a stroke, multiple sclerosis or Parkinson's disease.   Diabetes can cause a high urine volume which fills the bladder so quickly that the normal urge to urinate is triggered very strongly.  SYMPTOMS   Loss of bladder control. You feel the need to urinate and cannot make your body wait.   Sudden, strong urges to  urinate.   Urinating 8 or more times a day.   Waking up to urinate two or more times a night.  DIAGNOSIS  To decide if you have overactive bladder, your healthcare provider will probably:  Ask about symptoms you have noticed.   Ask about your overall health. This will include questions about any medications you are taking.   Do a physical examination. This will help determine if there are obvious blockages or other problems.   Order some tests. These might include:   A blood test to  check for diabetes or other health issues that could be contributing to the problem.   Urine testing. This could measure the flow of urine and the pressure on the bladder.   A test of your neurological system (the brain, spinal cord and nerves). This is the system that senses the need to urinate. Some of these tests are called flow tests, bladder pressure tests and electrical measurements of the sphincter muscle.   A bladder test to check whether it is emptying completely when you urinate.   Cytoscopy. This test uses a thin tube with a tiny camera on it. It offers a look inside your urethra and bladder to see if there are problems.   Imaging tests. You might be given a contrast dye and then asked to urinate. X-rays are taken to see how your bladder is working.  TREATMENT  An overactive bladder can be treated in many ways. The treatment will depend on the cause. Whether you have a mild or severe case also makes a difference. Often, treatment can be given in your healthcare provider's office or clinic. Be sure to discuss the different options with your caregiver. They include:  Behavioral treatments. These do not involve medication or surgery:   Bladder training. For this, you would follow a schedule to urinate at regular intervals. This helps you learn to control the urge to urinate. At first, you might be asked to wait a few minutes after feeling the urge. In time, you should be able to schedule bathroom visits an hour or more apart.   Kegel exercises. These exercises strengthen the pelvic floor muscles, which support the bladder. By toning these muscles, they can help control urination, even if the bladder muscles are overactive. A specialist will teach you how to do these exercises correctly. They will require daily practice.   Weight loss. If you are obese or overweight, losing weight might stop your bladder from being overactive. Talk to your healthcare provider about how many pounds you  should lose. Also ask if there is a specific program or method that would work best for you.   Diet change. This might be suggested if constipation is making your overactive bladder worse. Your healthcare provider or a nutritionist can explain ways to change what you eat to ease constipation. Other people might need to take in less caffeine or alcohol. Sometimes drinking fewer fluids is needed, too.   Protection. This is not an actual treatment. But, you could wear special pads to take care of any leakage while you wait for other treatments to take effect. This will help you avoid embarrassment.   Physical treatments.   Electrical stimulation. Electrodes will send gentle pulses to the nerves or muscles that help control the bladder. The goal is to strengthen them. Sometimes this is done with the electrodes outside of the body. Or, they might be placed inside the body (implanted). This treatment can take several months to have an effect.  Medications. These are usually used along with other treatments. Several medicines are available. Some are injected into the muscles involved in urination. Others come in pill form. Medications sometimes prescribed include:   Anticholinergics. These drugs block the signals that the nerves deliver to the bladder. This keeps it from releasing urine at the wrong time. Researchers think the drugs might help in other ways, too.   Imipramine. This is an antidepressant. But, it relaxes bladder muscles.   Botox. This is still experimental. Some people believe that injecting it into the bladder muscles will relax them so they work more normally. It has also been injected into the sphincter muscle when the sphincter muscle does not open properly. This is a temporary fix, however. Also, it might make matters worse, especially in older people.   Surgery.   A device might be implanted to help manage your nerves. It works on the nerves that signal when you need to urinate.     Surgery is sometimes needed with electrical stimulation. If the electrodes are implanted, this is done through surgery.   Sometimes repairs need to be made through surgery. For example, the size of the bladder can be changed. This is usually done in severe cases only.  HOME CARE INSTRUCTIONS   Take any medications your healthcare provider prescribed or suggested. Follow the directions carefully.   Practice any lifestyle changes that are recommended. These might include:   Drinking less fluid or drinking at different times of the day. If you need to urinate often during the night, for example, you may need to stop drinking fluids early in the evening.   Cutting down on caffeine or alcohol. They can both make an overactive bladder worse. Caffeine is found in coffee, tea and sodas.   Doing Kegel exercises to strengthen muscles.   Losing weight, if that is recommended.   Eating a healthy and balanced diet. This will help you avoid constipation.   Keep a journal or a log. You might be asked to record how much you drink and when, and also when you feel the need to urinate.   Learn how to care for implants or other devices, such as pessaries.  SEEK MEDICAL CARE IF:   Your overactive bladder gets worse.   You feel increased pain or irritation when you urinate.   You notice blood in your urine.   You have questions about any medications or devices that your healthcare provider recommended.   You notice blood, pus or swelling at the site of any test or treatment procedure.   You have an oral temperature above 102 F (38.9 C).  SEEK IMMEDIATE MEDICAL CARE IF:  You have an oral temperature above 102 F (38.9 C), not controlled by medicine. Document Released: 11/13/2008 Document Revised: 01/06/2011 Document Reviewed: 11/13/2008 [facility] Patient Information 2012 [place], [place].

## 2011-09-30 NOTE — Telephone Encounter (Signed)
Appears patient has UTI especially in light of symptoms.  Plan to treat with keflex BID x 7 days.  Await urine culture as well. Called and discussed with patient

## 2011-09-30 NOTE — Progress Notes (Signed)
Patient ID: Kathryn Crosby, female   DOB: 01-12-1961, 51 y.o.   MRN: [id number] Kathryn Crosby is a 51 y.o. female who presents to [facility] today for leg cramps:  1.  Leg cramps:  Patient describes leg cramps which can occur any time of day.  Present for past month or so.  Has had some relieve with tablespoon of mustard.  Describes tightness and pulling in bilateral legs.  Wakes at night with cramps as well.  Mostly in calves but can also occur in hamstrings and quadriceps as well.  Takes potassium.  Does describe polydipsia and polyuria at baseline and has been so for months, but this is actually better than usual.  6-7 episodes of nocturia as well, also baseline for her for months.  No dysuria.    2.  Urge incontinence:  Patient describes several episodes of incontinence for the past several weeks. This can occur roughly once every day or so. She describes sudden urge to she is to get to the bathroom medication does not make it. She denies any stress incontinence symptoms such as incontinence with laughing coughing or sneezing. She states this has been gradually getting worse for the past several months as well. No fevers or chills. No back pain.  The following portions of the patient's history were reviewed and updated as appropriate: allergies, current medications, past medical history, family and social history, and problem list.  Patient is a nonsmoker.  Past Medical History  Diagnosis Date  . Diabetes mellitus   . Hypertension   . Hyperlipidemia   . Nephrolithiasis   . GERD (gastroesophageal reflux disease)     ROS as above otherwise neg. No Chest pain, palpitations, SOB, Fever, Chills, Abd pain, N/V/D.  Medications reviewed. Current Outpatient Prescriptions  Medication Sig Dispense Refill  . ACCU-CHEK FASTCLIX LANCETS MISC 1 strip by Does not apply route 2 (two) times daily. Quantity to dispense for at least two times a day.  1 each  12  . Alpha-Lipoic Acid 100 MG CAPS Take 1 capsule by  mouth daily.        Marland Kitchen amLODipine (NORVASC) 5 MG tablet Take 1 tablet (5 mg total) by mouth daily.  90 tablet  3  . Blood Glucose Monitoring Suppl (ACCU-CHEK NANO SMARTVIEW) W/DEVICE KIT 1 Device by Does not apply route once. Patient has coupon for free meter. Please wait for processing  1 kit  0  . exenatide (BYETTA 10 MCG PEN) 10 MCG/0.04ML SOLN Inject 0.04 mLs (10 mcg total) into the skin 2 (two) times daily with a meal.  2.4 mL  4  . exenatide (BYETTA 5 MCG PEN) 5 MCG/0.02ML SOLN Inject 0.02 mLs (5 mcg total) into the skin 2 (two) times daily with a meal.  1.2 mL  0  . glimepiride (AMARYL) 2 MG tablet Take 2 tablets (4 mg total) by mouth daily before breakfast.  60 tablet  5  . glucose blood (ACCU-CHEK SMARTVIEW) test strip Check twice daily  For diabetes 250.00  60 each  6  . hydrochlorothiazide (HYDRODIURIL) 25 MG tablet Take 25 mg by mouth daily.      Marland Kitchen ibuprofen (ADVIL,MOTRIN) 600 MG tablet Take 1 tablet (600 mg total) by mouth every 6 (six) hours as needed for pain.  60 tablet  5  . insulin glargine (LANTUS) 100 UNIT/ML injection Inject 30 Units into the skin daily.       . Insulin Pen Needle (NOVOFINE) 30G X 8 MM MISC Inject 10 each into the skin  as needed. QS for bid dosing of byetta  1 packet  11  . Insulin Syringe-Needle U-100 (INSULIN SYRINGE 1CC/31GX5/16") 31G X 5/16" 1 ML MISC 1 Syringe by Does not apply route daily. Quantity sufficient for once daily dosing  1 each  11  . losartan (COZAAR) 50 MG tablet Take 1 tablet (50 mg total) by mouth daily.  90 tablet  3  . metFORMIN (GLUCOPHAGE) 1000 MG tablet Take 1 tablet (1,000 mg total) by mouth 2 (two) times daily with a meal.  60 tablet  5  . metoprolol (TOPROL-XL) 200 MG 24 hr tablet Take 1 tablet (200 mg total) by mouth daily.  30 tablet  5  . omeprazole (PRILOSEC) 40 MG capsule Take 1 capsule (40 mg total) by mouth daily.  90 capsule  3  . potassium chloride SA (K-DUR,KLOR-CON) 20 MEQ tablet Take 20 mEq by mouth daily.       .  quinapril (ACCUPRIL) 40 MG tablet Take 1 tablet (40 mg total) by mouth at bedtime.  90 tablet  3  . rosuvastatin (CRESTOR) 10 MG tablet Take 1 tablet (10 mg total) by mouth daily.  90 tablet  5   Current Facility-Administered Medications  Medication Dose Route Frequency Provider Last Rate Last Dose  . triamcinolone acetonide (KENALOG) 10 MG/ML injection 10 mg  10 mg Intra-articular Once Hilarie Fredrickson, MD        Exam:  BP 119/86  Pulse 71  Ht 5\' 8"  (1.727 m)  Wt 274 lb (124.286 kg)  BMI 41.66 kg/m2 Gen: Well NAD HEENT: EOMI,  Moderately dry mucus membranes.   Lungs: CTABL Nl WOB Heart: RRR no MRG Abd: NABS, NT, ND Exts: Non edematous BL  LE, warm and well perfused.  Nontender to palpation.   Skin:  No rash noted BL LE's  No results found for this or any previous visit (from the past 72 hour(s)).

## 2011-09-30 NOTE — Assessment & Plan Note (Addendum)
Chronic problem for patient, also likely worsened by diabetes (A1C 9.5 early August).  Did discuss how control of diabetes will help with this as well.  She's been on oxybutynin the past when she has had issues with nephrolithiasis. She states this is helped. I did discuss behavioral changes and left-sided changes with her and stated that these are actually more helpful than any medication we have for overactive bladder. Patient expressed understanding. Provided her a written handout. Oxybutynin trial 5 mg twice a day. Followup with Dr. Mikel Cella in one month to see how she's doing. Also checking UA which will likely be negative, but don't want to miss UTI as cause for her polyuria.

## 2011-09-30 NOTE — Addendum Note (Signed)
Addended by: [place], Kalliope Riesen on: 09/30/2011 12:16 PM   Modules accepted: Orders

## 2011-09-30 NOTE — Addendum Note (Signed)
Addended byGwendolyn Grant, Brittni Hult H on: 09/30/2011 12:22 PM   Modules accepted: Orders

## 2011-09-30 NOTE — Assessment & Plan Note (Signed)
Believe this is secondary to chronic hypokalemia, which is likely secondary to chronic polyuria. Asked her to increase her potassium supplement to twice a day for the next 4 days. I will call her with the results is the K+ is very low. She go back to one potassium pill a day on Tuesday.

## 2011-10-06 ENCOUNTER — Ambulatory Visit (HOSPITAL_COMMUNITY)
Admission: RE | Admit: 2011-10-06 | Discharge: 2011-10-06 | Disposition: A | Discharge status: Home / Self Care | Payer: Medicaid Other | Source: Ambulatory Visit | Attending: Family Medicine | Admitting: Family Medicine

## 2011-10-06 DIAGNOSIS — Z1231 Encounter for screening mammogram for malignant neoplasm of breast: Secondary | ICD-10-CM | POA: Insufficient documentation

## 2011-10-13 ENCOUNTER — Ambulatory Visit: Payer: Medicaid Other | Admitting: Pharmacist

## 2011-10-26 ENCOUNTER — Ambulatory Visit (INDEPENDENT_AMBULATORY_CARE_PROVIDER_SITE_OTHER): Payer: Medicaid Other | Admitting: Family Medicine

## 2011-10-26 ENCOUNTER — Encounter: Payer: Self-pay | Admitting: Family Medicine

## 2011-10-26 VITALS — BP 122/82 | HR 71 | Temp 98.0°F | Ht 68.0 in | Wt 277.0 lb

## 2011-10-26 DIAGNOSIS — N200 Calculus of kidney: Secondary | ICD-10-CM

## 2011-10-26 DIAGNOSIS — M25569 Pain in unspecified knee: Secondary | ICD-10-CM

## 2011-10-26 DIAGNOSIS — B379 Candidiasis, unspecified: Secondary | ICD-10-CM

## 2011-10-26 DIAGNOSIS — E785 Hyperlipidemia, unspecified: Secondary | ICD-10-CM

## 2011-10-26 DIAGNOSIS — B49 Unspecified mycosis: Secondary | ICD-10-CM

## 2011-10-26 DIAGNOSIS — E119 Type 2 diabetes mellitus without complications: Secondary | ICD-10-CM

## 2011-10-26 DIAGNOSIS — I10 Essential (primary) hypertension: Secondary | ICD-10-CM

## 2011-10-26 MED ORDER — FLUCONAZOLE 150 MG PO TABS: 150.0000 mg | ORAL_TABLET | Freq: Every day | ORAL | Status: DC

## 2011-10-26 MED ORDER — ROSUVASTATIN CALCIUM 10 MG PO TABS: 10.0000 mg | ORAL_TABLET | Freq: Every day | ORAL | Status: DC

## 2011-10-26 MED ORDER — HYDROCHLOROTHIAZIDE 25 MG PO TABS: 25.0000 mg | ORAL_TABLET | Freq: Every day | ORAL | Status: DC

## 2011-10-26 MED ORDER — LOSARTAN POTASSIUM 50 MG PO TABS: 50.0000 mg | ORAL_TABLET | Freq: Every day | ORAL | Status: DC

## 2011-10-26 MED ORDER — GLIMEPIRIDE 2 MG PO TABS: 4.0000 mg | ORAL_TABLET | Freq: Every day | ORAL | Status: DC

## 2011-10-26 MED ORDER — METFORMIN HCL 1000 MG PO TABS: 1000.0000 mg | ORAL_TABLET | Freq: Two times a day (BID) | ORAL | Status: DC

## 2011-10-26 NOTE — Patient Instructions (Signed)
It was good to see you.  Please stop the Byetta. Make an appointment today to see Dr. Raymondo Band to discuss where to go from here. Continue Metformin and Amaryl for now.  I have sent in refills. Please take the Losartan instead of [name] (same medication, but cheaper) Also, call to make an eye appointment as well as a kidney appointment.  I will see you back in 4-6 weeks for a follow up or sooner if you need anything.  Take care! Mindy Behnken M. Rachele Lamaster, M.D.

## 2011-10-27 ENCOUNTER — Telehealth: Payer: Self-pay | Admitting: *Deleted

## 2011-10-27 DIAGNOSIS — B379 Candidiasis, unspecified: Secondary | ICD-10-CM | POA: Insufficient documentation

## 2011-10-27 NOTE — Assessment & Plan Note (Signed)
Encourage patient to call [facility] again to be evaluated. Continue IBU and ice for pain relief.

## 2011-10-27 NOTE — Progress Notes (Signed)
Patient ID: Kathryn Crosby, female   DOB: 07-30-1960, 51 y.o.   MRN: [id number] [name] Jenika Chiem M. Dylyn Mclaren, MD Phone: [phone]   Subjective: HPI: Patient is a 51 y.o. female presenting to clinic today for follow up. Concerns today include Byetta is making her feel poorly  1. DM- On Byetta, Lantus, Metformin and Amaryl. She states the byetta makes her feel poorly. She has acute onset SOB and CP with giving her injections for approx 20 mins. She checks her blood sugar, she does not have any lows and her highs are in the 200's. She would like to stop the Byetta. She does have some CP today, but she states it is always there and does not go away. Not worse with activity, does not radiate, does not change, nothing better, nothing worse. She is not in any distress. Patient has not been back to see Dr. Raymondo Band but would like to go back. Needs eye exam  2. Yeast infection- Patient with chronic yeast infections. Previously treated at [facility] with diflucan. I refilled this at a recent visit, but she was not able to get the medication when the health dept stopped giving her refills. She contines to have itching and discharge. No vaginal bleeding, no lesions. No odor. Would like referral to GYN, but she has not been adequately treated for this problem yet.  3. Kidney stones- Previously followed by [facility]. Needs to return, requesting referral but last seen in January of this year. Has recurrent UTI's, last one was a few weeks ago. No current dysuria, pain, blood in urine.   4. Knee pain- Patient with chronic knee pain. Requesting a referral as well. But previously seen at [facility] and would like to return there. Encourage her to call their office for evaluation. She does have bilateral pain. No redness or swelling. Take IBU which helps. Does not interfere with daily life  History Reviewed: Non smoker. Health Maintenance: Need flu shot  ROS: Please see HPI  above.  Objective: Office vital signs reviewed.  Physical Examination:  General: Awake, alert. NAD HEENT: Atraumatic, normocephalic Neck: No masses palpated. No LAD Pulm: CTAB, no wheezes Cardio: RRR Abdomen:Obese, +BS, soft, nontender, nondistended Extremities: No edema. TTP of medial knees. No redness Neuro: Grossly intact  Assessment: 51 yo F follow-up appointment  Plan: See Problem List and After Visit Summary

## 2011-10-27 NOTE — Telephone Encounter (Signed)
Patient notified

## 2011-10-27 NOTE — Assessment & Plan Note (Signed)
Patient is not tolerating Byetta well. Will ask her to d/c it for now, and make an appointment with Dr. Raymondo Band very soon for further adjustment of her medications. Continue Metformin, Amaryl and Lantus for now. If she has any lows, she should drink juice and call for help if it persists. Patient agrees with this plan.

## 2011-10-27 NOTE — Telephone Encounter (Signed)
Will switch to Simvastatin. Please let patient know to take Simvastatin instead of Crestor. Thank you!

## 2011-10-27 NOTE — Telephone Encounter (Signed)
PA required for Crestor. Form placed in MD box. 

## 2011-10-27 NOTE — Assessment & Plan Note (Signed)
No s/sx at this time. Requesting referral to urology, but she has been closely followed by urology at [facility]. Next appt is in December. Encouraged her to give them a call to get in sooner. She agrees

## 2011-10-27 NOTE — Assessment & Plan Note (Signed)
Recurrent yeast infection. Will treat with Diflucan daily. She takes it every other day, as she was previously taking. Would ask her to address this with urology as well. If she still requests a gyn referral, we will discuss this at her next visit.

## 2011-10-31 ENCOUNTER — Ambulatory Visit (INDEPENDENT_AMBULATORY_CARE_PROVIDER_SITE_OTHER): Payer: Medicaid Other | Admitting: Pharmacist

## 2011-10-31 ENCOUNTER — Encounter: Payer: Self-pay | Admitting: Pharmacist

## 2011-10-31 VITALS — BP 145/90 | HR 71 | Ht 67.75 in | Wt 283.2 lb

## 2011-10-31 DIAGNOSIS — E119 Type 2 diabetes mellitus without complications: Secondary | ICD-10-CM

## 2011-10-31 NOTE — Patient Instructions (Addendum)
It was good to see you again today!  Try taking Byetta 5 mcg twice daily before meals.  Never take it on any empty stomach.  Call us back in 1 week and let us know how you are tolerating the Byetta.    Try to check your blood sugars 2 hours after meals.  Remember, our goal is to get those numbers less than 200.

## 2011-10-31 NOTE — Progress Notes (Signed)
  Subjective:    Patient ID: Kathryn Crosby, female    DOB: [birth year], 51 y.o.   MRN: [id number]  HPI Patient arrives in good spirits for diabetes management follow-up.  She does report that her mother recently passed away and becomes tearful.  She reports adherence to Amaryl and metformin; however, she recently stopped taking her Byetta because it caused jitteriness, chest pain, and sweating. However, patient did not check CBGs at that time- unsure if this was hypoglycemia.  At the time she stopped taking her Byetta, she increased her dose of Lantus from 30 units to 40 units.    Review of Systems     Objective:   Physical Exam        Assessment & Plan:   Diabetes of many yrs duration currently under improved however A1c remains higher than goal.  Control of blood glucose is suboptimal based on  . Lab Results  Component Value Date   HGBA1C 9.5 09/08/2011     ,home fasting CBG readings of 178 (range of 132-260). Control is suboptimal due to life stress, dietary indiscretion, and suboptimal exercise plan. Denies hypoglycemic events.  Able to verbalize appropriate hypoglycemia management plan. Since recent increase to Lantus 40 units daily, readings have been consistently less than 200.  Continued basal insulin Lantus (insulin glargine), metformin, and Amaryl.  Retry Byetta at 5 mcg dose.  Patient believes she has 8 days of medication remaining.  Instructed patient to call back in 1 week and report symptoms with re-trial of Byetta.  If patient tolerates this, continue Byetta at this dose for one month prior to increasing to 10 mcg dose.   Written patient instructions provided.  Follow up with Dr. Mikel Cella Oct 30th and then follow up in  Pharmacist Clinic Visit in November.   Total time in face to face counseling 30 minutes.  Patient seen with Allene Dillon, PharmD Candidate. Marland Kitchen

## 2011-10-31 NOTE — Assessment & Plan Note (Signed)
Diabetes of many yrs duration currently under improved however A1c remains higher than goal.  Control of blood glucose is suboptimal based on  . Lab Results  Component Value Date   HGBA1C 9.5 09/08/2011     ,home fasting CBG readings of 178 (range of 132-260). Control is suboptimal due to life stress, dietary indiscretion, and suboptimal exercise plan. Denies hypoglycemic events.  Able to verbalize appropriate hypoglycemia management plan. Since recent increase to Lantus 40 units daily, readings have been consistently less than 200.  Continued basal insulin Lantus (insulin glargine), metformin, and Amaryl.  Retry Byetta at 5 mcg dose.  Patient believes she has 8 days of medication remaining.  Instructed patient to call back in 1 week and report symptoms with re-trial of Byetta.  If patient tolerates this, continue Byetta at this dose for one month prior to increasing to 10 mcg dose.   Written patient instructions provided.  Follow up with Dr. Mikel Cella Oct 30th and then follow up in  Pharmacist Clinic Visit in November.   Total time in face to face counseling 30 minutes.  Patient seen with Allene Dillon, PharmD Candidate.

## 2011-10-31 NOTE — Progress Notes (Signed)
Patient ID: Kathryn Crosby, female   DOB: 04-20-1960, 51 y.o.   MRN: [id number] Reviewed and agree with Dr. Macky Lower documentation and management.

## 2011-11-01 ENCOUNTER — Other Ambulatory Visit: Payer: Self-pay | Admitting: Family Medicine

## 2011-11-01 NOTE — Telephone Encounter (Signed)
Patient needs a refill on Byetta and the pharmacy said they needed auth from Dr. Raymondo Band.

## 2011-11-03 NOTE — Telephone Encounter (Signed)
Called Pharmacy and provided one time fill for Byetta pen dosed BID.

## 2011-11-09 DIAGNOSIS — R51 Headache: Secondary | ICD-10-CM | POA: Diagnosis not present

## 2011-11-09 DIAGNOSIS — E119 Type 2 diabetes mellitus without complications: Secondary | ICD-10-CM | POA: Diagnosis not present

## 2011-11-09 DIAGNOSIS — H04129 Dry eye syndrome of unspecified lacrimal gland: Secondary | ICD-10-CM | POA: Diagnosis not present

## 2011-11-30 ENCOUNTER — Other Ambulatory Visit: Payer: Self-pay | Admitting: Family Medicine

## 2011-12-01 ENCOUNTER — Ambulatory Visit: Payer: Medicaid Other | Admitting: Family Medicine

## 2011-12-12 ENCOUNTER — Encounter: Payer: Self-pay | Admitting: Home Health Services

## 2011-12-13 ENCOUNTER — Encounter: Payer: Self-pay | Admitting: Home Health Services

## 2011-12-19 ENCOUNTER — Encounter: Payer: Self-pay | Admitting: Family Medicine

## 2011-12-19 ENCOUNTER — Ambulatory Visit (INDEPENDENT_AMBULATORY_CARE_PROVIDER_SITE_OTHER): Payer: Medicare Other | Admitting: Family Medicine

## 2011-12-19 VITALS — BP 121/86 | HR 78 | Temp 98.2°F | Ht 67.75 in | Wt 275.0 lb

## 2011-12-19 DIAGNOSIS — I10 Essential (primary) hypertension: Secondary | ICD-10-CM | POA: Diagnosis not present

## 2011-12-19 DIAGNOSIS — E1149 Type 2 diabetes mellitus with other diabetic neurological complication: Secondary | ICD-10-CM | POA: Diagnosis not present

## 2011-12-19 NOTE — Patient Instructions (Signed)
I am glad everything is going well!  Keep taking your insulin and Byetta as you are taking. It sounds like your numbers are where they should be now, so hopefully if they stay less than 200, your A1C will start to trend down also.  Your blood pressure is perfect today! Keep taking your medication.  You can call any podiatry office to make an appointment, you should not need a referral from me but if they need anything, please have them contact my office.  Have a great Thanksgiving and come back to see me in 3 months for your check up!  Amber M. Hairford, M.D.

## 2011-12-19 NOTE — Progress Notes (Signed)
Patient ID: Kathryn Crosby, female   DOB: 1960/10/06, 51 y.o.   MRN: [id number] [name] Kathryn Stanek M. Jevin Camino, MD Phone: [phone]   Subjective: HPI: Patient is a 51 y.o. female presenting to clinic today for follow up. Concerns today include needs referral to podiatry for toenail trim and diabetic shoes.   1. Hypertension Blood pressure at home: Highest is 156/89. Patient reports she has started to exercise Blood pressure today: 121/86 Taking Meds: Yes Side effects: None. Reports no problem ROS: Denies headache, visual changes, nausea, vomiting, chest pain or abdominal pain or shortness of breath.  2. Diabetes:  High at home: 190 Low at home: 102 Taking medications: Yes Side effects: None, doing well with Byetta now.  ROS: denies fever, chills, dizziness, loss of conscieness, polyuria poly dipsia numbness or tingling in extremities or chest pain.  History Reviewed: Non-smoker. Health Maintenance: Declines flu shot  ROS: Please see HPI above.  Objective: Office vital signs reviewed.  Physical Examination:  General: Awake, alert. NAD. Very pleasant HEENT: Atraumatic, normocephalic Pulm: CTAB, no wheezes Cardio: RRR, no murmurs appreciated Abdomen: obses, soft, nontender, non distended  Extremities: No edema. Diabetic foot exam completed. No sores, mild neuropathy. Neuro: Grossly intact  Assessment: 51 yo F follow-up appointment  Plan: See Problem List and After Visit Summary

## 2011-12-19 NOTE — Assessment & Plan Note (Signed)
Continue current medications. CBP <200 recently. Will recheck A1c in 3 months, but hopefully since she is feeling better and getting more control the a1c will also improve. Foot exam completed today; patient would like podiatry referral. Does not need referral, should call foot doctor and make her own appoitnment. She agrees with this plan. Continue f/u with Dr. Raymondo Band as scheduled for diabetes teaching

## 2011-12-19 NOTE — Assessment & Plan Note (Addendum)
BP at goal! Patient feels well with no side effects. Continue all medications as prescribed. Continue to exercise. Will order future fasting labs for lipid panel and Bmet.

## 2011-12-20 ENCOUNTER — Emergency Department (HOSPITAL_BASED_OUTPATIENT_CLINIC_OR_DEPARTMENT_OTHER)
Admission: EM | Admit: 2011-12-20 | Discharge: 2011-12-21 | Disposition: A | Discharge status: Home / Self Care | Payer: Self-pay | Attending: Emergency Medicine | Admitting: Emergency Medicine

## 2011-12-20 ENCOUNTER — Encounter (HOSPITAL_BASED_OUTPATIENT_CLINIC_OR_DEPARTMENT_OTHER): Payer: Self-pay

## 2011-12-20 DIAGNOSIS — Z79899 Other long term (current) drug therapy: Secondary | ICD-10-CM | POA: Insufficient documentation

## 2011-12-20 DIAGNOSIS — Z794 Long term (current) use of insulin: Secondary | ICD-10-CM | POA: Insufficient documentation

## 2011-12-20 DIAGNOSIS — E785 Hyperlipidemia, unspecified: Secondary | ICD-10-CM | POA: Insufficient documentation

## 2011-12-20 DIAGNOSIS — G609 Hereditary and idiopathic neuropathy, unspecified: Secondary | ICD-10-CM | POA: Diagnosis not present

## 2011-12-20 DIAGNOSIS — I10 Essential (primary) hypertension: Secondary | ICD-10-CM | POA: Insufficient documentation

## 2011-12-20 DIAGNOSIS — M79609 Pain in unspecified limb: Secondary | ICD-10-CM | POA: Diagnosis not present

## 2011-12-20 DIAGNOSIS — E119 Type 2 diabetes mellitus without complications: Secondary | ICD-10-CM | POA: Insufficient documentation

## 2011-12-20 DIAGNOSIS — Z043 Encounter for examination and observation following other accident: Secondary | ICD-10-CM | POA: Insufficient documentation

## 2011-12-20 DIAGNOSIS — Z87441 Personal history of nephrotic syndrome: Secondary | ICD-10-CM | POA: Insufficient documentation

## 2011-12-20 DIAGNOSIS — IMO0002 Reserved for concepts with insufficient information to code with codable children: Secondary | ICD-10-CM | POA: Diagnosis not present

## 2011-12-20 DIAGNOSIS — Z8719 Personal history of other diseases of the digestive system: Secondary | ICD-10-CM | POA: Insufficient documentation
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
## 2011-12-28 DIAGNOSIS — N200 Calculus of kidney: Secondary | ICD-10-CM | POA: Diagnosis not present

## 2012-01-04 ENCOUNTER — Emergency Department (HOSPITAL_COMMUNITY)
Admission: EM | Admit: 2012-01-04 | Discharge: 2012-01-04 | Disposition: A | Discharge status: Home / Self Care | Payer: No Typology Code available for payment source | Attending: Emergency Medicine | Admitting: Emergency Medicine

## 2012-01-04 ENCOUNTER — Encounter (HOSPITAL_COMMUNITY): Payer: Self-pay | Admitting: *Deleted

## 2012-01-04 DIAGNOSIS — E119 Type 2 diabetes mellitus without complications: Secondary | ICD-10-CM | POA: Insufficient documentation

## 2012-01-04 DIAGNOSIS — S335XXA Sprain of ligaments of lumbar spine, initial encounter: Secondary | ICD-10-CM | POA: Diagnosis not present

## 2012-01-04 DIAGNOSIS — Y9389 Activity, other specified: Secondary | ICD-10-CM | POA: Insufficient documentation

## 2012-01-04 DIAGNOSIS — Z87442 Personal history of urinary calculi: Secondary | ICD-10-CM | POA: Insufficient documentation

## 2012-01-04 DIAGNOSIS — I10 Essential (primary) hypertension: Secondary | ICD-10-CM | POA: Insufficient documentation

## 2012-01-04 DIAGNOSIS — T148XXA Other injury of unspecified body region, initial encounter: Secondary | ICD-10-CM

## 2012-01-04 DIAGNOSIS — IMO0002 Reserved for concepts with insufficient information to code with codable children: Secondary | ICD-10-CM | POA: Insufficient documentation

## 2012-01-04 DIAGNOSIS — Y92410 Unspecified street and highway as the place of occurrence of the external cause: Secondary | ICD-10-CM | POA: Insufficient documentation

## 2012-01-04 DIAGNOSIS — G8911 Acute pain due to trauma: Secondary | ICD-10-CM | POA: Insufficient documentation

## 2012-01-04 DIAGNOSIS — M549 Dorsalgia, unspecified: Secondary | ICD-10-CM

## 2012-01-04 DIAGNOSIS — S239XXA Sprain of unspecified parts of thorax, initial encounter: Secondary | ICD-10-CM | POA: Diagnosis not present

## 2012-01-04 DIAGNOSIS — K219 Gastro-esophageal reflux disease without esophagitis: Secondary | ICD-10-CM | POA: Insufficient documentation

## 2012-01-04 DIAGNOSIS — E785 Hyperlipidemia, unspecified: Secondary | ICD-10-CM | POA: Insufficient documentation

## 2012-01-04 DIAGNOSIS — Z794 Long term (current) use of insulin: Secondary | ICD-10-CM | POA: Insufficient documentation

## 2012-01-04 DIAGNOSIS — Z79899 Other long term (current) drug therapy: Secondary | ICD-10-CM | POA: Insufficient documentation

## 2012-01-04 MED ORDER — METHOCARBAMOL 500 MG PO TABS: 500.0000 mg | ORAL_TABLET | Freq: Two times a day (BID) | ORAL | Status: DC
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
## 2012-01-05 NOTE — ED Provider Notes (Signed)
  Medical screening examination/treatment/procedure(s) were performed by non-physician practitioner and as supervising physician I was immediately available for consultation/collaboration.    Brailynn Breth D Dannika Hilgeman, MD 01/05/12 0152 

## 2012-01-10 ENCOUNTER — Other Ambulatory Visit: Payer: Self-pay | Admitting: Family Medicine

## 2012-01-10 DIAGNOSIS — IMO0002 Reserved for concepts with insufficient information to code with codable children: Secondary | ICD-10-CM | POA: Diagnosis not present

## 2012-01-10 DIAGNOSIS — R209 Unspecified disturbances of skin sensation: Secondary | ICD-10-CM | POA: Diagnosis not present

## 2012-01-10 DIAGNOSIS — M65979 Unspecified synovitis and tenosynovitis, unspecified ankle and foot: Secondary | ICD-10-CM | POA: Diagnosis not present

## 2012-01-10 DIAGNOSIS — M659 Synovitis and tenosynovitis, unspecified: Secondary | ICD-10-CM | POA: Diagnosis not present

## 2012-01-10 DIAGNOSIS — M7150 Other bursitis, not elsewhere classified, unspecified site: Secondary | ICD-10-CM | POA: Diagnosis not present

## 2012-01-30 ENCOUNTER — Other Ambulatory Visit: Payer: Self-pay | Admitting: Family Medicine

## 2012-02-05 ENCOUNTER — Other Ambulatory Visit: Payer: Self-pay | Admitting: Family Medicine

## 2012-02-08 ENCOUNTER — Other Ambulatory Visit: Payer: Self-pay | Admitting: Family Medicine

## 2012-02-22 ENCOUNTER — Emergency Department (HOSPITAL_COMMUNITY)
Admission: EM | Admit: 2012-02-22 | Discharge: 2012-02-22 | Disposition: A | Discharge status: Home / Self Care | Payer: Medicare Other | Attending: Emergency Medicine | Admitting: Emergency Medicine

## 2012-02-22 ENCOUNTER — Encounter (HOSPITAL_COMMUNITY): Payer: Self-pay | Admitting: Emergency Medicine

## 2012-02-22 DIAGNOSIS — Z87442 Personal history of urinary calculi: Secondary | ICD-10-CM | POA: Diagnosis not present

## 2012-02-22 DIAGNOSIS — E785 Hyperlipidemia, unspecified: Secondary | ICD-10-CM | POA: Insufficient documentation

## 2012-02-22 DIAGNOSIS — I10 Essential (primary) hypertension: Secondary | ICD-10-CM | POA: Insufficient documentation

## 2012-02-22 DIAGNOSIS — G43909 Migraine, unspecified, not intractable, without status migrainosus: Secondary | ICD-10-CM | POA: Diagnosis not present

## 2012-02-22 DIAGNOSIS — Z951 Presence of aortocoronary bypass graft: Secondary | ICD-10-CM | POA: Insufficient documentation

## 2012-02-22 DIAGNOSIS — E119 Type 2 diabetes mellitus without complications: Secondary | ICD-10-CM | POA: Insufficient documentation

## 2012-02-22 DIAGNOSIS — Z794 Long term (current) use of insulin: Secondary | ICD-10-CM | POA: Insufficient documentation

## 2012-02-22 DIAGNOSIS — N200 Calculus of kidney: Secondary | ICD-10-CM | POA: Insufficient documentation

## 2012-02-22 DIAGNOSIS — K219 Gastro-esophageal reflux disease without esophagitis: Secondary | ICD-10-CM | POA: Insufficient documentation

## 2012-02-22 HISTORY — DX: Other chronic pain: G89.29

## 2012-02-22 HISTORY — DX: Migraine, unspecified, not intractable, without status migrainosus: G43.909

## 2012-02-22 HISTORY — DX: Headache, unspecified: R51.9

## 2012-02-22 HISTORY — DX: Headache: R51

## 2012-02-22 HISTORY — DX: Calculus of kidney: N20.0

## 2012-02-22 LAB — COMPREHENSIVE METABOLIC PANEL
ALT: 12 U/L (ref 0–35)
BUN: 20 mg/dL (ref 6–23)
CO2: 25 mEq/L (ref 19–32)
Calcium: 8.9 mg/dL (ref 8.4–10.5)
Creatinine, Ser: 0.76 mg/dL (ref 0.50–1.10)
GFR calc Af Amer: >90 mL/min (ref >90)
GFR calc non Af Amer: >90 mL/min (ref >90)
Glucose, Bld: 200 mg/dL — ABNORMAL HIGH (ref 70–99)
Sodium: 141 mEq/L (ref 135–145)
Total Protein: 7.5 g/dL (ref 6.0–8.3)

## 2012-02-22 LAB — CBC WITH DIFFERENTIAL/PLATELET
Eosinophils Absolute: 0.3 10*3/uL (ref 0.0–0.7)
Eosinophils Relative: 2 % (ref 0–5)
HCT: 38.7 % (ref 36.0–46.0)
Hemoglobin: 13 g/dL (ref 12.0–15.0)
Lymphocytes Relative: 38 % (ref 12–46)
Lymphs Abs: 5 10*3/uL — ABNORMAL HIGH (ref 0.7–4.0)
MCH: 27 pg (ref 26.0–34.0)
MCV: 80.3 fL (ref 78.0–100.0)
Monocytes Absolute: 0.7 10*3/uL (ref 0.1–1.0)
Monocytes Relative: 5 % (ref 3–12)
Platelets: 358 10*3/uL (ref 150–400)
RBC: 4.82 MIL/uL (ref 3.87–5.11)
WBC: 13.1 10*3/uL — ABNORMAL HIGH (ref 4.0–10.5)

## 2012-02-22 MED ORDER — METOCLOPRAMIDE HCL 10 MG PO TABS: 10.0000 mg | ORAL_TABLET | Freq: Four times a day (QID) | ORAL | Status: DC | PRN

## 2012-02-22 MED ORDER — DIPHENHYDRAMINE HCL 50 MG/ML IJ SOLN
50.0000 mg | Freq: Once | INTRAMUSCULAR | Status: AC
  Administered 2012-02-22: 50 mg via INTRAMUSCULAR
  Filled 2012-02-22: qty 1

## 2012-02-22 MED ORDER — METOCLOPRAMIDE HCL 5 MG/ML IJ SOLN
10.0000 mg | Freq: Once | INTRAMUSCULAR | Status: AC
  Administered 2012-02-22: 10 mg via INTRAMUSCULAR
  Filled 2012-02-22: qty 2

## 2012-02-22 MED ORDER — ACETAMINOPHEN 500 MG PO TABS
1000.0000 mg | ORAL_TABLET | Freq: Once | ORAL | Status: AC
  Administered 2012-02-22: 1000 mg via ORAL
  Filled 2012-02-22: qty 2

## 2012-02-22 NOTE — ED Provider Notes (Signed)
History     CSN: [id number]  Arrival date & time 02/22/12  1947   First MD Initiated Contact with Patient 02/22/12 2055      Chief Complaint  Patient presents with  . Migraine     HPI Pt was seen at 2110.  Per pt, c/o gradual onset and persistence of constant acute flair of her chronic migraine headache for the past 1 week.  Describes the headache as per her usual chronic headache pain pattern for many years.  States her last headache was approx 1 year ago.  States she "used to take imitrex" but her PMD only rx her motrin at this time.  Denies headache was sudden or maximal in onset or at any time.  Denies visual changes, no focal motor weakness, no tingling/numbness in extremities, no fevers, no neck pain, no rash.      Past Medical History  Diagnosis Date  . Diabetes mellitus   . Hypertension   . Hyperlipidemia   . Nephrolithiasis   . GERD (gastroesophageal reflux disease)   . Migraine   . Kidney stones   . Chronic headaches     Past Surgical History  Procedure Date  . Abdominal hysterectomy   . Tonsillectomy   . Left ankle fracture   . Lithotripsy   . Sleep apnea     Family History  Problem Relation Age of Onset  . Coronary artery disease Brother     CABG age 21    History  Substance Use Topics  . Smoking status: Never Smoker   . Smokeless tobacco: Never Used  . Alcohol Use: No    Review of Systems ROS: Statement: All systems negative except as marked or noted in the HPI; Constitutional: Negative for fever and chills. ; ; Eyes: Negative for eye pain, redness and discharge. ; ; ENMT: Negative for ear pain, hoarseness, nasal congestion, sinus pressure and sore throat. ; ; Cardiovascular: Negative for chest pain, palpitations, diaphoresis, dyspnea and peripheral edema. ; ; Respiratory: Negative for cough, wheezing and stridor. ; ; Gastrointestinal: Negative for nausea, vomiting, diarrhea, abdominal pain, blood in stool, hematemesis, jaundice and rectal bleeding.  . ; ; Genitourinary: Negative for dysuria, flank pain and hematuria. ; ; Musculoskeletal: Negative for back pain and neck pain. Negative for swelling and trauma.; ; Skin: Negative for pruritus, rash, abrasions, blisters, bruising and skin lesion.; ; Neuro: +headache. Negative for lightheadedness and neck stiffness. Negative for weakness, altered level of consciousness , altered mental status, extremity weakness, paresthesias, involuntary movement, seizure and syncope.       Allergies  Aspirin; Ivp dye; and Shellfish-derived products  Home Medications   Current Outpatient Rx  Name  Route  Sig  Dispense  Refill  . ACCU-CHEK FASTCLIX LANCETS MISC   Does not apply   1 strip by Does not apply route 2 (two) times daily. Quantity to dispense for at least two times a day.   1 each   12   . ALPHA-LIPOIC ACID 100 MG PO CAPS   Oral   Take 1 capsule by mouth daily.           Marland Kitchen AMLODIPINE BESYLATE 5 MG PO TABS   Oral   Take 1 tablet (5 mg total) by mouth daily.   90 tablet   3   . ACCU-CHEK NANO SMARTVIEW W/DEVICE KIT   Does not apply   1 Device by Does not apply route once. Patient has coupon for free meter. Please wait for processing  1 kit   0   . BYETTA 5 MCG PEN 5 MCG/0.02ML [place] SOLN      INJECT 0.02 MILLILITERS (5 MCG) SUBCUTANEOUSLY TWICE A DAY WITH A MEAL.   1 mL   0   . CANDESARTAN CILEXETIL 32 MG PO TABS      TAKE ONE TABLET BY MOUTH EVERY DAY   30 tablet   10   . FLUCONAZOLE 150 MG PO TABS   Oral   Take 150 mg by mouth daily as needed. For yeast infections         . GLIMEPIRIDE 2 MG PO TABS      TAKE TWO TABLETS BY MOUTH EVERY DAY BEFORE  BREAKFAST   60 tablet   4   . GLUCOSE BLOOD VI STRP      Check twice daily  For diabetes 250.00   60 each   6   . HYDROCHLOROTHIAZIDE 25 MG PO TABS   Oral   Take 1 tablet (25 mg total) by mouth daily.   30 tablet   5   . IBUPROFEN 600 MG PO TABS   Oral   Take 1 tablet (600 mg total) by mouth every 6 (six) hours  as needed for pain.   60 tablet   5   . INSULIN GLARGINE 100 UNIT/ML [place] SOLN   Subcutaneous   Inject 40 Units into the skin daily.          . INSULIN PEN NEEDLE 30G X 8 MM MISC   Subcutaneous   Inject 10 each into the skin as needed. QS for bid dosing of byetta   1 packet   11   . INSULIN SYRINGE 31G X 5/16" 1 ML MISC   Does not apply   1 Syringe by Does not apply route daily. Quantity sufficient for once daily dosing   1 each   11   . LOSARTAN POTASSIUM 50 MG PO TABS   Oral   Take 1 tablet (50 mg total) by mouth daily.   90 tablet   3   . METFORMIN HCL 1000 MG PO TABS   Oral   Take 1 tablet (1,000 mg total) by mouth 2 (two) times daily with a meal.   60 tablet   5   . METHOCARBAMOL 500 MG PO TABS   Oral   Take 500 mg by mouth 2 (two) times daily as needed. For pain         . METOPROLOL SUCCINATE ER 200 MG PO TB24   Oral   Take 1 tablet (200 mg total) by mouth daily.   30 tablet   5   . OMEPRAZOLE 40 MG PO CPDR   Oral   Take 1 capsule (40 mg total) by mouth daily.   90 capsule   3   . OXYBUTYNIN CHLORIDE 5 MG PO TABS   Oral   Take 5 mg by mouth 2 (two) times daily as needed. For urinary pain         . POTASSIUM CHLORIDE CRYS ER 20 MEQ PO TBCR   Oral   Take 20 mEq by mouth daily.          . QUINAPRIL HCL 40 MG PO TABS   Oral   Take 1 tablet (40 mg total) by mouth at bedtime.   90 tablet   3   . METHOCARBAMOL 500 MG PO TABS   Oral   Take 500 mg by mouth 2 (two) times daily.  BP 154/88  Pulse 77  Temp 97.6 F (36.4 C) (Oral)  Resp 18  SpO2 98%  Physical Exam 2115: Physical examination:  Nursing notes reviewed; Vital signs and O2 SAT reviewed;  Constitutional: Well developed, Well nourished, Well hydrated, In no acute distress; Head:  Normocephalic, atraumatic; Eyes: EOMI, PERRL, No scleral icterus; ENMT: Mouth and pharynx normal, Mucous membranes moist; Neck: Supple, Full range of motion, No lymphadenopathy; Cardiovascular:  Regular rate and rhythm, No murmur, rub, or gallop; Respiratory: Breath sounds clear & equal bilaterally, No rales, rhonchi, wheezes.  Speaking full sentences with ease, Normal respiratory effort/excursion; Chest: Nontender, Movement normal; Abdomen: Soft, Nontender, Nondistended, Normal bowel sounds;; Extremities: Pulses normal, No tenderness, No edema, No calf edema or asymmetry.; Neuro: AA&Ox3, Major CN grossly intact.  No facial droop. Speech clear. Climbs on and off stretcher easily by herself. Gait steady. No gross focal motor or sensory deficits in extremities.; Skin: Color normal, Warm, Dry.   ED Course  Procedures    MDM  MDM Reviewed: nursing note, vitals and previous chart Interpretation: labs   Results for orders placed during the hospital encounter of 02/22/12  CBC WITH DIFFERENTIAL      Component Value Range   WBC 13.1 (*) 4.0 - 10.5 K/uL   RBC 4.82  3.87 - 5.11 MIL/uL   Hemoglobin 13.0  12.0 - 15.0 g/dL   HCT 21.3  08.6 - 57.8 %   MCV 80.3  78.0 - 100.0 fL   MCH 27.0  26.0 - 34.0 pg   MCHC 33.6  30.0 - 36.0 g/dL   RDW 46.9  62.9 - 52.8 %   Platelets 358  150 - 400 K/uL   Neutrophils Relative 54  43 - 77 %   Neutro Abs 7.0  1.7 - 7.7 K/uL   Lymphocytes Relative 38  12 - 46 %   Lymphs Abs 5.0 (*) 0.7 - 4.0 K/uL   Monocytes Relative 5  3 - 12 %   Monocytes Absolute 0.7  0.1 - 1.0 K/uL   Eosinophils Relative 2  0 - 5 %   Eosinophils Absolute 0.3  0.0 - 0.7 K/uL   Basophils Relative 0  0 - 1 %   Basophils Absolute 0.1  0.0 - 0.1 K/uL  COMPREHENSIVE METABOLIC PANEL      Component Value Range   Sodium 141  135 - 145 mEq/L   Potassium 3.6  3.5 - 5.1 mEq/L   Chloride 102  96 - 112 mEq/L   CO2 25  19 - 32 mEq/L   Glucose, Bld 200 (*) 70 - 99 mg/dL   BUN 20  6 - 23 mg/dL   Creatinine, Ser 4.13  0.50 - 1.10 mg/dL   Calcium 8.9  8.4 - 24.4 mg/dL   Total Protein 7.5  6.0 - 8.3 g/dL   Albumin 3.1 (*) 3.5 - 5.2 g/dL   AST 18  0 - 37 U/L   ALT 12  0 - 35 U/L   Alkaline  Phosphatase 94  39 - 117 U/L   Total Bilirubin 0.2 (*) 0.3 - 1.2 mg/dL   GFR calc non Af Amer >90  >90 mL/min   GFR calc Af Amer >90  >90 mL/min     2140:  Has hx of headaches, describes this headache as per her usual.  No red flags, will tx symptomatically at this time. Dx and testing d/w pt.  Questions answered.  Verb understanding, agreeable to d/c home with outpt f/u.  Laray Anger, DO 02/24/12 1554

## 2012-02-22 NOTE — ED Notes (Signed)
Patient reports intermittent migraine for the past week on the right side of her head; reports past history of migraines, but states that she has not gotten one in a long time. Patient reports that tylenol has provided little to no relief.  Reports nausea and light sensitivity; denies dizziness.

## 2012-03-03 ENCOUNTER — Other Ambulatory Visit: Payer: Self-pay | Admitting: Family Medicine

## 2012-03-05 ENCOUNTER — Telehealth: Payer: Self-pay | Admitting: *Deleted

## 2012-03-05 DIAGNOSIS — I10 Essential (primary) hypertension: Secondary | ICD-10-CM

## 2012-03-05 MED ORDER — LISINOPRIL 40 MG PO TABS: 40.0000 mg | ORAL_TABLET | Freq: Every day | ORAL | Status: DC

## 2012-03-05 MED ORDER — LOSARTAN POTASSIUM 50 MG PO TABS: 50.0000 mg | ORAL_TABLET | Freq: Every day | ORAL | Status: DC

## 2012-03-05 NOTE — Telephone Encounter (Signed)
Based on medication review, I have made the following changes:  Accupril 40 changed to Lisinopril 40mg . Refilled at [facility] for 90 days. [name] changed back to Losartan (Cozaar) which she has been on before. Refilled at [facility] for 90 days. Lantus was not changed. I have completed a PA for this since I prefer she stay on Lantus rather than Levemir.  Please let pt know that her medications have changed but will hopefully be more affordable.  Thanks! Margretta Zamorano M. Myquan Schaumburg, M.D.

## 2012-03-05 NOTE — Telephone Encounter (Signed)
PA required for Lantus, [name], and Accupril. Called and insurance will send forms. Also called patient to ask her to contact insurance company to find out  what meds are preferred and she will do this.

## 2012-03-05 NOTE — Telephone Encounter (Signed)
Forms placed in MD box. Also received list of meds on formulary. Written down and put in  MD box.

## 2012-03-05 NOTE — Telephone Encounter (Signed)
Pt called insurance company and they gave her a number for the nurse to call - [phone] Ask for formulary

## 2012-03-06 NOTE — Telephone Encounter (Signed)
Message left on voicemail regarding  message about meds. Advised to call back if she has any questions.

## 2012-03-06 NOTE — Telephone Encounter (Signed)
Pt called back and thanked Larita Fife for the message.  She was very appreciative!

## 2012-03-17 DIAGNOSIS — R0989 Other specified symptoms and signs involving the circulatory and respiratory systems: Secondary | ICD-10-CM | POA: Diagnosis not present

## 2012-03-17 DIAGNOSIS — R0609 Other forms of dyspnea: Secondary | ICD-10-CM | POA: Diagnosis not present

## 2012-03-17 DIAGNOSIS — G4733 Obstructive sleep apnea (adult) (pediatric): Secondary | ICD-10-CM | POA: Diagnosis not present

## 2012-03-29 ENCOUNTER — Other Ambulatory Visit: Payer: Self-pay | Admitting: Family Medicine

## 2012-03-30 ENCOUNTER — Ambulatory Visit: Payer: Medicare Other | Admitting: Family Medicine

## 2012-04-04 ENCOUNTER — Other Ambulatory Visit: Payer: Self-pay | Admitting: Family Medicine

## 2012-04-04 MED ORDER — AMLODIPINE BESYLATE 5 MG PO TABS: 5.0000 mg | ORAL_TABLET | Freq: Every day | ORAL | Status: DC

## 2012-04-11 ENCOUNTER — Emergency Department (HOSPITAL_COMMUNITY): Payer: Medicare Other

## 2012-04-11 ENCOUNTER — Emergency Department (HOSPITAL_COMMUNITY)
Admission: EM | Admit: 2012-04-11 | Discharge: 2012-04-11 | Disposition: A | Discharge status: Home / Self Care | Payer: Medicare Other | Attending: Emergency Medicine | Admitting: Emergency Medicine

## 2012-04-11 ENCOUNTER — Ambulatory Visit: Payer: Medicare Other | Admitting: Family Medicine

## 2012-04-11 ENCOUNTER — Encounter (HOSPITAL_COMMUNITY): Payer: Self-pay | Admitting: Emergency Medicine

## 2012-04-11 DIAGNOSIS — R0789 Other chest pain: Secondary | ICD-10-CM | POA: Diagnosis not present

## 2012-04-11 DIAGNOSIS — E119 Type 2 diabetes mellitus without complications: Secondary | ICD-10-CM | POA: Diagnosis not present

## 2012-04-11 DIAGNOSIS — R079 Chest pain, unspecified: Secondary | ICD-10-CM

## 2012-04-11 DIAGNOSIS — Z8679 Personal history of other diseases of the circulatory system: Secondary | ICD-10-CM | POA: Insufficient documentation

## 2012-04-11 DIAGNOSIS — E669 Obesity, unspecified: Secondary | ICD-10-CM | POA: Diagnosis not present

## 2012-04-11 DIAGNOSIS — Z79899 Other long term (current) drug therapy: Secondary | ICD-10-CM | POA: Diagnosis not present

## 2012-04-11 DIAGNOSIS — N390 Urinary tract infection, site not specified: Secondary | ICD-10-CM | POA: Diagnosis not present

## 2012-04-11 DIAGNOSIS — R197 Diarrhea, unspecified: Secondary | ICD-10-CM | POA: Insufficient documentation

## 2012-04-11 DIAGNOSIS — Z794 Long term (current) use of insulin: Secondary | ICD-10-CM | POA: Insufficient documentation

## 2012-04-11 DIAGNOSIS — Z9889 Other specified postprocedural states: Secondary | ICD-10-CM | POA: Diagnosis not present

## 2012-04-11 DIAGNOSIS — Z87442 Personal history of urinary calculi: Secondary | ICD-10-CM | POA: Insufficient documentation

## 2012-04-11 DIAGNOSIS — M79609 Pain in unspecified limb: Secondary | ICD-10-CM | POA: Insufficient documentation

## 2012-04-11 DIAGNOSIS — R072 Precordial pain: Secondary | ICD-10-CM | POA: Diagnosis not present

## 2012-04-11 DIAGNOSIS — K219 Gastro-esophageal reflux disease without esophagitis: Secondary | ICD-10-CM | POA: Insufficient documentation

## 2012-04-11 DIAGNOSIS — E7800 Pure hypercholesterolemia, unspecified: Secondary | ICD-10-CM | POA: Diagnosis not present

## 2012-04-11 DIAGNOSIS — Z8669 Personal history of other diseases of the nervous system and sense organs: Secondary | ICD-10-CM | POA: Diagnosis not present

## 2012-04-11 DIAGNOSIS — IMO0001 Reserved for inherently not codable concepts without codable children: Secondary | ICD-10-CM | POA: Diagnosis not present

## 2012-04-11 DIAGNOSIS — I10 Essential (primary) hypertension: Secondary | ICD-10-CM | POA: Diagnosis not present

## 2012-04-11 DIAGNOSIS — G8929 Other chronic pain: Secondary | ICD-10-CM | POA: Diagnosis not present

## 2012-04-11 LAB — URINALYSIS, ROUTINE W REFLEX MICROSCOPIC
Bilirubin Urine: NEGATIVE
Glucose, UA: NEGATIVE mg/dL
Ketones, ur: NEGATIVE mg/dL
Specific Gravity, Urine: 1.015 (ref 1.005–1.030)
pH: 7 (ref 5.0–8.0)

## 2012-04-11 LAB — CBC
MCH: 26.1 pg (ref 26.0–34.0)
MCHC: 33.2 g/dL (ref 30.0–36.0)
Platelets: 349 10*3/uL (ref 150–400)
RDW: 15.5 % (ref 11.5–15.5)

## 2012-04-11 LAB — URINE MICROSCOPIC-ADD ON

## 2012-04-11 LAB — POCT I-STAT TROPONIN I
Troponin i, poc: 0 ng/mL (ref 0.00–0.08)
Troponin i, poc: 0 ng/mL (ref 0.00–0.08)

## 2012-04-11 LAB — BASIC METABOLIC PANEL
Calcium: 8.9 mg/dL (ref 8.4–10.5)
GFR calc non Af Amer: 83 mL/min — ABNORMAL LOW (ref >90)
Sodium: 143 mEq/L (ref 135–145)

## 2012-04-11 MED ORDER — NITROGLYCERIN 0.4 MG SL SUBL: 0.4000 mg | SUBLINGUAL_TABLET | SUBLINGUAL | Status: DC | PRN

## 2012-04-11 MED ORDER — NITROFURANTOIN MACROCRYSTAL 100 MG PO CAPS: 100.0000 mg | ORAL_CAPSULE | Freq: Two times a day (BID) | ORAL | Status: DC

## 2012-04-11 MED ORDER — SODIUM CHLORIDE 0.9 % IV SOLN
1000.0000 mL | INTRAVENOUS | Status: DC
  Administered 2012-04-11: 1000 mL via INTRAVENOUS

## 2012-04-11 NOTE — ED Provider Notes (Signed)
History     CSN: [id number]  Arrival date & time 04/11/12  4098   First MD Initiated Contact with Patient 04/11/12 0750      Chief Complaint  Patient presents with  . Chest Pain    (Consider location/radiation/quality/duration/timing/severity/associated sxs/prior treatment) HPI Comments: Patient is a 52 year old woman who has diabetes, hypertension high cholesterol. She says that she's been having chest pain intermittently for about a week, gradually getting worse. She localizes her pain to the substernal region, with radiation of the right shoulder. She says it is a tightness and a sharp pain that comes and goes. She rates the pain at a 7 present. She also notes that she's had diarrhea for 2 days. She's also had some pain in her thighs.  Patient is a 52 y.o. female presenting with chest pain. The history is provided by the patient and medical records. No language interpreter was used.  Chest Pain Pain location:  Substernal area Pain quality: sharp and tightness   Pain radiates to:  R shoulder Pain radiates to the back: no   Pain severity:  Moderate Onset quality:  Gradual Duration: Intermittent episodes of substernal chest pain. Progression:  Worsening Chronicity:  New Relieved by:  None tried Worsened by:  Nothing tried Associated symptoms comment:  Diarrhea. Risk factors: diabetes mellitus, high cholesterol, hypertension and obesity     Past Medical History  Diagnosis Date  . Diabetes mellitus   . Hypertension   . Hyperlipidemia   . Nephrolithiasis   . GERD (gastroesophageal reflux disease)   . Migraine   . Kidney stones   . Chronic headaches     Past Surgical History  Procedure Laterality Date  . Abdominal hysterectomy    . Tonsillectomy    . Left ankle fracture    . Lithotripsy    . Sleep apnea      Family History  Problem Relation Age of Onset  . Coronary artery disease Brother     CABG age 76    History  Substance Use Topics  . Smoking status:  Never Smoker   . Smokeless tobacco: Never Used  . Alcohol Use: No    OB History   Grav Para Term Preterm Abortions TAB SAB Ect Mult Living                  Review of Systems  Constitutional: Negative.   HENT: Negative.   Eyes: Negative.   Respiratory: Negative.   Cardiovascular: Positive for chest pain.  Gastrointestinal: Positive for diarrhea.  Genitourinary: Negative.   Musculoskeletal: Positive for myalgias.  Skin: Negative.   Neurological: Negative.   Psychiatric/Behavioral: Negative.     Allergies  Aspirin; Ivp dye; and Shellfish-derived products  Home Medications   Current Outpatient Rx  Name  Route  Sig  Dispense  Refill  . ACCU-CHEK FASTCLIX LANCETS MISC   Does not apply   1 strip by Does not apply route 2 (two) times daily. Quantity to dispense for at least two times a day.   1 each   12   . ACCU-CHEK SMARTVIEW test strip      USE ONE STRIP TO CHECK GLUCOSE TWICE DAILY   50 each   5   . Alpha-Lipoic Acid 100 MG CAPS   Oral   Take 1 capsule by mouth daily.           Marland Kitchen amLODipine (NORVASC) 5 MG tablet   Oral   Take 1 tablet (5 mg total) by mouth  daily.   90 tablet   3   . Blood Glucose Monitoring Suppl (ACCU-CHEK NANO SMARTVIEW) W/DEVICE KIT   Does not apply   1 Device by Does not apply route once. Patient has coupon for free meter. Please wait for processing   1 kit   0   . BYETTA 5 MCG PEN 5 MCG/0.02ML SOLN      INJECT 0.02 ML( 5 MCG)  SUBCUTANEOUSLY TWICE DAILY WITH A MEAL   3 pen   2   . fluconazole (DIFLUCAN) 150 MG tablet   Oral   Take 150 mg by mouth daily as needed. For yeast infections         . glimepiride (AMARYL) 2 MG tablet      TAKE TWO TABLETS BY MOUTH EVERY DAY BEFORE  BREAKFAST   60 tablet   4   . hydrochlorothiazide (HYDRODIURIL) 25 MG tablet   Oral   Take 1 tablet (25 mg total) by mouth daily.   30 tablet   5   . ibuprofen (ADVIL,MOTRIN) 600 MG tablet   Oral   Take 1 tablet (600 mg total) by mouth every  6 (six) hours as needed for pain.   60 tablet   5   . insulin glargine (LANTUS) 100 UNIT/ML injection   Subcutaneous   Inject 40 Units into the skin daily.          . Insulin Pen Needle (NOVOFINE) 30G X 8 MM MISC   Subcutaneous   Inject 10 each into the skin as needed. QS for bid dosing of byetta   1 packet   11   . Insulin Syringe-Needle U-100 (INSULIN SYRINGE 1CC/31GX5/16") 31G X 5/16" 1 ML MISC   Does not apply   1 Syringe by Does not apply route daily. Quantity sufficient for once daily dosing   1 each   11   . lisinopril (PRINIVIL,ZESTRIL) 40 MG tablet   Oral   Take 1 tablet (40 mg total) by mouth daily.   90 tablet   3   . losartan (COZAAR) 50 MG tablet   Oral   Take 1 tablet (50 mg total) by mouth daily.   90 tablet   3   . metFORMIN (GLUCOPHAGE) 1000 MG tablet   Oral   Take 1 tablet (1,000 mg total) by mouth 2 (two) times daily with a meal.   60 tablet   5   . methocarbamol (ROBAXIN) 500 MG tablet   Oral   Take 500 mg by mouth 2 (two) times daily as needed. For pain         . metoCLOPramide (REGLAN) 10 MG tablet   Oral   Take 1 tablet (10 mg total) by mouth every 6 (six) hours as needed (for headache or nausea).   6 tablet   0   . metoprolol (TOPROL-XL) 200 MG 24 hr tablet   Oral   Take 1 tablet (200 mg total) by mouth daily.   30 tablet   5   . omeprazole (PRILOSEC) 40 MG capsule   Oral   Take 1 capsule (40 mg total) by mouth daily.   90 capsule   3   . oxybutynin (DITROPAN) 5 MG tablet   Oral   Take 5 mg by mouth 2 (two) times daily as needed. For urinary pain         . potassium chloride SA (K-DUR,KLOR-CON) 20 MEQ tablet   Oral   Take 20 mEq by mouth daily.  BP 148/95  Pulse 80  Temp(Src) 98 F (36.7 C) (Oral)  Resp 20  SpO2 99%  Physical Exam  Nursing note and vitals reviewed. Constitutional: She is oriented to person, place, and time.  Obese middle-aged woman in no apparent distress.  HENT:  Head:  Normocephalic and atraumatic.  Right Ear: External ear normal.  Left Ear: External ear normal.  Mouth/Throat: Oropharynx is clear and moist.  Eyes: Conjunctivae and EOM are normal. Pupils are equal, round, and reactive to light. No scleral icterus.  Neck: Normal range of motion. Neck supple.  Cardiovascular: Normal rate, regular rhythm and normal heart sounds.   Pulmonary/Chest: Effort normal and breath sounds normal.  Abdominal: Soft. Bowel sounds are normal.  Musculoskeletal: Normal range of motion.  1+ pedal edema bilaterally.  Neurological: She is alert and oriented to person, place, and time.  No sensory or motor deficit.  Skin: Skin is warm and dry.  Psychiatric: She has a normal mood and affect. Her behavior is normal.    ED Course  Procedures (including critical care time)  Labs Reviewed  CBC  BASIC METABOLIC PANEL    Date: 04/11/2012  Rate:79  Rhythm: normal sinus rhythm  QRS Axis: normal  Intervals: normal  ST/T Wave abnormalities: Inverted T waves in inferior and lateral precordial leads.  Conduction Disutrbances:none  Narrative Interpretation: Borderline EKG  Old EKG Reviewed: unchanged  8:17 AM Pt was seen and had physical examination.  Aspirin was not given as pt states allergy to aspirin.  Lab workup ordered.  Sublingual NTG ordered.  11:28 AM Results for orders placed during the hospital encounter of 04/11/12  CBC      Result Value Range   WBC 10.3  4.0 - 10.5 K/uL   RBC 4.68  3.87 - 5.11 MIL/uL   Hemoglobin 12.2  12.0 - 15.0 g/dL   HCT 81.1  91.4 - 78.2 %   MCV 78.4  78.0 - 100.0 fL   MCH 26.1  26.0 - 34.0 pg   MCHC 33.2  30.0 - 36.0 g/dL   RDW 95.6  21.3 - 08.6 %   Platelets 349  150 - 400 K/uL  BASIC METABOLIC PANEL      Result Value Range   Sodium 143  135 - 145 mEq/L   Potassium 3.1 (*) 3.5 - 5.1 mEq/L   Chloride 103  96 - 112 mEq/L   CO2 31  19 - 32 mEq/L   Glucose, Bld 175 (*) 70 - 99 mg/dL   BUN 13  6 - 23 mg/dL   Creatinine, Ser 5.78   0.50 - 1.10 mg/dL   Calcium 8.9  8.4 - 46.9 mg/dL   GFR calc non Af Amer 83 (*) >90 mL/min   GFR calc Af Amer >90  >90 mL/min  PROTIME-INR      Result Value Range   Prothrombin Time 13.5  11.6 - 15.2 seconds   INR 1.04  0.00 - 1.49  APTT      Result Value Range   aPTT 27  24 - 37 seconds  URINALYSIS, ROUTINE W REFLEX MICROSCOPIC      Result Value Range   Color, Urine YELLOW  YELLOW   APPearance HAZY (*) CLEAR   Specific Gravity, Urine 1.015  1.005 - 1.030   pH 7.0  5.0 - 8.0   Glucose, UA NEGATIVE  NEGATIVE mg/dL   Hgb urine dipstick NEGATIVE  NEGATIVE   Bilirubin Urine NEGATIVE  NEGATIVE   Ketones, ur NEGATIVE  NEGATIVE  mg/dL   Protein, ur NEGATIVE  NEGATIVE mg/dL   Urobilinogen, UA 0.2  0.0 - 1.0 mg/dL   Nitrite NEGATIVE  NEGATIVE   Leukocytes, UA MODERATE (*) NEGATIVE  URINE MICROSCOPIC-ADD ON      Result Value Range   Squamous Epithelial / LPF RARE  RARE   WBC, UA 21-50  <3 WBC/hpf   RBC / HPF 0-2  <3 RBC/hpf   Bacteria, UA FEW (*) RARE  POCT I-STAT TROPONIN I      Result Value Range   Troponin i, poc 0.00  0.00 - 0.08 ng/mL   Comment 3            Dg Chest Portable 1 View  04/11/2012  *RADIOLOGY REPORT*  Clinical Data: Chest pain  PORTABLE CHEST - 1 VIEW  Comparison: Chest radiograph 11/30/2010  Findings: Normal mediastinum and cardiac silhouette.  Normal pulmonary  vasculature.  No evidence of effusion, infiltrate, or pneumothorax.  No acute bony abnormality.  IMPRESSION: Normal chest radiograph.   Original Report Authenticated By: Genevive Bi, M.D.     Lab tests showed evidence of UTI.  Initial TNI is negative.  If 3 hour TNI is negative, will release, Rx for UTI.  12:36 PM Second TNI is negative.  Will Rx for UTI.        1. Nonspecific chest pain   2. Urinary tract infection            Carleene Cooper III, MD 04/11/12 1244

## 2012-04-12 LAB — URINE CULTURE

## 2012-04-17 ENCOUNTER — Encounter: Payer: Self-pay | Admitting: Home Health Services

## 2012-04-17 DIAGNOSIS — G4733 Obstructive sleep apnea (adult) (pediatric): Secondary | ICD-10-CM | POA: Diagnosis not present

## 2012-04-24 ENCOUNTER — Other Ambulatory Visit: Payer: Self-pay | Admitting: Family Medicine

## 2012-04-26 ENCOUNTER — Encounter: Payer: Self-pay | Admitting: Family Medicine

## 2012-04-26 ENCOUNTER — Ambulatory Visit (INDEPENDENT_AMBULATORY_CARE_PROVIDER_SITE_OTHER): Payer: Medicare Other | Admitting: Family Medicine

## 2012-04-26 VITALS — BP 131/85 | HR 70 | Temp 98.2°F | Ht 68.0 in | Wt 282.0 lb

## 2012-04-26 DIAGNOSIS — IMO0001 Reserved for inherently not codable concepts without codable children: Secondary | ICD-10-CM

## 2012-04-26 DIAGNOSIS — B379 Candidiasis, unspecified: Secondary | ICD-10-CM

## 2012-04-26 DIAGNOSIS — I10 Essential (primary) hypertension: Secondary | ICD-10-CM

## 2012-04-26 DIAGNOSIS — B49 Unspecified mycosis: Secondary | ICD-10-CM | POA: Diagnosis not present

## 2012-04-26 DIAGNOSIS — E1165 Type 2 diabetes mellitus with hyperglycemia: Secondary | ICD-10-CM

## 2012-04-26 DIAGNOSIS — IMO0002 Reserved for concepts with insufficient information to code with codable children: Secondary | ICD-10-CM

## 2012-04-26 LAB — POCT GLYCOSYLATED HEMOGLOBIN (HGB A1C): Hemoglobin A1C: 9

## 2012-04-26 MED ORDER — FLUCONAZOLE 150 MG PO TABS: 150.0000 mg | ORAL_TABLET | Freq: Every day | ORAL | Status: DC | PRN

## 2012-04-26 NOTE — Progress Notes (Signed)
Patient ID: Kathryn Crosby, female   DOB: [birth year], 52 y.o.   MRN: [id number]  [facility] Kathryn Crosby M. [name], MD Phone: [phone]   Subjective: HPI: Patient is a 52 y.o. female presenting to clinic today for follow up appointment. Concerns today include recurrent UTI  1. Hypertension - Seen in ED for chest pain a few weeks ago. This has now resolved, and workup was negative. Blood pressure at home: highest was 168/100 ( over a month ago and was stressed out over her daughter ) Blood pressure today: 131/85 Taking Meds: Yes; we changed her medications to insurance formulary to Lisinopril and Losartan Side effects: None ROS: Denies headache, visual changes, nausea, vomiting, no current chest pain, abdominal pain or shortness of breath.  2. Diabetes: Lantus, Byetta going well. High at home: Highest is 200 Low at home: 67 Taking medications: Yes Side effects: No symptomatic lows ROS: denies fever, chills, dizziness, LOC, polyuria, polydipsia, numbness or tingling in extremities or chest pain.  3. UTI: Treated in ED for UTI with Nitrofurantoin x6 days. Now with some vaginal itching. Prone to get yeast infections. Has been on Diflucan as needed for years. She is requesting a refill. Her next urology appointment is in November.   History Reviewed: Non-smoker. Health Maintenance: Foot exam UTD and last eye exam 6 months ago  ROS: Please see HPI above.  Objective: Office vital signs reviewed. There were no vitals taken for this visit.  Physical Examination:  General: Awake, alert. NAD. Pleasant HEENT: Atraumatic, normocephalic. EOM wnl Pulm: CTAB, no wheezes. Good effort Cardio: RRR, no murmurs appreciated Abdomen: obese, +BS, soft, nontender, nondistended Extremities: No edema. Neuro: Grossly intact  Assessment: 52 y.o. female follow up appointment  Plan: See Problem List and After Visit Summary

## 2012-04-26 NOTE — Assessment & Plan Note (Signed)
BP at goal today. She states she is taking Lisinopril and Accupril due to confusion. Will d/c accuprill and take lisinopril only since it is her insurance preferred medication. BMet reviewed from 04/11/12 and wnl except low K+ which is being replaced orally.

## 2012-04-26 NOTE — Assessment & Plan Note (Signed)
Doing well with insulin without symptomatic lows. Her A1c was 9.0 today, which is down from prior reading but still high. She met with Arlys John (health coach) today and I have encouraged her to meet with Dr. Raymondo Band soon as well to discuss optimization of current plan since she did so well with his instruction in the past. Patient is discouraged by her A1C today, but hopefully we can continue to work together to get it down. RTC in 3 months for follow up with me, or sooner if needed.

## 2012-04-26 NOTE — Assessment & Plan Note (Signed)
Multiple UTI within the last year, followed by Urology. Next appt in November, encouraged her to keep this appt and discuss this with them. For now, refill Diflucan and RTC if it worsens.

## 2012-04-26 NOTE — Patient Instructions (Addendum)
It was good to see you today.  I have sent in a refill for your diflucan. If you need other refills let me know. Your blood pressure looked PERFECT today! Stop taking the Accupril at night and only the Lisinopril.  Your A1c was 9.0 today. I would like for you to see Arlys John today, and schedule an appointment with Dr. Raymondo Band when you can.  I will see you back in 2-3 months. Let me know if you need anything!  Juniper Cobey M. Ashwini Jago, M.D.

## 2012-05-02 ENCOUNTER — Telehealth: Payer: Self-pay | Admitting: Home Health Services

## 2012-05-02 NOTE — Telephone Encounter (Signed)
Spoke with Jasmine December.  Pt reports taking all medications as prescribed.  Pt reports eating vegetables every day (salad) and focusing on lean meats.  Pt reports walking 2x this past week.   Pt seems upbeat and highly motivated to continue with behavior changes.  Pt's goal this next week to is to continue to focus diet on vegetables, lean meats and to continue walking 2-3 times.  Pt's overall goal is dm management.

## 2012-05-03 ENCOUNTER — Ambulatory Visit (INDEPENDENT_AMBULATORY_CARE_PROVIDER_SITE_OTHER): Payer: Medicare Other | Admitting: Pharmacist

## 2012-05-03 ENCOUNTER — Encounter: Payer: Self-pay | Admitting: Pharmacist

## 2012-05-03 ENCOUNTER — Other Ambulatory Visit: Payer: Self-pay | Admitting: *Deleted

## 2012-05-03 VITALS — BP 149/99 | HR 67 | Ht 67.5 in | Wt 284.0 lb

## 2012-05-03 DIAGNOSIS — E1165 Type 2 diabetes mellitus with hyperglycemia: Secondary | ICD-10-CM

## 2012-05-03 DIAGNOSIS — E119 Type 2 diabetes mellitus without complications: Secondary | ICD-10-CM

## 2012-05-03 DIAGNOSIS — IMO0001 Reserved for inherently not codable concepts without codable children: Secondary | ICD-10-CM

## 2012-05-03 DIAGNOSIS — IMO0002 Reserved for concepts with insufficient information to code with codable children: Secondary | ICD-10-CM

## 2012-05-03 MED ORDER — ACCU-CHEK FASTCLIX LANCETS MISC: 1.0000 | Freq: Two times a day (BID) | Status: DC

## 2012-05-03 MED ORDER — LIRAGLUTIDE 18 MG/3ML ~~LOC~~ SOLN: 1.2000 mg | Freq: Every day | SUBCUTANEOUS | Status: DC

## 2012-05-03 NOTE — Progress Notes (Signed)
  Subjective:    Patient ID: Kathryn Crosby, female    DOB: Jul 30, 1960, 52 y.o.   MRN: [id number]  HPI Patient arrives in good spirits.    She reports that her A1C was still 9.0 at her last visit.   She realizes that she needs to decrease her portion size and increase exercise.    Review of Systems     Objective:   Physical Exam  From meter download: AM fastings routinely <200.  30-day average CBG = 167.      Assessment & Plan:   Diabetes of multiple yrs duration currently under fair control of blood glucose based on  . Lab Results  Component Value Date   HGBA1C 9.0 04/26/2012     ,home fasting CBG readings of <200. Control is suboptimal due to dietary indiscretion and inadequate exercise plan. Denies hypoglycemic events.  Able to verbalize appropriate hypoglycemia management plan. Continued basal insulin Lantus (insulin glargine). Patient to continue Byetta until supply is out, which will occur in approximately 2 weeks. Then the patient will switch to Victoza.  Written patient instructions provided.  Follow up in  Pharmacist Clinic Visit early-mid May.   Total time in face to face counseling 20 minutes.  Patient seen with Juanita Craver, PharmD candidate.

## 2012-05-03 NOTE — Assessment & Plan Note (Signed)
Diabetes of multiple yrs duration currently under fair control of blood glucose based on  . Lab Results  Component Value Date   HGBA1C 9.0 04/26/2012     ,home fasting CBG readings of <200. Control is suboptimal due to dietary indiscretion and inadequate exercise plan. Denies hypoglycemic events.  Able to verbalize appropriate hypoglycemia management plan. Continued basal insulin Lantus (insulin glargine). Patient to continue Byetta until supply is out, which will occur in approximately 2 weeks. Then the patient will switch to Victoza.  Written patient instructions provided.  Follow up in  Pharmacist Clinic Visit early-mid May.   Total time in face to face counseling 20 minutes.  Patient seen with Juanita Craver, PharmD candidate.

## 2012-05-03 NOTE — Patient Instructions (Addendum)
It was good to see you today!  Finish up your Byetta over the next two weeks and make sure that the Victoza is covered by your insurance.  When you start the Victoza, start with a 0.6 mg injection every day. After one week, increase to 1.2 mg Victoza.   While on this medication, stop eating once you are full. If you keep eating once you feel full, you are more likely to feel nauseated.   Call next week for an appointment in mid-May

## 2012-05-04 NOTE — Progress Notes (Signed)
Patient ID: Kathryn Crosby, female   DOB: Oct 20, 1960, 51 y.o.   MRN: [id number] Reviewed: Agree with Dr. Macky Lower documentation and management.

## 2012-05-09 ENCOUNTER — Telehealth: Payer: Self-pay | Admitting: Home Health Services

## 2012-05-09 NOTE — Telephone Encounter (Signed)
Spoke with Kathryn Crosby Pt reports feeling: good Pt reports taking medications yes Patient missed taking medications 0 days this week.   Pt is self monitoring their DM:yes medication compliance: compliant all of the time diabetic diet compliance: compliant most of the time  Last weeks goals:Walk 2-3 times, focus on diabetic diet- pt has success with these goals This weeks goals: Walk 2-3 times, focus on diabetic diet Pt's overall goal is Dm management, weight loss

## 2012-05-21 ENCOUNTER — Encounter: Payer: Self-pay | Admitting: *Deleted

## 2012-05-22 ENCOUNTER — Telehealth: Payer: Self-pay | Admitting: *Deleted

## 2012-05-22 NOTE — Telephone Encounter (Signed)
Yes, please stop the Accupril. She should only be on Lisinopril. If you call [facility], could you please have them remove accupril from her drug list?  Thank you! Celisse Ciulla M. Kaizen Ibsen, M.D.

## 2012-05-22 NOTE — Telephone Encounter (Signed)
[facility] pharmacy informed that Accupril has been d/c'd and will update patient's med profile.  Gaylene Brooks, RN

## 2012-05-22 NOTE — Telephone Encounter (Signed)
Received fax from [facility] for prior authorization---Accupril 40 mg #90.  Last office visit (04/26/12) states patient is taking lisinopril and that Accupril was d/c'd.  Will clarify with Dr. Mikel Cella and call pharmacy.  Gaylene Brooks, RN

## 2012-05-24 ENCOUNTER — Telehealth: Payer: Self-pay | Admitting: Home Health Services

## 2012-05-24 NOTE — Telephone Encounter (Signed)
Spoke with Kathryn Crosby Pt reports feeling: good Pt reports taking medications yes Patient missed taking medications 0 days this week.   Pt is self monitoring their DM:yes medication compliance: compliant all of the time, diabetic diet compliance: compliant most of the time Reported fasting BG values: 114,119, 109  Pt is self monitoring their HTN:not applicable   Last weeks goals:walk 2-3 times, control carbs/sugars in diet Pt was successful with last week's goals: yes This weeks goals: Walk 2-3 days (1 hour) and control carbs/sugars.  Pt's overall goal is: dm management

## 2012-05-29 DIAGNOSIS — G4733 Obstructive sleep apnea (adult) (pediatric): Secondary | ICD-10-CM | POA: Diagnosis not present

## 2012-06-03 ENCOUNTER — Other Ambulatory Visit: Payer: Self-pay | Admitting: Family Medicine

## 2012-06-05 ENCOUNTER — Other Ambulatory Visit: Payer: Self-pay | Admitting: Family Medicine

## 2012-07-05 ENCOUNTER — Telehealth: Payer: Self-pay | Admitting: Home Health Services

## 2012-07-05 NOTE — Telephone Encounter (Signed)
Spoke with Kathryn Crosby Pt reports feeling: good Pt reports taking medications yes Patient missed taking medications 0 days this week.   Pt is self monitoring their DM:yes medication compliance: compliant all of the time, diabetic diet compliance: compliant all of the time, home glucose monitoring: is performed regularly, fasting values range 85-100  Last weeks goals:diabetic diet, daily walks Pt was successful with last week's goals: yes This weeks goals: diabetic diet daily walks Pt's overall goal is: dm management, weight loss

## 2012-07-09 ENCOUNTER — Other Ambulatory Visit: Payer: Self-pay | Admitting: Family Medicine

## 2012-07-10 ENCOUNTER — Other Ambulatory Visit: Payer: Self-pay | Admitting: *Deleted

## 2012-07-10 MED ORDER — POTASSIUM CHLORIDE CRYS ER 20 MEQ PO TBCR: EXTENDED_RELEASE_TABLET | ORAL | Status: DC

## 2012-07-10 MED ORDER — METFORMIN HCL 1000 MG PO TABS: 1000.0000 mg | ORAL_TABLET | Freq: Two times a day (BID) | ORAL | Status: DC

## 2012-07-10 MED ORDER — HYDROCHLOROTHIAZIDE 25 MG PO TABS: 25.0000 mg | ORAL_TABLET | Freq: Every day | ORAL | Status: DC

## 2012-07-10 MED ORDER — INSULIN GLARGINE 100 UNIT/ML ~~LOC~~ SOLN: 40.0000 [IU] | Freq: Every day | SUBCUTANEOUS | Status: DC

## 2012-07-10 NOTE — Telephone Encounter (Signed)
Requested Prescriptions   Pending Prescriptions Disp Refills  . insulin glargine (LANTUS) 100 UNIT/ML injection 10 mL     Sig: Inject 0.4 mLs (40 Units total) into the skin daily.   Wyatt Haste, RN-BSN

## 2012-07-10 NOTE — Telephone Encounter (Signed)
Requested Prescriptions   Pending Prescriptions Disp Refills  . hydrochlorothiazide (HYDRODIURIL) 25 MG tablet 30 tablet 5    Sig: Take 1 tablet (25 mg total) by mouth daily.  . potassium chloride SA (KLOR-CON M20) 20 MEQ tablet 30 tablet 0  . metFORMIN (GLUCOPHAGE) 1000 MG tablet 60 tablet 5    Sig: Take 1 tablet (1,000 mg total) by mouth 2 (two) times daily with a meal.   Wyatt Haste, RN-BSN

## 2012-07-26 ENCOUNTER — Telehealth: Payer: Self-pay | Admitting: Home Health Services

## 2012-07-26 NOTE — Telephone Encounter (Signed)
Spoke with Kathryn Crosby Pt reports feeling: good Pt reports taking medications yes Patient missed taking medications 0 days this week.   Pt is self monitoring their DM:yes medication compliance: compliant all of the time, diabetic diet compliance: compliant most of the time, home glucose monitoring: is performed regularly, fasting values range 88-138  Last weeks goals:walk 3-4 x a week, monitor carb intake Pt was successful with last week's goals: no- pt reports not walking recently (brother is in hospital) This weeks goals: 3-4x week walking, limit carb intake Pt's overall goal is: dm management, weight loss  Has fu appointment with PCP 7/11.

## 2012-08-06 ENCOUNTER — Other Ambulatory Visit: Payer: Self-pay | Admitting: *Deleted

## 2012-08-06 DIAGNOSIS — E119 Type 2 diabetes mellitus without complications: Secondary | ICD-10-CM

## 2012-08-06 MED ORDER — ACCU-CHEK FASTCLIX LANCETS MISC: 1.0000 | Freq: Two times a day (BID) | Status: DC

## 2012-08-06 MED ORDER — GLIMEPIRIDE 2 MG PO TABS: ORAL_TABLET | ORAL | Status: DC

## 2012-08-10 ENCOUNTER — Encounter: Payer: Self-pay | Admitting: Family Medicine

## 2012-08-10 ENCOUNTER — Ambulatory Visit (INDEPENDENT_AMBULATORY_CARE_PROVIDER_SITE_OTHER): Payer: Medicare Other | Admitting: Family Medicine

## 2012-08-10 VITALS — BP 144/94 | HR 75 | Temp 98.1°F | Wt 278.0 lb

## 2012-08-10 DIAGNOSIS — M25569 Pain in unspecified knee: Secondary | ICD-10-CM | POA: Diagnosis not present

## 2012-08-10 DIAGNOSIS — IMO0001 Reserved for inherently not codable concepts without codable children: Secondary | ICD-10-CM

## 2012-08-10 DIAGNOSIS — IMO0002 Reserved for concepts with insufficient information to code with codable children: Secondary | ICD-10-CM

## 2012-08-10 DIAGNOSIS — R209 Unspecified disturbances of skin sensation: Secondary | ICD-10-CM | POA: Diagnosis not present

## 2012-08-10 DIAGNOSIS — E1149 Type 2 diabetes mellitus with other diabetic neurological complication: Secondary | ICD-10-CM | POA: Diagnosis not present

## 2012-08-10 DIAGNOSIS — E1165 Type 2 diabetes mellitus with hyperglycemia: Secondary | ICD-10-CM

## 2012-08-10 DIAGNOSIS — R202 Paresthesia of skin: Secondary | ICD-10-CM | POA: Insufficient documentation

## 2012-08-10 LAB — BASIC METABOLIC PANEL
BUN: 15 mg/dL (ref 6–23)
CO2: 29 mEq/L (ref 19–32)
Chloride: 103 mEq/L (ref 96–112)
Creat: 0.88 mg/dL (ref 0.50–1.10)
Potassium: 3.4 mEq/L — ABNORMAL LOW (ref 3.5–5.3)

## 2012-08-10 LAB — LIPID PANEL
Cholesterol: 171 mg/dL (ref 0–200)
LDL Cholesterol: 118 mg/dL — ABNORMAL HIGH (ref 0–99)
Triglycerides: 124 mg/dL (ref <150)
VLDL: 25 mg/dL (ref 0–40)

## 2012-08-10 MED ORDER — ONDANSETRON HCL 4 MG PO TABS: 4.0000 mg | ORAL_TABLET | Freq: Three times a day (TID) | ORAL | Status: DC | PRN

## 2012-08-10 MED ORDER — ACETAMINOPHEN ER 650 MG PO TBCR: 650.0000 mg | EXTENDED_RELEASE_TABLET | Freq: Three times a day (TID) | ORAL | Status: DC | PRN

## 2012-08-10 NOTE — Assessment & Plan Note (Signed)
Pain secondary to osteoarthritis. Encouraged to keep walking and take Tylenol as needed for pain. Declined injection today, but can offer again at next visit. F/u in 3 months.

## 2012-08-10 NOTE — Assessment & Plan Note (Signed)
A1c at goal today! Patient congratulated! She is doing very well with diet and current regimen. No changes today. Follow up with Arlys John. Bmet and Lipids today. F/u with me in 3 months or sooner if needed.

## 2012-08-10 NOTE — Assessment & Plan Note (Signed)
Patient declined gabapentin today. Will consider this if it gets worse. A1C controlled, so maybe there will be some improvement.

## 2012-08-10 NOTE — Patient Instructions (Signed)
It was good to see you today!  CONGRATS ON YOUR A1C!! I am SO happy for you!  Please make an appointment for [facility]. Take Tylenol 650mg  (arthritis strength) for your pain. If you want to take something for the tingling, I can prescribe you a medication. Just let me know.  I will see you back in 3 months, or sooner if you need anything.  Logan Vegh M. Rianna Lukes, M.D.

## 2012-08-10 NOTE — Progress Notes (Signed)
Patient ID: Kathryn Crosby, female   DOB: [birth year], 52 y.o.   MRN: [id number]  [name] Jimel Myler M. Drae Mitzel, MD Phone: [phone]   Subjective: HPI: Patient is a 52 y.o. female presenting to clinic today for follow up appointment. Concerns today include nausea in the morning  1. Nausea - Every morning. Post-menopausal and states she could not be pregnant. Feels like it is due to dental problems, needs to see dentist. Has history of GERD. Takes Prilosec qam. Nausea resolves on its own and does not have vomiting.  2. Diabetes- Followed closely by Dr. Raymondo Band and Arlys John. Feels like she is doing much better. High at home: 140 Low at home: 61, but no symptomatic lows Taking medications: Victoza, Lantus, Amaryl, Metformin. Doing ok with the injections Side effects: None now, had a local reaction to Victoza ROS: denies fever, chills, dizziness, LOC, polyuria, polydipsia, numbness or tingling in extremities or chest pain. Last eye exam: Jan 2014 Last foot exam: This year, recommended Gabapentin Nephropathy screen indicated?: On ACE, but needs Bmet toady  3. Pain in joints/tingling in feet- Pain in right knee, which is chronic but worsening. Also complains of tingling in feets, and cramping in hands. Tingling in feet for a while. Recommended to start on Gabapentin but she does not want to do this right now due to reported side effects. Not taking anything for knee pain at this time. Walking more than previously. Was previously followed by Ortho and got shots. Last shot was over a year ago but does not want one today   History Reviewed: Non smoker. Health Maintenance: Needs mammo  ROS: Please see HPI above.  Objective: Office vital signs reviewed. BP 144/94  Pulse 75  Temp(Src) 98.1 F (36.7 C) (Oral)  Wt 278 lb (126.1 kg)  BMI 42.87 kg/m2  Physical Examination:  General: Awake, alert. NAD. Pleasant and happy HEENT: Atraumatic, normocephalic. MMM. No  obvious dental abnormalitiy Pulm: CTAB, no wheezes Cardio: RRR, no murmurs appreciated Abdomen: obese,+BS, soft, nontender, nondistended Extremities: Trace edema. Sensitive to light touch Neuro: Grossly intact  Assessment: 52 y.o. female follow up appointment  Plan: See Problem List and After Visit Summary

## 2012-08-13 ENCOUNTER — Telehealth: Payer: Self-pay | Admitting: Home Health Services

## 2012-08-13 NOTE — Telephone Encounter (Signed)
Spoke with Kathryn Crosby Pt reports feeling: excellent Pt reports taking medications yes Patient missed taking medications 0 days this week.   Pt is self monitoring their DM:yes medication compliance: compliant all of the time, diabetic diet compliance: compliant most of the time  Pt called and was excited about A1C being a 6.7.  I congratulated her and continue to set goals for next week.  Pt reports walking 3-4x a week, limiting carbs.  We talked about what were common carbs and portion size. Pt expressed a desire to eat only vegetables for the next week. We talked about ways to substitute protein instead of eating meat, such as tofu, rice/beans, dairy, nuts.  Pt was successful with last week's goals: yes This weeks goals: walk 3-4 times, limit meat products, eat more vegetables. Pt's overall goal is: dm management, weight loss

## 2012-08-16 ENCOUNTER — Other Ambulatory Visit: Payer: Self-pay | Admitting: Family Medicine

## 2012-08-19 ENCOUNTER — Other Ambulatory Visit: Payer: Self-pay | Admitting: Family Medicine

## 2012-08-22 ENCOUNTER — Telehealth: Payer: Self-pay | Admitting: Family Medicine

## 2012-08-22 ENCOUNTER — Encounter: Payer: Self-pay | Admitting: Family Medicine

## 2012-08-22 MED ORDER — SIMVASTATIN 40 MG PO TABS: 40.0000 mg | ORAL_TABLET | Freq: Every evening | ORAL | Status: DC

## 2012-08-22 NOTE — Telephone Encounter (Signed)
Can you please let Kathryn Crosby know that I attempted to call about these labs but I was unable to get in touch with her. Her cholesterol is a little high, and with her diabetes and high blood pressure I recommend she start taking a statin medication to reduce her "bad cholesterol." I will send her a letter with her lab values as well, but Simvastatin 40mg  has been called to [facility]. She should take one tab qhs.  Thank you! Gisele Pack M. Ramla Hase, M.D.

## 2012-08-22 NOTE — Telephone Encounter (Signed)
Left message for pt to call back.  Will send out letter today with same message on it but please give to pt is she calls back.  Thanks [employer], CMA.

## 2012-09-03 ENCOUNTER — Other Ambulatory Visit: Payer: Self-pay | Admitting: Family Medicine

## 2012-09-03 DIAGNOSIS — Z1231 Encounter for screening mammogram for malignant neoplasm of breast: Secondary | ICD-10-CM

## 2012-09-07 ENCOUNTER — Other Ambulatory Visit: Payer: Self-pay | Admitting: Family Medicine

## 2012-10-08 ENCOUNTER — Inpatient Hospital Stay (HOSPITAL_BASED_OUTPATIENT_CLINIC_OR_DEPARTMENT_OTHER): Admission: RE | Admit: 2012-10-08 | Payer: Medicare Other | Source: Ambulatory Visit

## 2012-10-09 ENCOUNTER — Ambulatory Visit (INDEPENDENT_AMBULATORY_CARE_PROVIDER_SITE_OTHER): Payer: Medicare Other

## 2012-10-09 DIAGNOSIS — Z1231 Encounter for screening mammogram for malignant neoplasm of breast: Secondary | ICD-10-CM | POA: Diagnosis not present

## 2012-10-09 DIAGNOSIS — R928 Other abnormal and inconclusive findings on diagnostic imaging of breast: Secondary | ICD-10-CM

## 2012-10-10 ENCOUNTER — Inpatient Hospital Stay (HOSPITAL_BASED_OUTPATIENT_CLINIC_OR_DEPARTMENT_OTHER): Admission: RE | Admit: 2012-10-10 | Payer: Medicare Other | Source: Ambulatory Visit

## 2012-10-12 ENCOUNTER — Other Ambulatory Visit: Payer: Self-pay | Admitting: Family Medicine

## 2012-10-12 DIAGNOSIS — R928 Other abnormal and inconclusive findings on diagnostic imaging of breast: Secondary | ICD-10-CM

## 2012-10-18 ENCOUNTER — Other Ambulatory Visit: Payer: Self-pay | Admitting: Family Medicine

## 2012-10-18 ENCOUNTER — Ambulatory Visit
Admission: RE | Admit: 2012-10-18 | Discharge: 2012-10-18 | Disposition: A | Discharge status: Home / Self Care | Payer: Medicare Other | Source: Ambulatory Visit | Attending: *Deleted | Admitting: *Deleted

## 2012-10-18 DIAGNOSIS — R599 Enlarged lymph nodes, unspecified: Secondary | ICD-10-CM | POA: Diagnosis not present

## 2012-10-18 DIAGNOSIS — R928 Other abnormal and inconclusive findings on diagnostic imaging of breast: Secondary | ICD-10-CM

## 2012-10-19 ENCOUNTER — Other Ambulatory Visit: Payer: Self-pay | Admitting: Family Medicine

## 2012-10-19 DIAGNOSIS — IMO0002 Reserved for concepts with insufficient information to code with codable children: Secondary | ICD-10-CM

## 2012-10-19 DIAGNOSIS — E1165 Type 2 diabetes mellitus with hyperglycemia: Secondary | ICD-10-CM

## 2012-10-19 MED ORDER — LIRAGLUTIDE 18 MG/3ML ~~LOC~~ SOPN: 1.2000 mg | PEN_INJECTOR | Freq: Every day | SUBCUTANEOUS | Status: DC

## 2012-10-30 ENCOUNTER — Ambulatory Visit
Admission: RE | Admit: 2012-10-30 | Discharge: 2012-10-30 | Disposition: A | Discharge status: Home / Self Care | Payer: Medicare Other | Source: Ambulatory Visit | Attending: *Deleted | Admitting: *Deleted

## 2012-10-30 ENCOUNTER — Other Ambulatory Visit: Payer: Self-pay | Admitting: Family Medicine

## 2012-10-30 DIAGNOSIS — R928 Other abnormal and inconclusive findings on diagnostic imaging of breast: Secondary | ICD-10-CM

## 2012-10-30 DIAGNOSIS — R599 Enlarged lymph nodes, unspecified: Secondary | ICD-10-CM | POA: Diagnosis not present

## 2012-11-07 ENCOUNTER — Ambulatory Visit: Payer: Medicare Other | Admitting: Family Medicine

## 2012-11-19 ENCOUNTER — Encounter: Payer: Self-pay | Admitting: Family Medicine

## 2012-11-19 ENCOUNTER — Ambulatory Visit (INDEPENDENT_AMBULATORY_CARE_PROVIDER_SITE_OTHER): Payer: Medicare Other | Admitting: Family Medicine

## 2012-11-19 VITALS — BP 144/92 | HR 76 | Temp 98.8°F | Ht 67.5 in | Wt 275.0 lb

## 2012-11-19 DIAGNOSIS — N200 Calculus of kidney: Secondary | ICD-10-CM

## 2012-11-19 DIAGNOSIS — IMO0001 Reserved for inherently not codable concepts without codable children: Secondary | ICD-10-CM | POA: Diagnosis not present

## 2012-11-19 DIAGNOSIS — Z23 Encounter for immunization: Secondary | ICD-10-CM | POA: Diagnosis not present

## 2012-11-19 DIAGNOSIS — IMO0002 Reserved for concepts with insufficient information to code with codable children: Secondary | ICD-10-CM

## 2012-11-19 DIAGNOSIS — M25569 Pain in unspecified knee: Secondary | ICD-10-CM

## 2012-11-19 DIAGNOSIS — E1165 Type 2 diabetes mellitus with hyperglycemia: Secondary | ICD-10-CM

## 2012-11-19 LAB — POCT GLYCOSYLATED HEMOGLOBIN (HGB A1C): Hemoglobin A1C: 7

## 2012-11-19 MED ORDER — TRAMADOL HCL 50 MG PO TABS: 50.0000 mg | ORAL_TABLET | Freq: Three times a day (TID) | ORAL | Status: DC | PRN

## 2012-11-19 MED ORDER — ONDANSETRON HCL 4 MG PO TABS: 4.0000 mg | ORAL_TABLET | Freq: Three times a day (TID) | ORAL | Status: DC | PRN

## 2012-11-19 NOTE — Progress Notes (Signed)
Patient ID: Kathryn Crosby, female   DOB: 07-19-1960, 52 y.o.   MRN: [id number]  [facility] Kathryn Mercier M. [name], MD Phone: [phone]   Subjective: HPI: Patient is a 52 y.o. female presenting to clinic today for follow up appointment. Concerns today include pain in right side  1. Diabetes:  High at home: 202 Low at home: 55 (some mild symptoms) Taking medications: Lantus, Victoza, Amaryl and Metformin Side effects: None ROS: denies fever, chills, dizziness, LOC, polyuria, polydipsia, numbness or tingling in extremities or chest pain. Last eye exam: Has appt in Feb with Dr. Dione Crosby Last foot exam: Earlier this year Nephropathy screen indicated?: On lisinopril Last flu, zoster and/or pneumovax: Needs flu shot today.  2. Side pain: Feel like typical kidney stone x 1 month. Trying to "tough it out" until her Urology appointment at [facility] in November. She typically gets some relief with Tramadol and Zofran. She does not want UA today, or further work up until she sees urology.  3. Knee pain: Needs new referral to Dr. Ardis Crosby in [place]. She has been to him for a while. Pain is a chronic problem with her and does interfere with her daily life. Dr. Ardis Crosby needed new referral to schedule follow up. She also has chronic left ankle pain after ankle fractures years ago.    History Reviewed: Non smoker. Health Maintenance: Flu today.  ROS: Please see HPI above.  Objective: Office vital signs reviewed. BP 144/92  Pulse 76  Temp(Src) 98.8 F (37.1 C) (Oral)  Ht 5' 7.5" (1.715 m)  Wt 275 lb (124.739 kg)  BMI 42.41 kg/m2  Physical Examination:  General: Awake, alert. NAD HEENT: Atraumatic, normocephalic. MMM Neck: No masses palpated. No LAD Pulm: CTAB, no wheezes Cardio: RRR, no murmurs appreciated Abdomen: Obese, +BS, soft, nontender, nondistended. No CVA or suprapubic tenderness. Extremities: Trace edema. TTP of both knees, +crepitus. Left ankle swollen with  0.5cm bullous lesion without erythema. Normal gait. Neuro: Grossly intact  Assessment: 52 y.o. female follow up  Plan: See Problem List and After Visit Summary

## 2012-11-19 NOTE — Patient Instructions (Signed)
It was great to see you today!  I have refilled your Tramadol and Zofran until you can get to [facility]. Your blood pressure and blood sugar were great today! Keep up the good work.  When you need refills, please let me know.  I will see you back in February, or sooner if needed!   [place] Dani M. Isham Smitherman, M.D.

## 2012-11-20 ENCOUNTER — Telehealth: Payer: Self-pay | Admitting: *Deleted

## 2012-11-20 ENCOUNTER — Other Ambulatory Visit: Payer: Self-pay | Admitting: Family Medicine

## 2012-11-20 MED ORDER — GLUCOSE BLOOD VI STRP: ORAL_STRIP | Status: DC

## 2012-11-20 NOTE — Telephone Encounter (Signed)
LMOVM for pt to return call.  Please inform of the below when she calls back:   Appt with Dr. Ardis Hughs 11/27/2012 @ 10am [facility] [address] [place], [place] [address]        [phone] (Office)

## 2012-11-20 NOTE — Assessment & Plan Note (Signed)
Referred back to ortho. No new treatments at this time.

## 2012-11-20 NOTE — Assessment & Plan Note (Signed)
A1C at goal today. Doing well on current regimen, no changes at this time. Will need full labs at next visit.

## 2012-11-20 NOTE — Assessment & Plan Note (Signed)
Follow up with urology. Given Tramadol and Zofran to help with symptoms until she can get in with them. If she has worsening pain, gross hematuria, or any other concerns, return to clinic to see me.

## 2012-11-20 NOTE — Telephone Encounter (Signed)
Patient informed. 

## 2012-11-27 DIAGNOSIS — M238X9 Other internal derangements of unspecified knee: Secondary | ICD-10-CM | POA: Diagnosis not present

## 2012-11-27 DIAGNOSIS — M1710 Unilateral primary osteoarthritis, unspecified knee: Secondary | ICD-10-CM | POA: Diagnosis not present

## 2012-11-27 DIAGNOSIS — M25569 Pain in unspecified knee: Secondary | ICD-10-CM | POA: Diagnosis not present

## 2012-11-27 DIAGNOSIS — IMO0002 Reserved for concepts with insufficient information to code with codable children: Secondary | ICD-10-CM | POA: Diagnosis not present

## 2012-11-29 DIAGNOSIS — G4733 Obstructive sleep apnea (adult) (pediatric): Secondary | ICD-10-CM | POA: Diagnosis not present

## 2012-11-29 DIAGNOSIS — E669 Obesity, unspecified: Secondary | ICD-10-CM | POA: Diagnosis not present

## 2012-11-30 DIAGNOSIS — G4733 Obstructive sleep apnea (adult) (pediatric): Secondary | ICD-10-CM | POA: Diagnosis not present

## 2012-11-30 DIAGNOSIS — G4710 Hypersomnia, unspecified: Secondary | ICD-10-CM | POA: Diagnosis not present

## 2012-12-10 ENCOUNTER — Telehealth: Payer: Self-pay

## 2012-12-10 MED ORDER — GLUCOSE BLOOD VI STRP: ORAL_STRIP | Status: DC

## 2012-12-10 NOTE — Telephone Encounter (Signed)
Pt informed. [name], Kathryn Crosby  

## 2012-12-10 NOTE — Telephone Encounter (Signed)
Accu-check aviva strips sent to pharmacy. Hopefully that will fit her meter.  Thanks, [employer]. Farrel Guimond, M.D.

## 2012-12-10 NOTE — Telephone Encounter (Signed)
Patient lost glucose meter and is using her back up one. She now needs test strips for [employer]. Please call patient once completed.

## 2012-12-11 DIAGNOSIS — M1710 Unilateral primary osteoarthritis, unspecified knee: Secondary | ICD-10-CM | POA: Diagnosis not present

## 2012-12-11 DIAGNOSIS — IMO0002 Reserved for concepts with insufficient information to code with codable children: Secondary | ICD-10-CM | POA: Diagnosis not present

## 2012-12-21 ENCOUNTER — Other Ambulatory Visit: Payer: Self-pay | Admitting: Family Medicine

## 2012-12-26 DIAGNOSIS — N200 Calculus of kidney: Secondary | ICD-10-CM | POA: Diagnosis not present

## 2012-12-26 DIAGNOSIS — Z87442 Personal history of urinary calculi: Secondary | ICD-10-CM | POA: Diagnosis not present

## 2012-12-31 DIAGNOSIS — Z8371 Family history of colonic polyps: Secondary | ICD-10-CM | POA: Diagnosis not present

## 2012-12-31 DIAGNOSIS — Z1211 Encounter for screening for malignant neoplasm of colon: Secondary | ICD-10-CM | POA: Diagnosis not present

## 2013-01-09 DIAGNOSIS — E878 Other disorders of electrolyte and fluid balance, not elsewhere classified: Secondary | ICD-10-CM | POA: Diagnosis not present

## 2013-01-09 DIAGNOSIS — N200 Calculus of kidney: Secondary | ICD-10-CM | POA: Diagnosis not present

## 2013-01-09 DIAGNOSIS — R9431 Abnormal electrocardiogram [ECG] [EKG]: Secondary | ICD-10-CM | POA: Diagnosis not present

## 2013-01-14 DIAGNOSIS — Z91041 Radiographic dye allergy status: Secondary | ICD-10-CM | POA: Diagnosis not present

## 2013-01-14 DIAGNOSIS — I10 Essential (primary) hypertension: Secondary | ICD-10-CM | POA: Diagnosis not present

## 2013-01-14 DIAGNOSIS — Z886 Allergy status to analgesic agent status: Secondary | ICD-10-CM | POA: Diagnosis not present

## 2013-01-14 DIAGNOSIS — Z91013 Allergy to seafood: Secondary | ICD-10-CM | POA: Diagnosis not present

## 2013-01-14 DIAGNOSIS — K219 Gastro-esophageal reflux disease without esophagitis: Secondary | ICD-10-CM | POA: Diagnosis not present

## 2013-01-14 DIAGNOSIS — E119 Type 2 diabetes mellitus without complications: Secondary | ICD-10-CM | POA: Diagnosis not present

## 2013-01-14 DIAGNOSIS — Z794 Long term (current) use of insulin: Secondary | ICD-10-CM | POA: Diagnosis not present

## 2013-01-14 DIAGNOSIS — N200 Calculus of kidney: Secondary | ICD-10-CM | POA: Diagnosis not present

## 2013-01-14 DIAGNOSIS — G4733 Obstructive sleep apnea (adult) (pediatric): Secondary | ICD-10-CM | POA: Diagnosis not present

## 2013-01-23 ENCOUNTER — Other Ambulatory Visit: Payer: Self-pay | Admitting: Family Medicine

## 2013-01-30 DIAGNOSIS — E119 Type 2 diabetes mellitus without complications: Secondary | ICD-10-CM | POA: Diagnosis not present

## 2013-01-30 DIAGNOSIS — N200 Calculus of kidney: Secondary | ICD-10-CM | POA: Diagnosis not present

## 2013-02-13 DIAGNOSIS — E119 Type 2 diabetes mellitus without complications: Secondary | ICD-10-CM | POA: Diagnosis not present

## 2013-02-14 ENCOUNTER — Other Ambulatory Visit: Payer: Self-pay | Admitting: Family Medicine

## 2013-02-20 DIAGNOSIS — Z90710 Acquired absence of both cervix and uterus: Secondary | ICD-10-CM | POA: Diagnosis not present

## 2013-02-20 DIAGNOSIS — IMO0002 Reserved for concepts with insufficient information to code with codable children: Secondary | ICD-10-CM | POA: Diagnosis not present

## 2013-02-20 DIAGNOSIS — R109 Unspecified abdominal pain: Secondary | ICD-10-CM | POA: Diagnosis not present

## 2013-02-20 DIAGNOSIS — M5137 Other intervertebral disc degeneration, lumbosacral region: Secondary | ICD-10-CM | POA: Diagnosis not present

## 2013-02-20 DIAGNOSIS — N200 Calculus of kidney: Secondary | ICD-10-CM | POA: Diagnosis not present

## 2013-02-27 DIAGNOSIS — N200 Calculus of kidney: Secondary | ICD-10-CM | POA: Diagnosis not present

## 2013-03-09 ENCOUNTER — Other Ambulatory Visit: Payer: Self-pay | Admitting: Family Medicine

## 2013-03-21 DIAGNOSIS — H04129 Dry eye syndrome of unspecified lacrimal gland: Secondary | ICD-10-CM | POA: Diagnosis not present

## 2013-03-21 DIAGNOSIS — H2510 Age-related nuclear cataract, unspecified eye: Secondary | ICD-10-CM | POA: Diagnosis not present

## 2013-03-21 DIAGNOSIS — H05249 Constant exophthalmos, unspecified eye: Secondary | ICD-10-CM | POA: Diagnosis not present

## 2013-03-21 DIAGNOSIS — E119 Type 2 diabetes mellitus without complications: Secondary | ICD-10-CM | POA: Diagnosis not present

## 2013-03-29 ENCOUNTER — Encounter: Payer: Self-pay | Admitting: Family Medicine

## 2013-03-29 ENCOUNTER — Other Ambulatory Visit: Payer: Self-pay | Admitting: *Deleted

## 2013-03-29 ENCOUNTER — Ambulatory Visit (INDEPENDENT_AMBULATORY_CARE_PROVIDER_SITE_OTHER): Payer: Medicare Other | Admitting: Family Medicine

## 2013-03-29 VITALS — BP 144/89 | HR 69 | Temp 97.7°F | Ht 67.5 in | Wt 280.0 lb

## 2013-03-29 DIAGNOSIS — N200 Calculus of kidney: Secondary | ICD-10-CM | POA: Diagnosis not present

## 2013-03-29 DIAGNOSIS — R51 Headache: Secondary | ICD-10-CM

## 2013-03-29 DIAGNOSIS — IMO0001 Reserved for inherently not codable concepts without codable children: Secondary | ICD-10-CM

## 2013-03-29 DIAGNOSIS — I10 Essential (primary) hypertension: Secondary | ICD-10-CM

## 2013-03-29 DIAGNOSIS — R609 Edema, unspecified: Secondary | ICD-10-CM | POA: Diagnosis not present

## 2013-03-29 DIAGNOSIS — IMO0002 Reserved for concepts with insufficient information to code with codable children: Secondary | ICD-10-CM

## 2013-03-29 DIAGNOSIS — E1165 Type 2 diabetes mellitus with hyperglycemia: Secondary | ICD-10-CM

## 2013-03-29 DIAGNOSIS — R600 Localized edema: Secondary | ICD-10-CM

## 2013-03-29 LAB — CBC
HCT: 37.3 % (ref 36.0–46.0)
HEMOGLOBIN: 12.3 g/dL (ref 12.0–15.0)
MCH: 26.5 pg (ref 26.0–34.0)
MCHC: 33 g/dL (ref 30.0–36.0)
MCV: 80.2 fL (ref 78.0–100.0)
Platelets: 364 10*3/uL (ref 150–400)
RBC: 4.65 MIL/uL (ref 3.87–5.11)
RDW: 15.9 % — ABNORMAL HIGH (ref 11.5–15.5)
WBC: 10.5 10*3/uL (ref 4.0–10.5)

## 2013-03-29 LAB — COMPREHENSIVE METABOLIC PANEL
ALT: 8 U/L (ref 0–35)
AST: 9 U/L (ref 0–37)
Albumin: 3.8 g/dL (ref 3.5–5.2)
Alkaline Phosphatase: 77 U/L (ref 39–117)
BILIRUBIN TOTAL: 0.5 mg/dL (ref 0.2–1.2)
BUN: 16 mg/dL (ref 6–23)
CALCIUM: 9.1 mg/dL (ref 8.4–10.5)
CHLORIDE: 104 meq/L (ref 96–112)
CO2: 29 meq/L (ref 19–32)
Creat: 0.83 mg/dL (ref 0.50–1.10)
GLUCOSE: 120 mg/dL — AB (ref 70–99)
Potassium: 3.3 mEq/L — ABNORMAL LOW (ref 3.5–5.3)
SODIUM: 142 meq/L (ref 135–145)
TOTAL PROTEIN: 7 g/dL (ref 6.0–8.3)

## 2013-03-29 LAB — POCT GLYCOSYLATED HEMOGLOBIN (HGB A1C): Hemoglobin A1C: 7.2

## 2013-03-29 MED ORDER — AMLODIPINE BESYLATE 5 MG PO TABS: 5.0000 mg | ORAL_TABLET | Freq: Every day | ORAL | Status: DC

## 2013-03-29 MED ORDER — SUMATRIPTAN SUCCINATE 25 MG PO TABS: 25.0000 mg | ORAL_TABLET | ORAL | Status: DC | PRN

## 2013-03-29 NOTE — Progress Notes (Signed)
Patient ID: Kathryn Crosby, female   DOB: 03/20/1960, 53 y.o.   MRN: [id number]    Subjective: HPI: Patient is a 53 y.o. female presenting to clinic today for follow up appointment. Concerns today include DM, Headaches, swollen ankles  1. Diabetes:  High at home: 189 Low at home: 56 Taking medications: Lantus 42 units, Victoza and Glipizide. Does vary her doses.  Side effects: No symptomatic lows ROS: denies fever, chills, dizziness, LOC, polyuria, polydipsia, numbness or tingling in extremities or chest pain. Last eye exam: Dr. Katy Fitch last weel Last foot exam: Needs today Nephropathy screen indicated?: On ACE and ARB  Last flu, zoster and/or pneumovax:  2. Headaches: Reports daily headache, wakes up with headache every morning. She takes Tylenol and Excedrin that does not help much. Headache hurts in front. Quiet and rest make it better. Has history of migraines, treated with imitrex and zomig. She does report some photophobia and aversion to loud noises. Wears CPAP now for sleep apnea but her doctor has reduced her time per night.  3. Edema: History of difficult to control HTN. On multiple medications, including HCTZ, Lisinopril, Losartan, Norvasc.  Wears compression socks that help some but not enough. Edema worse after surgery. She props up her legs and the swelling does not go down. Last echo in 2011 for suspected CHF was normal. Saw [place] cards in 2011. No CP, no SOB. Reports weight goes up and down.  4. Kidney stones:  Had outpatient surgery in December to remove stones from ureter. She has more stones in the kidney that she is going to leave alone. She was started on Potassium Citrate BID. Returns to see them at [facility] in July.    History Reviewed: Non smoker. Health Maintenance: UTD  ROS: Please see HPI above.  Objective: Office vital signs reviewed. BP 144/89  Pulse 69  Temp(Src) 97.7 F (36.5 C) (Oral)  Ht 5' 7.5" (1.715 m)  Wt 280 lb (127.007 kg)  BMI 43.18  kg/m2  Physical Examination:  General: Awake, alert. NAD HEENT: Atraumatic, normocephalic Neck: No masses palpated. No LAD Pulm: CTAB, no wheezes, no crackles Cardio: RRR, no murmurs appreciated Abdomen:+BS, soft, nontender, nondistended Extremities: 2+edema Neuro: Grossly intact  Assessment: 53 y.o. female follow up appointment  Plan: See Problem List and After Visit Summary

## 2013-03-29 NOTE — Patient Instructions (Signed)
For diabetes, adjust your lantus as needed. Good job on your A1C! For your leg swelling, we will check your labs and an echo. Also, get some new compression stockings when you can. I will give you a new prescription today. For your headaches, I have sent in Imitrex to take only for severe headaches. Use aleve if it is not too bad.  I will see you back in 4 weeks for a check up or sooner if you need anything.  Lashane Whelpley M. Ayelet Gruenewald, M.D.

## 2013-03-31 DIAGNOSIS — R609 Edema, unspecified: Secondary | ICD-10-CM | POA: Insufficient documentation

## 2013-03-31 NOTE — Assessment & Plan Note (Signed)
Could be related to decreased amount of time on CPAP at night. Encouraged her to talk to her sleep doctor about that. For now, given her history of migraine and no red flag symptoms, will prescribe Imitrex prn for severe headaches. Con't blood pressure medications, and Tylenol or Aleve prn headaches. F/u in 1 month or sooner if headaches do not resolve.

## 2013-03-31 NOTE — Assessment & Plan Note (Signed)
A1C is 7.2. Con't current regimen. Will check labs today. Encouraged to continue to work on diet and exercise.

## 2013-03-31 NOTE — Assessment & Plan Note (Signed)
History of LE edema, now worsening. She has had echo in the past, but given her difficult to control HTN, I would like to rule out CHF as a cause of her edema. Also, will check kidney function, liver function and for anemia as possible causes. Most likely this is due to her venous insufficiency. Will given Rx for tighter compression socks. F/u in 1 month.

## 2013-03-31 NOTE — Assessment & Plan Note (Signed)
Followed by [facility]. Now on Potassium citrate so we will stop KCl. Check Bmet today.

## 2013-04-05 ENCOUNTER — Ambulatory Visit (HOSPITAL_COMMUNITY)
Admission: RE | Admit: 2013-04-05 | Discharge: 2013-04-05 | Disposition: A | Discharge status: Home / Self Care | Payer: Medicare Other | Source: Ambulatory Visit | Attending: Family Medicine | Admitting: Family Medicine

## 2013-04-05 ENCOUNTER — Ambulatory Visit (HOSPITAL_COMMUNITY): Payer: Medicare Other

## 2013-04-05 ENCOUNTER — Other Ambulatory Visit: Payer: Self-pay | Admitting: *Deleted

## 2013-04-05 DIAGNOSIS — E119 Type 2 diabetes mellitus without complications: Secondary | ICD-10-CM

## 2013-04-05 DIAGNOSIS — R609 Edema, unspecified: Secondary | ICD-10-CM | POA: Insufficient documentation

## 2013-04-05 DIAGNOSIS — R600 Localized edema: Secondary | ICD-10-CM

## 2013-04-05 DIAGNOSIS — I10 Essential (primary) hypertension: Secondary | ICD-10-CM

## 2013-04-05 DIAGNOSIS — I379 Nonrheumatic pulmonary valve disorder, unspecified: Secondary | ICD-10-CM | POA: Diagnosis not present

## 2013-04-05 MED ORDER — ACCU-CHEK FASTCLIX LANCETS MISC: 1.0000 | Freq: Two times a day (BID) | Status: DC

## 2013-04-05 NOTE — Progress Notes (Signed)
  Echocardiogram 2D Echocardiogram has been performed.  Kathryn Crosby 04/05/2013, 10:46 AM

## 2013-04-08 ENCOUNTER — Telehealth: Payer: Self-pay | Admitting: *Deleted

## 2013-04-08 NOTE — Telephone Encounter (Signed)
[facility] calling to request NPI # for visit on 01/14/13 for calculus of kidney.  NPI # given.  Burna Forts, BSN, RN-BC

## 2013-05-13 ENCOUNTER — Other Ambulatory Visit: Payer: Self-pay | Admitting: *Deleted

## 2013-05-13 MED ORDER — ONDANSETRON HCL 4 MG PO TABS: ORAL_TABLET | ORAL | Status: DC

## 2013-05-18 ENCOUNTER — Other Ambulatory Visit: Payer: Self-pay | Admitting: Family Medicine

## 2013-05-23 DIAGNOSIS — Z1211 Encounter for screening for malignant neoplasm of colon: Secondary | ICD-10-CM | POA: Diagnosis not present

## 2013-05-23 DIAGNOSIS — K5730 Diverticulosis of large intestine without perforation or abscess without bleeding: Secondary | ICD-10-CM | POA: Diagnosis not present

## 2013-05-23 DIAGNOSIS — Z8371 Family history of colonic polyps: Secondary | ICD-10-CM | POA: Diagnosis not present

## 2013-05-23 DIAGNOSIS — K649 Unspecified hemorrhoids: Secondary | ICD-10-CM | POA: Diagnosis not present

## 2013-05-30 DIAGNOSIS — E1149 Type 2 diabetes mellitus with other diabetic neurological complication: Secondary | ICD-10-CM | POA: Diagnosis not present

## 2013-05-30 DIAGNOSIS — R0989 Other specified symptoms and signs involving the circulatory and respiratory systems: Secondary | ICD-10-CM | POA: Diagnosis not present

## 2013-05-30 DIAGNOSIS — R0609 Other forms of dyspnea: Secondary | ICD-10-CM | POA: Diagnosis not present

## 2013-05-30 DIAGNOSIS — G4733 Obstructive sleep apnea (adult) (pediatric): Secondary | ICD-10-CM | POA: Diagnosis not present

## 2013-05-31 DIAGNOSIS — G4733 Obstructive sleep apnea (adult) (pediatric): Secondary | ICD-10-CM | POA: Diagnosis not present

## 2013-05-31 DIAGNOSIS — G4710 Hypersomnia, unspecified: Secondary | ICD-10-CM | POA: Diagnosis not present

## 2013-05-31 DIAGNOSIS — E669 Obesity, unspecified: Secondary | ICD-10-CM | POA: Diagnosis not present

## 2013-05-31 DIAGNOSIS — Z6841 Body Mass Index (BMI) 40.0 and over, adult: Secondary | ICD-10-CM | POA: Diagnosis not present

## 2013-07-17 ENCOUNTER — Other Ambulatory Visit: Payer: Self-pay | Admitting: Family Medicine

## 2013-07-22 ENCOUNTER — Other Ambulatory Visit: Payer: Self-pay | Admitting: Family Medicine

## 2013-07-29 ENCOUNTER — Encounter: Payer: Self-pay | Admitting: Family Medicine

## 2013-07-29 ENCOUNTER — Ambulatory Visit (INDEPENDENT_AMBULATORY_CARE_PROVIDER_SITE_OTHER): Payer: Medicare Other | Admitting: Family Medicine

## 2013-07-29 VITALS — BP 141/93 | HR 82 | Temp 98.1°F | Wt 267.0 lb

## 2013-07-29 DIAGNOSIS — I10 Essential (primary) hypertension: Secondary | ICD-10-CM | POA: Diagnosis not present

## 2013-07-29 DIAGNOSIS — IMO0001 Reserved for inherently not codable concepts without codable children: Secondary | ICD-10-CM | POA: Diagnosis not present

## 2013-07-29 DIAGNOSIS — IMO0002 Reserved for concepts with insufficient information to code with codable children: Secondary | ICD-10-CM

## 2013-07-29 DIAGNOSIS — R5383 Other fatigue: Secondary | ICD-10-CM | POA: Diagnosis not present

## 2013-07-29 DIAGNOSIS — E1165 Type 2 diabetes mellitus with hyperglycemia: Secondary | ICD-10-CM

## 2013-07-29 DIAGNOSIS — R5381 Other malaise: Secondary | ICD-10-CM | POA: Diagnosis not present

## 2013-07-29 LAB — LDL CHOLESTEROL, DIRECT: LDL DIRECT: 54 mg/dL

## 2013-07-29 LAB — BASIC METABOLIC PANEL
BUN: 18 mg/dL (ref 6–23)
CALCIUM: 9.1 mg/dL (ref 8.4–10.5)
CO2: 26 meq/L (ref 19–32)
Chloride: 104 mEq/L (ref 96–112)
Creat: 0.86 mg/dL (ref 0.50–1.10)
Glucose, Bld: 84 mg/dL (ref 70–99)
Potassium: 3.3 mEq/L — ABNORMAL LOW (ref 3.5–5.3)
SODIUM: 143 meq/L (ref 135–145)

## 2013-07-29 LAB — TSH: TSH: 2.261 u[IU]/mL (ref 0.350–4.500)

## 2013-07-29 LAB — POCT GLYCOSYLATED HEMOGLOBIN (HGB A1C): HEMOGLOBIN A1C: 6.9

## 2013-07-29 NOTE — Patient Instructions (Signed)
Please stop your Lantus for now and see how you feel. If your blood sugar goes OVER 200, please restart the Lantus at a low dose to keep your blood sugars between 110-150. Drink plenty of fluids. We will check labs today, and we will let you know how everything looks.  Please let us know if you still are not feeling well in 1-2 weeks. Follow up with your new doctor in 6-8 weeks.  Davonda Ausley M. Kyla Duffy, M.D.

## 2013-07-29 NOTE — Assessment & Plan Note (Signed)
A: A1C 6.9 today. Patient is on 4 medications, concerned about fatigue. I wonder if she is taking too much insulin and she is dropping her blood sugar too low which is contributing to her symptoms.  P: - Stop Lantus. If blood sugar goes over 200, she can restart at a low dose - Con't VIctoza, Amaryl and Metformin - Call if not feeling better in 1-2 weeks - f/u in 6-8 weeks

## 2013-07-29 NOTE — Assessment & Plan Note (Signed)
A: Slightly above goal today. NO complaints  P: - Con't current regimen. - if still elevated at next visit, consider adjusting doses - Bmet today

## 2013-07-29 NOTE — Progress Notes (Signed)
Patient ID: Kathryn Crosby, female   DOB: [birth year], 53 y.o.   MRN: [id number]    Subjective: HPI: Patient is a 53 y.o. female presenting to clinic today for follow up. Concerns today include fatigue  1. Diabetes:  High at home: 125 Low at home: 24 Taking medications: Lantus 40, Victoza, Amaryl, Metformin Side effects: Fatigue, dizziness (see below) ROS: denies fever, chills, LOC, polyuria, polydipsia, numbness or tingling in extremities or chest pain. Last eye exam: 4 months ago, Dr. Katy Fitch Last foot exam: Podiatry within one year Nephropathy screen indicated?: No, on ACE  2. Hypertension Blood pressure at home: Labile, highest 150/90. Usually 140/80's Blood pressure today: 141/93 Taking Meds: Taking medications Side effects: No side effects ROS: Denies headache, visual changes, nausea, vomiting, chest pain, abdominal pain or shortness of breath.  3. Fatigue 2 weeks of fatigue with associated dizziness. Staying well hydrated. Stays at home. No pre-syncope, syncope or falls. No numbness or tingling.   History Reviewed: Never smoker. Health Maintenance: Need Tdap  ROS: Please see HPI above.  Objective: Office vital signs reviewed. BP 141/93  Pulse 82  Temp(Src) 98.1 F (36.7 C) (Oral)  Wt 267 lb (121.11 kg)  Physical Examination:  General: Awake, alert. NAD, appears fatigued HEENT: Atraumatic, normocephalic Neck: No masses palpated. No LAD Pulm: CTAB, no wheezes Cardio: RRR, no murmurs appreciated Abdomen:obese, soft, nontender, nondistended Extremities: 1+ edema to mid-shin Neuro: Strength and sensation rossly intact  Assessment: 53 y.o. female follow up  Plan: See Problem List and After Visit Summary

## 2013-07-29 NOTE — Assessment & Plan Note (Signed)
A: DDx includes too much insulin/hypoglycemia, thyroid disorder, electrolyte disorder, AKI, dehydration. No red flags on history or exam.  P: - Bmet today - TSH today - Decrease insulin - F/u in 6-8 weeks, or sooner if needed

## 2013-07-30 ENCOUNTER — Encounter: Payer: Self-pay | Admitting: Family Medicine

## 2013-08-16 ENCOUNTER — Other Ambulatory Visit: Payer: Self-pay | Admitting: Family Medicine

## 2013-08-16 NOTE — Telephone Encounter (Signed)
Pt is aware and states that it is not bad now.  Usually has them on hand since she is diabetic.  She has a gyn appt coming up in august.  She will call for a same day appt if she needs them before then. Toinette Lackie,CMA

## 2013-08-16 NOTE — Telephone Encounter (Signed)
Please inform the patient that she needs an appointment to be evaluated prior to having diflucan refilled. Thanks.

## 2013-08-26 ENCOUNTER — Other Ambulatory Visit: Payer: Self-pay | Admitting: Family Medicine

## 2013-08-28 ENCOUNTER — Ambulatory Visit: Payer: Medicare Other | Admitting: Obstetrics

## 2013-08-28 DIAGNOSIS — N289 Disorder of kidney and ureter, unspecified: Secondary | ICD-10-CM | POA: Diagnosis not present

## 2013-08-28 DIAGNOSIS — N281 Cyst of kidney, acquired: Secondary | ICD-10-CM | POA: Diagnosis not present

## 2013-08-28 DIAGNOSIS — N200 Calculus of kidney: Secondary | ICD-10-CM | POA: Diagnosis not present

## 2013-09-02 ENCOUNTER — Ambulatory Visit: Payer: Medicare Other | Admitting: Obstetrics

## 2013-09-08 ENCOUNTER — Other Ambulatory Visit: Payer: Self-pay | Admitting: Family Medicine

## 2013-09-09 ENCOUNTER — Encounter: Payer: Self-pay | Admitting: Obstetrics

## 2013-09-09 ENCOUNTER — Ambulatory Visit (INDEPENDENT_AMBULATORY_CARE_PROVIDER_SITE_OTHER): Payer: Medicare Other | Admitting: Obstetrics

## 2013-09-09 VITALS — BP 167/102 | HR 63 | Temp 97.8°F | Ht 68.0 in | Wt 278.0 lb

## 2013-09-09 DIAGNOSIS — N9089 Other specified noninflammatory disorders of vulva and perineum: Secondary | ICD-10-CM | POA: Diagnosis not present

## 2013-09-09 DIAGNOSIS — Z01419 Encounter for gynecological examination (general) (routine) without abnormal findings: Secondary | ICD-10-CM

## 2013-09-09 DIAGNOSIS — B3731 Acute candidiasis of vulva and vagina: Secondary | ICD-10-CM | POA: Diagnosis not present

## 2013-09-09 DIAGNOSIS — B373 Candidiasis of vulva and vagina: Secondary | ICD-10-CM

## 2013-09-09 DIAGNOSIS — N760 Acute vaginitis: Secondary | ICD-10-CM | POA: Diagnosis not present

## 2013-09-09 MED ORDER — FLUCONAZOLE 150 MG PO TABS: 150.0000 mg | ORAL_TABLET | Freq: Once | ORAL | Status: DC

## 2013-09-09 NOTE — Progress Notes (Signed)
Subjective:     Kathryn Crosby is a 53 y.o. female here for a routine exam.  Current complaints:  Growth on vulva.    Personal health questionnaire:  Is patient Ashkenazi Jewish, have a family history of breast and/or ovarian cancer: no Is there a family history of uterine cancer diagnosed at age < 49, gastrointestinal cancer, urinary tract cancer, family member who is a Field seismologist syndrome-associated carrier: no Is the patient overweight and hypertensive, family history of diabetes, personal history of gestational diabetes or PCOS: no Is patient over 62, have PCOS,  family history of premature CHD under age 78, diabetes, smoke, have hypertension or peripheral artery disease:  no At any time, has a partner hit, kicked or otherwise hurt or frightened you?: no Over the past 2 weeks, have you felt down, depressed or hopeless?: no Over the past 2 weeks, have you felt little interest or pleasure in doing things?:no   Gynecologic History No LMP recorded. Patient has had a hysterectomy. Contraception: status post hysterectomy Last Pap: unknown. Results were: normal Last mammogram: 2015. Results were: normal  Obstetric History OB History  Gravida Para Term Preterm AB SAB TAB Ectopic Multiple Living  _0 # Outcome Date GA Lbr Len/2nd Weight Sex Delivery Anes PTL Lv  3 TRM 09/16/81   8 lb (3.629 kg) F SVD EPI  Y  2 TRM 11/29/77   7 lb 6 oz (3.345 kg) M SVD EPI  Y  1 TRM 02/29/76   7 lb 9 oz (3.43 kg) M SVD EPI  Y      Past Medical History  Diagnosis Date  . Diabetes mellitus   . Hypertension   . Hyperlipidemia   . Nephrolithiasis   . GERD (gastroesophageal reflux disease)   . Migraine   . Kidney stones   . Chronic headaches     Past Surgical History  Procedure Laterality Date  . Abdominal hysterectomy    . Tonsillectomy    . Left ankle fracture    . Lithotripsy    . Sleep apnea      Current outpatient prescriptions:ACCU-CHEK AVIVA PLUS test strip, USE ONE STRIP TO  CHECK BLOOD SUGAR ONCE DAILY AND AS NEEDED FOR SYMPTOMS, Disp: 100 each, Rfl: prn;  ACCU-CHEK FASTCLIX LANCETS MISC, 1 each by Other route 2 (two) times daily. Use one lancet to test blood sugar twice a day. DX: 250.02, Disp: 102 each, Rfl: 11;  amLODipine (NORVASC) 5 MG tablet, Take 1 tablet (5 mg total) by mouth daily., Disp: 90 tablet, Rfl: 3 Blood Glucose Monitoring Suppl (ACCU-CHEK NANO SMARTVIEW) W/DEVICE KIT, 1 Device by Does not apply route once. Patient has coupon for free meter. Please wait for processing, Disp: 1 kit, Rfl: 0;  glimepiride (AMARYL) 2 MG tablet, TAKE TWO TABLETS BY MOUTH ONCE DAILY BEFORE BREAKFAST, Disp: 60 tablet, Rfl: 0;  hydrochlorothiazide (HYDRODIURIL) 25 MG tablet, TAKE ONE TABLET BY MOUTH EVERY DAY, Disp: 30 tablet, Rfl: 5 Insulin Pen Needle (NOVOFINE) 30G X 8 MM MISC, Inject 10 each into the skin as needed. QS for bid dosing of byetta, Disp: 1 packet, Rfl: 11;  LANTUS 100 UNIT/ML injection, INJECT 40 UNITS SUBCUTANEOUSLY EVERY DAY, Disp: 10 vial, Rfl: prn;  Liraglutide (VICTOZA) 18 MG/3ML SOPN, Inject 1.2 mg into the skin daily., Disp: 12 mL, Rfl: 5;  lisinopril (PRINIVIL,ZESTRIL) 40 MG tablet, TAKE ONE TABLET BY MOUTH EVERY DAY, Disp: 90 tablet, Rfl: 2 losartan (COZAAR) 50 MG  tablet, TAKE ONE TABLET BY MOUTH EVERY DAY, Disp: 90 tablet, Rfl: 1;  metFORMIN (GLUCOPHAGE) 1000 MG tablet, TAKE ONE TABLET BY MOUTH TWICE DAILY WITH  A  MEAL, Disp: 60 tablet, Rfl: 5;  metoprolol (TOPROL-XL) 200 MG 24 hr tablet, TAKE ONE TABLET BY MOUTH ONCE DAILY, Disp: 100 tablet, Rfl: 5;  omeprazole (PRILOSEC) 40 MG capsule, TAKE ONE CAPSULE BY MOUTH ONCE DAILY, Disp: 90 capsule, Rfl: 5 potassium citrate (UROCIT-K) 10 MEQ (1080 MG) SR tablet, Take 20 mEq by mouth 2 (two) times daily with a meal., Disp: , Rfl: ;  RELION INSULIN SYRINGE 1ML/31G 31G X 5/16" 1 ML MISC, USE ONE SYRINGE ONE DAILY, Disp: 100 each, Rfl: 11;  simvastatin (ZOCOR) 40 MG tablet, TAKE ONE TABLET BY MOUTH IN THE EVENING, Disp: 30  tablet, Rfl: 5;  ULTICARE MINI PEN NEEDLES 31G X 6 MM MISC, USE TWICE DAILY AS NEEDED FOR DOSING OF BYETTA., Disp: 50 each, Rfl: 0 fluconazole (DIFLUCAN) 150 MG tablet, Take 1 tablet (150 mg total) by mouth once., Disp: 1 tablet, Rfl: prn Current facility-administered medications:triamcinolone acetonide (KENALOG) 10 MG/ML injection 10 mg, 10 mg, Intra-articular, Once, Amber Fidel Levy, MD Allergies  Allergen Reactions  . Aspirin Hives  . Ivp Dye [Iodinated Diagnostic Agents] Hives  . Shellfish-Derived Products Hives and Shortness Of Breath    History  Substance Use Topics  . Smoking status: Never Smoker   . Smokeless tobacco: Never Used  . Alcohol Use: No    Family History  Problem Relation Age of Onset  . Coronary artery disease Brother     CABG age 82  . Diabetes Mother   . Hypertension Mother   . Kidney disease Mother       Review of Systems  Constitutional: negative for fatigue and weight loss Respiratory: negative for cough and wheezing Cardiovascular: negative for chest pain, fatigue and palpitations Gastrointestinal: negative for abdominal pain and change in bowel habits Musculoskeletal:negative for myalgias Neurological: negative for gait problems and tremors Behavioral/Psych: negative for abusive relationship, depression Endocrine: negative for temperature intolerance   Genitourinary:negative for abnormal menstrual periods, genital lesions, hot flashes, sexual problems and vaginal discharge Integument/breast: negative for breast lump, breast tenderness, nipple discharge and skin lesion(s)    Objective:       BP 167/102  Pulse 63  Temp(Src) 97.8 F (36.6 C)  Ht _0  (1.727 m)  Wt 278 lb (126.1 kg)  BMI 42.28 kg/m2 General:   alert  Skin:   no rash or abnormalities  Lungs:   clear to auscultation bilaterally  Heart:   regular rate and rhythm, S1, S2 normal, no murmur, click, rub or gallop  Breasts:   normal without suspicious masses, skin or nipple changes  or axillary nodes  Abdomen:  normal findings: no organomegaly, soft, non-tender and no hernia  Pelvis:  External genitalia: normal general appearance Urinary system: urethral meatus normal and bladder without fullness, nontender Vaginal: normal without tenderness, induration or masses Cervix: absent Adnexa: not felt Uterus: absent   Lab Review Urine pregnancy test Labs reviewed yes Radiologic studies reviewed no    Assessment:    Healthy female exam.   Hypertension  Chronic renal calculi   Plan:    Education reviewed: low fat, low cholesterol diet and self breast exams. Follow up in: 2 years.   Meds ordered this encounter  Medications  . fluconazole (DIFLUCAN) 150 MG tablet    Sig: Take 1 tablet (150 mg total) by mouth once.    Dispense:  1 tablet    Refill:  prn   Orders Placed This Encounter  Procedures  . WET PREP BY MOLECULAR PROBE

## 2013-09-10 LAB — WET PREP BY MOLECULAR PROBE
Candida species: POSITIVE — AB
Gardnerella vaginalis: NEGATIVE
Trichomonas vaginosis: NEGATIVE

## 2013-09-12 NOTE — Telephone Encounter (Signed)
Needs refills on  Simvastatin and hydrochlorothiazide [facility] on [address]

## 2013-09-16 ENCOUNTER — Emergency Department (HOSPITAL_COMMUNITY)
Admission: EM | Admit: 2013-09-16 | Discharge: 2013-09-16 | Disposition: A | Discharge status: Home / Self Care | Payer: Medicare Other | Source: Home / Self Care

## 2013-09-16 ENCOUNTER — Other Ambulatory Visit: Payer: Self-pay | Admitting: *Deleted

## 2013-09-16 ENCOUNTER — Ambulatory Visit (INDEPENDENT_AMBULATORY_CARE_PROVIDER_SITE_OTHER): Payer: Medicare Other | Admitting: Family Medicine

## 2013-09-16 VITALS — BP 147/91 | HR 80 | Temp 98.2°F | Ht 68.0 in | Wt 276.0 lb

## 2013-09-16 DIAGNOSIS — R609 Edema, unspecified: Secondary | ICD-10-CM | POA: Diagnosis not present

## 2013-09-16 DIAGNOSIS — G43809 Other migraine, not intractable, without status migrainosus: Secondary | ICD-10-CM

## 2013-09-16 DIAGNOSIS — R21 Rash and other nonspecific skin eruption: Secondary | ICD-10-CM | POA: Diagnosis not present

## 2013-09-16 MED ORDER — SUMATRIPTAN SUCCINATE 50 MG PO TABS: 50.0000 mg | ORAL_TABLET | Freq: Once | ORAL | Status: DC

## 2013-09-16 NOTE — Progress Notes (Signed)
   Subjective:    Patient ID: Kathryn Crosby, female    DOB: [birth year], 53 y.o.   MRN: [id number]  HPI 53 year old female presents for a same day appointment with complaints of rash, headache, and LE edema.  1) Rash - Began Saturday. She feels like she may have been bitten by a bug/insect given appearance of areas, but she does not recall any bites. - She has 3 areas of concern - right forearm, right tricep region, and left tricep region.   - Areas are red and intensely itching. - She has been using rubbing alcohol to "dry up" the areas with little relief.  - She denies any drainage, fever, chills.  No recollection of tick bite or bug bite.  No recent travel or outdoor activity.  2) Headache - Onset: Saturday  - Location: Bilateral temporal region.  - Quality: Throbbing - Frequency: Persistent since saturday  - Precipitating factors: None  - Prior treatment: Tylenol - Associated phonophobia/photophobia  3) LE edema  - This is chronic issue for her. - Has been recently worsening with associated increasing pain - She has been elevating her legs and applying compression stockings with little improvement. - No reports of chest pain or associated SOB.  - Has had a recent echo - 03/2013. Normal EF; grade 1 diastolic dysfunction.  Review of Systems Per HPI    Objective:   Physical Exam Filed Vitals:   09/16/13 1056  BP: 147/91  Pulse: 80  Temp: 98.2 F (36.8 C)   Exam: General: well appearing female in NAD. HEENT: NCAT.  EOMI. Neck: No cervical/paraspinous muscle tenderness noted. Full ROM. Cardiovascular: RRR. No murmurs, rubs, or gallops. Respiratory: CTAB. No rales, rhonchi, or wheeze. Abdomen: soft, nontender, nondistended. Extremities: 2-3+ pitting LE edema which extends to mid calf/shin' Skin: Right forearm - large area with central erythema with mild induration, surrounded by area of clearing, with outer rim of erythema (target lesion).  No drainage noted.  No  fluctuance.  Right & Left triceps - small erythematous areas with induration noted. No drainage or fluctuance. Neuro: No focal deficits.     Assessment & Plan:  See Problem List

## 2013-09-16 NOTE — Patient Instructions (Addendum)
It was nice to see you today.  Below is what we have discussed today.  1) Rash - These appear as some sort of bite. - There is no evidence of infection at this time - Use benadryl for the itching. - If it worsens please return.  2) Headache - This is likely secondary to migraine - I have sent in an Rx for migraine.  3) Swelling - Continue compression and elevation. - Try and exercise daily as well. - Follow up closely with your PCP.

## 2013-09-16 NOTE — Telephone Encounter (Signed)
The requested medication, zofran, appears to have been removed from the patients medication list at this time and I do not see an indication for this medication on the patients problems list. Could you contact the patient to find out why she was taking this and inform her that she will need to be seen for a refill on this medication. Thanks.

## 2013-09-17 DIAGNOSIS — R21 Rash and other nonspecific skin eruption: Secondary | ICD-10-CM | POA: Insufficient documentation

## 2013-09-17 NOTE — Assessment & Plan Note (Signed)
Unclear etiology but appears to be secondary to bug/insect bite. No evidence of abscess at this time. Advised PRN Benadryl and close follow up if it fails to improve.

## 2013-09-17 NOTE — Telephone Encounter (Signed)
Pt called and informed of message below from Dr. Caryl Bis. Pt stated she did not need Zofran.  Derl Barrow, RN

## 2013-09-17 NOTE — Assessment & Plan Note (Signed)
Chronic. Appears to be secondary to venous stasis.  Recent echo with normal EF, grade 1 diastolic dysfunction. Appears stable on exam today with no SOB or chest discomfort. I advised continued use of compression stockings and elevation.  I also advised exercise and weight loss as this is contributing.

## 2013-09-17 NOTE — Assessment & Plan Note (Signed)
Headache consistent with migraine.  Will treat with imitrex.

## 2013-09-28 ENCOUNTER — Emergency Department (HOSPITAL_COMMUNITY): Payer: Medicare Other

## 2013-09-28 ENCOUNTER — Emergency Department (HOSPITAL_COMMUNITY)
Admission: EM | Admit: 2013-09-28 | Discharge: 2013-09-28 | Disposition: A | Discharge status: Home / Self Care | Payer: Medicare Other | Attending: Emergency Medicine | Admitting: Emergency Medicine

## 2013-09-28 ENCOUNTER — Encounter (HOSPITAL_COMMUNITY): Payer: Self-pay | Admitting: Emergency Medicine

## 2013-09-28 DIAGNOSIS — Z8669 Personal history of other diseases of the nervous system and sense organs: Secondary | ICD-10-CM | POA: Diagnosis not present

## 2013-09-28 DIAGNOSIS — Z794 Long term (current) use of insulin: Secondary | ICD-10-CM | POA: Diagnosis not present

## 2013-09-28 DIAGNOSIS — E119 Type 2 diabetes mellitus without complications: Secondary | ICD-10-CM | POA: Diagnosis not present

## 2013-09-28 DIAGNOSIS — G8929 Other chronic pain: Secondary | ICD-10-CM | POA: Diagnosis not present

## 2013-09-28 DIAGNOSIS — Z90710 Acquired absence of both cervix and uterus: Secondary | ICD-10-CM | POA: Insufficient documentation

## 2013-09-28 DIAGNOSIS — Z79899 Other long term (current) drug therapy: Secondary | ICD-10-CM | POA: Diagnosis not present

## 2013-09-28 DIAGNOSIS — E785 Hyperlipidemia, unspecified: Secondary | ICD-10-CM | POA: Diagnosis not present

## 2013-09-28 DIAGNOSIS — I10 Essential (primary) hypertension: Secondary | ICD-10-CM | POA: Insufficient documentation

## 2013-09-28 DIAGNOSIS — R109 Unspecified abdominal pain: Secondary | ICD-10-CM | POA: Diagnosis not present

## 2013-09-28 DIAGNOSIS — M549 Dorsalgia, unspecified: Secondary | ICD-10-CM | POA: Insufficient documentation

## 2013-09-28 DIAGNOSIS — N390 Urinary tract infection, site not specified: Secondary | ICD-10-CM

## 2013-09-28 DIAGNOSIS — R110 Nausea: Secondary | ICD-10-CM | POA: Diagnosis not present

## 2013-09-28 DIAGNOSIS — G43909 Migraine, unspecified, not intractable, without status migrainosus: Secondary | ICD-10-CM | POA: Diagnosis not present

## 2013-09-28 DIAGNOSIS — K219 Gastro-esophageal reflux disease without esophagitis: Secondary | ICD-10-CM | POA: Diagnosis not present

## 2013-09-28 DIAGNOSIS — N200 Calculus of kidney: Secondary | ICD-10-CM | POA: Diagnosis not present

## 2013-09-28 LAB — CBC
HCT: 39.7 % (ref 36.0–46.0)
Hemoglobin: 13.2 g/dL (ref 12.0–15.0)
MCH: 26.6 pg (ref 26.0–34.0)
MCHC: 33.2 g/dL (ref 30.0–36.0)
MCV: 80 fL (ref 78.0–100.0)
PLATELETS: 392 10*3/uL (ref 150–400)
RBC: 4.96 MIL/uL (ref 3.87–5.11)
RDW: 15.2 % (ref 11.5–15.5)
WBC: 10.8 10*3/uL — AB (ref 4.0–10.5)

## 2013-09-28 LAB — URINE MICROSCOPIC-ADD ON

## 2013-09-28 LAB — BASIC METABOLIC PANEL
ANION GAP: 13 (ref 5–15)
BUN: 15 mg/dL (ref 6–23)
CO2: 27 mEq/L (ref 19–32)
Calcium: 8.9 mg/dL (ref 8.4–10.5)
Chloride: 100 mEq/L (ref 96–112)
Creatinine, Ser: 0.81 mg/dL (ref 0.50–1.10)
GFR calc Af Amer: >90 mL/min (ref >90)
GFR, EST NON AFRICAN AMERICAN: 80 mL/min — AB (ref >90)
Glucose, Bld: 69 mg/dL — ABNORMAL LOW (ref 70–99)
POTASSIUM: 3.4 meq/L — AB (ref 3.7–5.3)
Sodium: 140 mEq/L (ref 137–147)

## 2013-09-28 LAB — URINALYSIS, ROUTINE W REFLEX MICROSCOPIC
Bilirubin Urine: NEGATIVE
GLUCOSE, UA: NEGATIVE mg/dL
Hgb urine dipstick: NEGATIVE
Ketones, ur: NEGATIVE mg/dL
Nitrite: NEGATIVE
Protein, ur: NEGATIVE mg/dL
SPECIFIC GRAVITY, URINE: 1.014 (ref 1.005–1.030)
Urobilinogen, UA: 1 mg/dL (ref 0.0–1.0)
pH: 7.5 (ref 5.0–8.0)

## 2013-09-28 MED ORDER — OXYCODONE-ACETAMINOPHEN 5-325 MG PO TABS: 2.0000 | ORAL_TABLET | Freq: Four times a day (QID) | ORAL | Status: DC | PRN

## 2013-09-28 MED ORDER — MORPHINE SULFATE 4 MG/ML IJ SOLN
4.0000 mg | Freq: Once | INTRAMUSCULAR | Status: AC
  Administered 2013-09-28: 4 mg via INTRAVENOUS
  Filled 2013-09-28: qty 1

## 2013-09-28 MED ORDER — SULFAMETHOXAZOLE-TMP DS 800-160 MG PO TABS: 1.0000 | ORAL_TABLET | Freq: Two times a day (BID) | ORAL | Status: DC

## 2013-09-28 MED ORDER — CEPHALEXIN 500 MG PO CAPS: 500.0000 mg | ORAL_CAPSULE | Freq: Three times a day (TID) | ORAL | Status: DC

## 2013-09-28 MED ORDER — HYDROCODONE-ACETAMINOPHEN 5-325 MG PO TABS: 1.0000 | ORAL_TABLET | Freq: Four times a day (QID) | ORAL | Status: DC | PRN

## 2013-09-28 MED ORDER — ONDANSETRON HCL 4 MG/2ML IJ SOLN
4.0000 mg | Freq: Once | INTRAMUSCULAR | Status: AC
  Administered 2013-09-28: 4 mg via INTRAVENOUS
  Filled 2013-09-28: qty 2

## 2013-09-28 NOTE — Discharge Instructions (Signed)

## 2013-09-28 NOTE — ED Provider Notes (Signed)
CSN: [id number]     Arrival date & time 09/28/13  1720 History   First MD Initiated Contact with Patient 09/28/13 1859     Chief Complaint  Patient presents with  . Abdominal Pain  . Back Pain  . Flank Pain     (Consider location/radiation/quality/duration/timing/severity/associated sxs/prior Treatment) Patient is a 53 y.o. female presenting with abdominal pain, back pain, and flank pain. The history is provided by the patient.  Abdominal Pain Pain location:  L flank Pain quality: aching   Pain quality: no fullness, not gnawing and not heavy   Pain radiates to:  Does not radiate Pain severity:  Moderate Onset quality:  Gradual Duration:  1 week Timing:  Constant Progression:  Worsening Chronicity:  Recurrent Context: not diet changes, not eating and not sick contacts   Relieved by:  Nothing Worsened by:  Nothing tried Associated symptoms: nausea   Associated symptoms: no cough, no fever, no shortness of breath and no vomiting   Back Pain Associated symptoms: abdominal pain   Associated symptoms: no fever   Flank Pain Associated symptoms include abdominal pain. Pertinent negatives include no shortness of breath.    Past Medical History  Diagnosis Date  . Diabetes mellitus   . Hypertension   . Hyperlipidemia   . Nephrolithiasis   . GERD (gastroesophageal reflux disease)   . Migraine   . Kidney stones   . Chronic headaches    Past Surgical History  Procedure Laterality Date  . Abdominal hysterectomy    . Tonsillectomy    . Left ankle fracture    . Lithotripsy    . Sleep apnea     Family History  Problem Relation Age of Onset  . Coronary artery disease Brother     CABG age 79  . Diabetes Mother   . Hypertension Mother   . Kidney disease Mother    History  Substance Use Topics  . Smoking status: Never Smoker   . Smokeless tobacco: Never Used  . Alcohol Use: No   OB History   Grav Para Term Preterm Abortions TAB SAB Ect Mult Living   _0 Review of Systems  Constitutional: Negative for fever.  Respiratory: Negative for cough and shortness of breath.   Gastrointestinal: Positive for nausea and abdominal pain. Negative for vomiting.  Genitourinary: Positive for flank pain.  Musculoskeletal: Positive for back pain.  All other systems reviewed and are negative.     Allergies  Aspirin; Ivp dye; and Shellfish-derived products  Home Medications   Prior to Admission medications   Medication Sig Start Date End Date Taking? Authorizing Provider  ACCU-CHEK AVIVA PLUS test strip USE ONE STRIP TO CHECK BLOOD SUGAR ONCE DAILY AND AS NEEDED FOR SYMPTOMS 07/22/13   Montez Morita, MD  ACCU-CHEK FASTCLIX LANCETS MISC 1 each by Other route 2 (two) times daily. Use one lancet to test blood sugar twice a day. DX: 250.02 04/05/13   Amber Fidel Levy, MD  amLODipine (NORVASC) 5 MG tablet Take 1 tablet (5 mg total) by mouth daily. 03/29/13 03/29/14  Montez Morita, MD  Blood Glucose Monitoring Suppl (ACCU-CHEK NANO SMARTVIEW) W/DEVICE KIT 1 Device by Does not apply route once. Patient has coupon for free meter. Please wait for processing 06/28/11   Zigmund Gottron, MD  fluconazole (DIFLUCAN) 150 MG tablet Take 1 tablet (150 mg total) by mouth once. 09/09/13   Shelly Bombard, MD  glimepiride (AMARYL) 2 MG tablet TAKE TWO TABLETS BY MOUTH ONCE DAILY BEFORE BREAKFAST 08/26/13   Leone Haven, MD  hydrochlorothiazide (HYDRODIURIL) 25 MG tablet TAKE ONE TABLET BY MOUTH ONCE DAILY 09/14/13   Leone Haven, MD  Insulin Pen Needle (NOVOFINE) 30G X 8 MM MISC Inject 10 each into the skin as needed. QS for bid dosing of byetta 09/08/11   Zigmund Gottron, MD  LANTUS 100 UNIT/ML injection INJECT 40 UNITS SUBCUTANEOUSLY EVERY DAY 07/17/13   Amber Fidel Levy, MD  Liraglutide (VICTOZA) 18 MG/3ML SOPN Inject 1.2 mg into the skin daily. 10/19/12   Amber Fidel Levy, MD  lisinopril (PRINIVIL,ZESTRIL) 40 MG tablet TAKE ONE TABLET BY MOUTH EVERY DAY  02/14/13   Marin Olp, MD  losartan (COZAAR) 50 MG tablet TAKE ONE TABLET BY MOUTH EVERY DAY 02/14/13   Marin Olp, MD  metFORMIN (GLUCOPHAGE) 1000 MG tablet TAKE ONE TABLET BY MOUTH TWICE DAILY WITH  A  MEAL 02/14/13   Marin Olp, MD  metoprolol (TOPROL-XL) 200 MG 24 hr tablet TAKE ONE TABLET BY MOUTH ONCE DAILY 10/19/12   Montez Morita, MD  omeprazole (PRILOSEC) 40 MG capsule TAKE ONE CAPSULE BY MOUTH ONCE DAILY    Amber Fidel Levy, MD  potassium citrate (UROCIT-K) 10 MEQ (1080 MG) SR tablet Take 20 mEq by mouth 2 (two) times daily with a meal.    Historical Provider, MD  RELION INSULIN SYRINGE 1ML/31G 31G X 5/16" 1 ML MISC USE ONE SYRINGE ONE DAILY 12/21/12   Montez Morita, MD  simvastatin (ZOCOR) 40 MG tablet TAKE ONE TABLET BY MOUTH IN THE EVENING 09/14/13   Leone Haven, MD  SUMAtriptan (IMITREX) 50 MG tablet Take 1 tablet (50 mg total) by mouth once. May repeat in 2 hours if headache persists or recurs. 09/16/13   Coral Spikes, DO  ULTICARE MINI PEN NEEDLES 31G X 6 MM MISC USE TWICE DAILY AS NEEDED FOR DOSING OF BYETTA. 10/12/12   Montez Morita, MD   BP 147/96  Pulse 79  Temp(Src) 97.8 F (36.6 C) (Oral)  Resp 18  Ht _0  (1.727 m)  Wt 284 lb (128.822 kg)  BMI 43.19 kg/m2  SpO2 98% Physical Exam  Nursing note and vitals reviewed. Constitutional: She is oriented to person, place, and time. She appears well-developed and well-nourished. No distress.  HENT:  Head: Normocephalic and atraumatic.  Mouth/Throat: Oropharynx is clear and moist.  Eyes: EOM are normal. Pupils are equal, round, and reactive to light.  Neck: Normal range of motion. Neck supple.  Cardiovascular: Normal rate and regular rhythm.  Exam reveals no friction rub.   No murmur heard. Pulmonary/Chest: Effort normal and breath sounds normal. No respiratory distress. She has no wheezes. She has no rales.  Abdominal: Soft. She exhibits no distension. There is tenderness (L flank, L abdomen).  There is no rebound.  Musculoskeletal: Normal range of motion. She exhibits no edema.  Neurological: She is alert and oriented to person, place, and time.  Skin: She is not diaphoretic.    ED Course  Procedures (including critical care time) Labs Review Labs Reviewed  URINALYSIS, ROUTINE W REFLEX MICROSCOPIC - Abnormal; Notable for the following:    APPearance CLOUDY (*)    Leukocytes, UA TRACE (*)    All other components within normal limits  URINE MICROSCOPIC-ADD ON - Abnormal; Notable for the following:    Squamous Epithelial / LPF FEW (*)    Bacteria, UA MANY (*)  All other components within normal limits  CBC  BASIC METABOLIC PANEL    Imaging Review Ct Renal Stone Study  09/28/2013   CLINICAL DATA:  Kidneys dens. Status post lithotripsy. New onset of left flank pain beginning last night. Nausea and increased urinary frequency.  EXAM: CT RENAL STONE PROTOCOL  TECHNIQUE: Multidetector CT imaging of the abdomen and pelvis was performed following the standard protocol without intravenous contrast  COMPARISON:  Renal ultrasound 07/27/2011. CT abdomen pelvis 09/23/2009.  FINDINGS: Lung bases are clear without focal nodule, mass, or airspace disease. The heart size is normal. No significant pleural or pericardial effusion is present.  Punctate calcification within the liver may be related to prior granulomatous disease and is stable. The liver is otherwise unremarkable. The spleen is within normal limits. The stomach, duodenum, and pancreas are within normal limits. The common bile duct and gallbladder are normal. The adrenal glands are normal bilaterally.  Multiple punctate stones are again seen in both kidneys. There is no evidence for obstruction. No ureteral stones are present. No significant inflammatory changes are present about the either E ureter. Urinary bladder is within normal limits.  The rectosigmoid colon is within normal limits. The remainder of the colon is unremarkable. The  appendix is visualized and normal. Small bowel is within normal limits. Scattered sub cm lymph nodes are present within the small bowel mesenteric. Atherosclerotic calcifications are present in the aorta and branch vessels without aneurysm. No significant adenopathy or free fluid is present. Patient is status post hysterectomy. Left ovary is visualized and within normal limits for age. The right ovary is not discretely visualized and may be surgically absent.  Bone windows demonstrate degenerative changes at L5-S1.  IMPRESSION: 1. Bilateral nonobstructing nephrolithiasis. 2. No evidence for ureteral obstruction or inflammation. 3. Sub cm lymph nodes within the small bowel mesenteries. These are nonspecific, but could be related to enteritis. 4. Degenerative changes of the lumbar spine. 5. Status post hysterectomy.   Electronically Signed   By: Lawrence Santiago M.D.   On: 09/28/2013 20:14     EKG Interpretation None      MDM   Final diagnoses:  Left flank pain  UTI (lower urinary tract infection)    11F with hx of kidney stones (numerous) here with L flank pain, similar to prior stones. Mild urinary hesitancy also. No fevers, but is having some nausea. AFVSS here. L flank pain on exam. Will CT to look for stones. CT negative for obstructing stones. Patient's urine with UTI, given antibiotics, pain meds.  Evelina Bucy, MD 09/28/13 [phone]

## 2013-09-28 NOTE — ED Notes (Signed)
Pt c/o pain to abdomen, left flank and back onset last night. Pt reports nausea, increase frequency in urination with small amounts.

## 2013-10-03 ENCOUNTER — Encounter: Payer: Self-pay | Admitting: Family Medicine

## 2013-10-03 ENCOUNTER — Ambulatory Visit (INDEPENDENT_AMBULATORY_CARE_PROVIDER_SITE_OTHER): Payer: Medicare Other | Admitting: Family Medicine

## 2013-10-03 VITALS — BP 136/82 | HR 80 | Temp 98.0°F | Resp 16 | Wt 270.0 lb

## 2013-10-03 DIAGNOSIS — N3000 Acute cystitis without hematuria: Secondary | ICD-10-CM | POA: Diagnosis not present

## 2013-10-03 DIAGNOSIS — N390 Urinary tract infection, site not specified: Secondary | ICD-10-CM | POA: Insufficient documentation

## 2013-10-03 LAB — CBC WITH DIFFERENTIAL/PLATELET
Basophils Absolute: 0 10*3/uL (ref 0.0–0.1)
Basophils Relative: 0 % (ref 0–1)
EOS ABS: 0.2 10*3/uL (ref 0.0–0.7)
EOS PCT: 2 % (ref 0–5)
HCT: 36.8 % (ref 36.0–46.0)
HEMOGLOBIN: 12.3 g/dL (ref 12.0–15.0)
LYMPHS PCT: 37 % (ref 12–46)
Lymphs Abs: 3.3 10*3/uL (ref 0.7–4.0)
MCH: 26.2 pg (ref 26.0–34.0)
MCHC: 33.4 g/dL (ref 30.0–36.0)
MCV: 78.3 fL (ref 78.0–100.0)
Monocytes Absolute: 0.5 10*3/uL (ref 0.1–1.0)
Monocytes Relative: 6 % (ref 3–12)
NEUTROS PCT: 55 % (ref 43–77)
Neutro Abs: 4.9 10*3/uL (ref 1.7–7.7)
PLATELETS: 400 10*3/uL (ref 150–400)
RBC: 4.7 MIL/uL (ref 3.87–5.11)
RDW: 15.7 % — ABNORMAL HIGH (ref 11.5–15.5)
WBC: 8.9 10*3/uL (ref 4.0–10.5)

## 2013-10-03 LAB — BASIC METABOLIC PANEL
BUN: 22 mg/dL (ref 6–23)
CHLORIDE: 106 meq/L (ref 96–112)
CO2: 25 mEq/L (ref 19–32)
Calcium: 9 mg/dL (ref 8.4–10.5)
Creat: 1.16 mg/dL — ABNORMAL HIGH (ref 0.50–1.10)
Glucose, Bld: 148 mg/dL — ABNORMAL HIGH (ref 70–99)
POTASSIUM: 3.7 meq/L (ref 3.5–5.3)
Sodium: 139 mEq/L (ref 135–145)

## 2013-10-03 MED ORDER — PHENAZOPYRIDINE HCL 200 MG PO TABS: 200.0000 mg | ORAL_TABLET | Freq: Three times a day (TID) | ORAL | Status: DC

## 2013-10-03 NOTE — Patient Instructions (Signed)
Please take the Pyridium 200 mg, three times a day for two days.  If this does not start to go away within the next 5 days, please let us know.  Thanks, Dr. Awanda Mink

## 2013-10-03 NOTE — Progress Notes (Signed)
Kathryn Crosby is a 53 y.o. female who presents today for ED f/u for possible UTI  Pt seen in the ED about 5 days ago now for ongoing L flank pain, suprapubic pain, that was similar to her previous renal stones.  She follows with urology for her renal stones and felt that she may be having another one.  CT scan in the ED did not show this and her CBC was slightly elevated to 10.5 and BMET was basically normal, UA showing possible UTI.  She was tx with Bactrim DS and Vicodin but is still having this suprapubic pain and also flank pain.  She denies any fever, chills, sweats, dizziness, blurred vision, diarrhea, vomiting, but is having a little bit of nausea.  Compliant with her ABx at this point and denies rash or anything similar.   Past Medical History  Diagnosis Date  . Diabetes mellitus   . Hypertension   . Hyperlipidemia   . Nephrolithiasis   . GERD (gastroesophageal reflux disease)   . Migraine   . Kidney stones   . Chronic headaches     History  Smoking status  . Never Smoker   Smokeless tobacco  . Never Used    Family History  Problem Relation Age of Onset  . Coronary artery disease Brother     CABG age 40  . Diabetes Mother   . Hypertension Mother   . Kidney disease Mother     Current Outpatient Prescriptions on File Prior to Visit  Medication Sig Dispense Refill  . amLODipine (NORVASC) 5 MG tablet Take 1 tablet (5 mg total) by mouth daily.  90 tablet  3  . glimepiride (AMARYL) 2 MG tablet Take 4 mg by mouth daily with breakfast.      . hydrochlorothiazide (HYDRODIURIL) 25 MG tablet Take 25 mg by mouth daily.      Marland Kitchen HYDROcodone-acetaminophen (NORCO/VICODIN) 5-325 MG per tablet Take 1 tablet by mouth every 6 (six) hours as needed for moderate pain.  20 tablet  0  . insulin glargine (LANTUS) 100 UNIT/ML injection Inject 40 Units into the skin daily.      . Liraglutide (VICTOZA) 18 MG/3ML SOPN Inject 1.2 mg into the skin daily.  12 mL  5  . lisinopril (PRINIVIL,ZESTRIL)  40 MG tablet Take 40 mg by mouth daily.      Marland Kitchen losartan (COZAAR) 50 MG tablet Take 50 mg by mouth daily.      . metFORMIN (GLUCOPHAGE) 1000 MG tablet Take 1,000 mg by mouth 2 (two) times daily with a meal.      . metoprolol (TOPROL-XL) 200 MG 24 hr tablet Take 200 mg by mouth daily.      . naproxen sodium (ANAPROX) 220 MG tablet Take 440 mg by mouth 2 (two) times daily as needed (pain).      Marland Kitchen omeprazole (PRILOSEC) 40 MG capsule Take 40 mg by mouth daily.      . ondansetron (ZOFRAN) 4 MG tablet Take 4 mg by mouth every 8 (eight) hours as needed for nausea or vomiting.       . potassium citrate (UROCIT-K 10) 10 MEQ (1080 MG) SR tablet Take 20 mEq by mouth 2 (two) times daily.       . simvastatin (ZOCOR) 40 MG tablet Take 40 mg by mouth every evening.      . sulfamethoxazole-trimethoprim (BACTRIM DS) 800-160 MG per tablet Take 1 tablet by mouth 2 (two) times daily.  14 tablet  0  . SUMAtriptan (IMITREX)  50 MG tablet Take 1 tablet (50 mg total) by mouth once. May repeat in 2 hours if headache persists or recurs.  10 tablet  0   Current Facility-Administered Medications on File Prior to Visit  Medication Dose Route Frequency Provider Last Rate Last Dose  . triamcinolone acetonide (KENALOG) 10 MG/ML injection 10 mg  10 mg Intra-articular Once Amber Fidel Levy, MD        ROS: Per HPI.  All other systems reviewed and are negative.   Physical Exam Filed Vitals:   10/03/13 1012  BP: 136/82  Pulse: 80  Temp: 98 F (36.7 C)  Resp: 16    Physical Examination: General appearance - alert, well appearing, and in no distress Chest - clear to auscultation, no wheezes, rales or rhonchi, symmetric air entry Heart - normal rate and regular rhythm Abdomen - soft, nontender, nondistended, no masses or organomegaly Back: + L CVA tenderness, no R CVA tenderness, + TTP at the paraspinal musculature B/L     Chemistry      Component Value Date/Time   NA 140 09/28/2013 1920   K 3.4* 09/28/2013 1920   CL  100 09/28/2013 1920   CO2 27 09/28/2013 1920   BUN 15 09/28/2013 1920   CREATININE 0.81 09/28/2013 1920   CREATININE 0.86 07/29/2013 1024      Component Value Date/Time   CALCIUM 8.9 09/28/2013 1920   ALKPHOS 77 03/29/2013 1015   AST 9 03/29/2013 1015   ALT 8 03/29/2013 1015   BILITOT 0.5 03/29/2013 1015      Lab Results  Component Value Date   WBC 10.8* 09/28/2013   HGB 13.2 09/28/2013   HCT 39.7 09/28/2013   MCV 80.0 09/28/2013   PLT 392 09/28/2013    CT abdomen 09/28/13 IMPRESSION:  1. Bilateral nonobstructing nephrolithiasis.  2. No evidence for ureteral obstruction or inflammation.  3. Sub cm lymph nodes within the small bowel mesenteries. These are  nonspecific, but could be related to enteritis.  4. Degenerative changes of the lumbar spine.  5. Status post hysterectomy.

## 2013-10-03 NOTE — Assessment & Plan Note (Signed)
Pt seen in the ED about 5 days ago now for L flank pain, CT scan of her abdomen showed stable nephrolithiasis and UA showed possible UTI which she is being Tx with Bactrim DS.  Afebrile today, VSS, but does have some L CVA tenderness on exam today.  Will obtain BMET and CBC to make sure she is not having mild pyelo that would require change in ABx or possible short term admission.  Start Pyridium 200 mg TID x 2 days as well.

## 2013-10-11 ENCOUNTER — Other Ambulatory Visit: Payer: Self-pay | Admitting: Family Medicine

## 2013-10-13 NOTE — Telephone Encounter (Signed)
Refill given for one month. Please inform the patient that she needs to come in for a follow-up appointment in the next month. Thanks.

## 2013-10-14 ENCOUNTER — Encounter: Payer: Self-pay | Admitting: *Deleted

## 2013-10-16 ENCOUNTER — Other Ambulatory Visit: Payer: Self-pay | Admitting: *Deleted

## 2013-10-16 MED ORDER — LOSARTAN POTASSIUM 50 MG PO TABS: 50.0000 mg | ORAL_TABLET | Freq: Every day | ORAL | Status: DC

## 2013-10-16 MED ORDER — METFORMIN HCL 1000 MG PO TABS: 1000.0000 mg | ORAL_TABLET | Freq: Two times a day (BID) | ORAL | Status: DC

## 2013-10-16 MED ORDER — POTASSIUM CITRATE ER 10 MEQ (1080 MG) PO TBCR: 20.0000 meq | EXTENDED_RELEASE_TABLET | Freq: Two times a day (BID) | ORAL | Status: DC

## 2013-10-18 ENCOUNTER — Encounter: Payer: Self-pay | Admitting: Family Medicine

## 2013-10-18 ENCOUNTER — Other Ambulatory Visit: Payer: Self-pay | Admitting: Pediatrics

## 2013-10-18 ENCOUNTER — Ambulatory Visit (INDEPENDENT_AMBULATORY_CARE_PROVIDER_SITE_OTHER): Payer: Medicare Other | Admitting: Family Medicine

## 2013-10-18 VITALS — BP 128/90 | HR 72 | Temp 97.9°F | Wt 267.0 lb

## 2013-10-18 DIAGNOSIS — E1165 Type 2 diabetes mellitus with hyperglycemia: Secondary | ICD-10-CM

## 2013-10-18 DIAGNOSIS — N200 Calculus of kidney: Secondary | ICD-10-CM

## 2013-10-18 DIAGNOSIS — IMO0001 Reserved for inherently not codable concepts without codable children: Secondary | ICD-10-CM

## 2013-10-18 DIAGNOSIS — Z23 Encounter for immunization: Secondary | ICD-10-CM | POA: Diagnosis not present

## 2013-10-18 DIAGNOSIS — E1149 Type 2 diabetes mellitus with other diabetic neurological complication: Secondary | ICD-10-CM

## 2013-10-18 DIAGNOSIS — IMO0002 Reserved for concepts with insufficient information to code with codable children: Secondary | ICD-10-CM

## 2013-10-18 DIAGNOSIS — I10 Essential (primary) hypertension: Secondary | ICD-10-CM | POA: Diagnosis not present

## 2013-10-18 LAB — BASIC METABOLIC PANEL
BUN: 16 mg/dL (ref 6–23)
CO2: 27 meq/L (ref 19–32)
Calcium: 9 mg/dL (ref 8.4–10.5)
Chloride: 106 mEq/L (ref 96–112)
Creat: 0.81 mg/dL (ref 0.50–1.10)
Glucose, Bld: 82 mg/dL (ref 70–99)
POTASSIUM: 3.2 meq/L — AB (ref 3.5–5.3)
Sodium: 142 mEq/L (ref 135–145)

## 2013-10-18 LAB — POCT GLYCOSYLATED HEMOGLOBIN (HGB A1C): HEMOGLOBIN A1C: 6.8

## 2013-10-18 MED ORDER — GLIMEPIRIDE 2 MG PO TABS: 4.0000 mg | ORAL_TABLET | Freq: Every day | ORAL | Status: DC

## 2013-10-18 MED ORDER — METFORMIN HCL 1000 MG PO TABS: 1000.0000 mg | ORAL_TABLET | Freq: Two times a day (BID) | ORAL | Status: DC

## 2013-10-18 MED ORDER — GABAPENTIN 100 MG PO CAPS: 100.0000 mg | ORAL_CAPSULE | Freq: Three times a day (TID) | ORAL | Status: DC

## 2013-10-18 MED ORDER — AMLODIPINE BESYLATE 10 MG PO TABS: 10.0000 mg | ORAL_TABLET | Freq: Every day | ORAL | Status: DC

## 2013-10-18 MED ORDER — METOPROLOL SUCCINATE ER 200 MG PO TB24: 200.0000 mg | ORAL_TABLET | Freq: Every day | ORAL | Status: DC

## 2013-10-18 MED ORDER — SIMVASTATIN 40 MG PO TABS: 40.0000 mg | ORAL_TABLET | Freq: Every evening | ORAL | Status: DC

## 2013-10-18 MED ORDER — OMEPRAZOLE 40 MG PO CPDR: 40.0000 mg | DELAYED_RELEASE_CAPSULE | Freq: Every day | ORAL | Status: DC

## 2013-10-18 MED ORDER — LISINOPRIL 40 MG PO TABS: 40.0000 mg | ORAL_TABLET | Freq: Every day | ORAL | Status: DC

## 2013-10-18 MED ORDER — LIRAGLUTIDE 18 MG/3ML ~~LOC~~ SOPN: 1.2000 mg | PEN_INJECTOR | Freq: Every day | SUBCUTANEOUS | Status: DC

## 2013-10-18 MED ORDER — HYDROCHLOROTHIAZIDE 25 MG PO TABS: ORAL_TABLET | ORAL | Status: DC

## 2013-10-18 NOTE — Progress Notes (Signed)
Patient ID: Kathryn Crosby, female   DOB: 01-Feb-1960, 53 y.o.   MRN: [id number]  Tommi Rumps, MD Phone: [phone]  Kathryn Crosby is a 53 y.o. female who presents today for f/u.  HYPERTENSION Disease Monitoring Home BP Monitoring last check was 160/86 at home Chest pain- no    Dyspnea- no Medications Compliance-  Taking metoprolol, losartan, lisinopril, HCTZ, amlodipine.  Edema- no  DIABETES Disease Monitoring: Blood Sugar ranges-60's-90's Polyuria/phagia/dipsia- no     Medications: Compliance- taking victoza, lantus 40 u, metformin, and amaryl Hypoglycemic symptoms- no  Neuropathy: patient notes she has had increasing tingling and mild numbness in her feet and hands bilaterally. It is in the whole foot and her fingers. Described as pins and needles. She has not taken any medications for this. No weakness. No other neurological symptoms.    Patient is a nonsmoker.   ROS: Per HPI   Physical Exam Filed Vitals:   10/18/13 1001  BP: 128/90  Pulse: 72  Temp: 97.9 F (36.6 C)    Gen: Well NAD HEENT: PERRL,  MMM Lungs: CTABL Nl WOB Heart: RRR no MRG Feet: microfilament testing normal, no sores or ulcers noted Neuro: CN 2-12 intact, 5/5 strength in bilateral biceps, triceps, grip, quads, hamstrings, plantar and dorsiflexion, sensation to light touch intact in bilateral UE and LE, normal gait, 2+ patellar reflexes Exts: Non edematous BL  LE, warm and well perfused.    Assessment/Plan: Please see individual problem list.  # Healthcare maintenance: flu shot and foot exam performed  Tommi Rumps, MD [facility] PGY-3

## 2013-10-18 NOTE — Patient Instructions (Signed)
Nice to see you. We are going to start gabapentin for your neuropathy. Please stop the lantus and losartan. We are going to increase you amlodipine dose.  We are going to get an ultrasound of your kidneys given your elevated blood pressure.

## 2013-10-18 NOTE — Assessment & Plan Note (Signed)
At goal with A1c of 6.8. She reports some low values on home sugar checks. Dr Sheral Apley previously discussed stopped lantus, though patient did not do this. Will ask her to hold her lantus at this time. She is to continue her metformin, victoza, and amaryl. F/u in one month to observe cbg trend off of lantus.

## 2013-10-18 NOTE — Assessment & Plan Note (Signed)
At goal on recheck. On multiple blood pressure medications. Will obtain renal duplex to rule out renal artery stenosis as cause of HTN. Will stop losartan as she is on Lisinopril as well. Will increase amlodipine to 10 mg. Will continue her other medications. F/u in 1 month.

## 2013-10-18 NOTE — Assessment & Plan Note (Signed)
Symptoms consistent with neuropathy. Will start on gabapentin. F/u one month.

## 2013-10-19 ENCOUNTER — Telehealth: Payer: Self-pay | Admitting: Family Medicine

## 2013-10-19 DIAGNOSIS — E876 Hypokalemia: Secondary | ICD-10-CM

## 2013-10-19 NOTE — Telephone Encounter (Signed)
Called patient to discuss BMET results. Patients potassium is mildly low at 3.2. It appears that it has been at this level previously and she has a documented problem of hypokalemia on her problem list. She is on potassium citrate for her kidney stones, so is likely getting some potassium from that source. I left a message outlining that he potassium was low, though near normal and not at a dangerously low level. I advised the patient to increase her intake of potassium rich foods such as spinach, baked potatoes, yogurt, mushrooms, and bananas. I asked that she call the office with any questions. I would like her to return to the office for a lab appointment on Wednesday or Thursday for a repeat BMET.

## 2013-10-23 ENCOUNTER — Other Ambulatory Visit: Payer: Medicare Other

## 2013-10-23 DIAGNOSIS — E876 Hypokalemia: Secondary | ICD-10-CM

## 2013-10-23 LAB — BASIC METABOLIC PANEL
BUN: 17 mg/dL (ref 6–23)
CHLORIDE: 104 meq/L (ref 96–112)
CO2: 25 meq/L (ref 19–32)
CREATININE: 0.9 mg/dL (ref 0.50–1.10)
Calcium: 9.2 mg/dL (ref 8.4–10.5)
GLUCOSE: 87 mg/dL (ref 70–99)
POTASSIUM: 3.8 meq/L (ref 3.5–5.3)
Sodium: 143 mEq/L (ref 135–145)

## 2013-10-23 NOTE — Progress Notes (Signed)
BMP DONE TODAY Dilraj Killgore 

## 2013-10-28 ENCOUNTER — Encounter: Payer: Self-pay | Admitting: Family Medicine

## 2013-11-01 ENCOUNTER — Other Ambulatory Visit: Payer: Medicare Other

## 2013-11-04 ENCOUNTER — Ambulatory Visit
Admission: RE | Admit: 2013-11-04 | Discharge: 2013-11-04 | Disposition: A | Discharge status: Home / Self Care | Payer: Medicare Other | Source: Ambulatory Visit | Attending: Family Medicine | Admitting: Family Medicine

## 2013-11-04 DIAGNOSIS — I10 Essential (primary) hypertension: Secondary | ICD-10-CM | POA: Diagnosis not present

## 2013-11-04 DIAGNOSIS — N200 Calculus of kidney: Secondary | ICD-10-CM | POA: Diagnosis not present

## 2013-11-04 DIAGNOSIS — H05243 Constant exophthalmos, bilateral: Secondary | ICD-10-CM | POA: Diagnosis not present

## 2013-11-04 DIAGNOSIS — H15102 Unspecified episcleritis, left eye: Secondary | ICD-10-CM | POA: Diagnosis not present

## 2013-11-04 DIAGNOSIS — H0289 Other specified disorders of eyelid: Secondary | ICD-10-CM | POA: Diagnosis not present

## 2013-11-04 DIAGNOSIS — H04123 Dry eye syndrome of bilateral lacrimal glands: Secondary | ICD-10-CM | POA: Diagnosis not present

## 2013-11-04 DIAGNOSIS — N281 Cyst of kidney, acquired: Secondary | ICD-10-CM | POA: Diagnosis not present

## 2013-11-05 ENCOUNTER — Encounter: Payer: Self-pay | Admitting: Family Medicine

## 2013-11-11 DIAGNOSIS — H05243 Constant exophthalmos, bilateral: Secondary | ICD-10-CM | POA: Diagnosis not present

## 2013-11-11 DIAGNOSIS — H04123 Dry eye syndrome of bilateral lacrimal glands: Secondary | ICD-10-CM | POA: Diagnosis not present

## 2013-11-11 DIAGNOSIS — H0289 Other specified disorders of eyelid: Secondary | ICD-10-CM | POA: Diagnosis not present

## 2013-11-19 ENCOUNTER — Encounter: Payer: Self-pay | Admitting: Family Medicine

## 2013-11-19 ENCOUNTER — Ambulatory Visit (INDEPENDENT_AMBULATORY_CARE_PROVIDER_SITE_OTHER): Payer: Medicare Other | Admitting: Family Medicine

## 2013-11-19 VITALS — BP 128/85 | HR 79 | Temp 98.4°F | Wt 269.0 lb

## 2013-11-19 DIAGNOSIS — I10 Essential (primary) hypertension: Secondary | ICD-10-CM | POA: Diagnosis not present

## 2013-11-19 DIAGNOSIS — R011 Cardiac murmur, unspecified: Secondary | ICD-10-CM | POA: Diagnosis not present

## 2013-11-19 DIAGNOSIS — E1142 Type 2 diabetes mellitus with diabetic polyneuropathy: Secondary | ICD-10-CM

## 2013-11-19 LAB — BASIC METABOLIC PANEL
BUN: 18 mg/dL (ref 6–23)
CO2: 31 meq/L (ref 19–32)
Calcium: 9.4 mg/dL (ref 8.4–10.5)
Chloride: 101 mEq/L (ref 96–112)
Creat: 0.92 mg/dL (ref 0.50–1.10)
GLUCOSE: 101 mg/dL — AB (ref 70–99)
POTASSIUM: 3.5 meq/L (ref 3.5–5.3)
Sodium: 141 mEq/L (ref 135–145)

## 2013-11-19 NOTE — Assessment & Plan Note (Signed)
At goal with cbgs. Will continue to monitor. F/u in 2 months for A1c.

## 2013-11-19 NOTE — Assessment & Plan Note (Addendum)
Newly noted. Echo in March 2015 with grade I diastolic dysfunction, trivial regurg tricuspid and mild regurg pulmonic valve. No orthopnea, dyspnea, or PND. No radiation of murmur. Not anemic on CBC one month ago. Will continue to monitor. If develops symptoms or if murmur worsens will consider further work-up.

## 2013-11-19 NOTE — Patient Instructions (Signed)
Nice to see you. You are doing great.  We will see you back in two months.

## 2013-11-19 NOTE — Assessment & Plan Note (Signed)
Improved on gabapentin. Will continue gabapentin BID. F/u in 3 months.

## 2013-11-19 NOTE — Assessment & Plan Note (Signed)
At goal. No RAS on [place]. Will continue current regimen. F/u in 2 months.

## 2013-11-19 NOTE — Progress Notes (Signed)
Patient ID: Kathryn Crosby, female   DOB: 06-Feb-1960, 53 y.o.   MRN: [id number]  Tommi Rumps, MD Phone: [phone]  Kathryn Crosby is a 53 y.o. female who presents today for f/u.  HYPERTENSION Disease Monitoring Home BP Monitoring 130's/80's typically Chest pain- no    Dyspnea- no Medications Compliance-  taking.  Edema- no  DIABETES Disease Monitoring: Blood Sugar ranges-76-92 Polyuria/phagia/dipsia- no       Medications: Compliance- taking, has come off lantus as discussed at last visit Hypoglycemic symptoms- yes, with the one value of 76, ate something and felt improved  Neuropathy: notes this is improved with the gabapentin. Notes mild tingling on Sunday in bilateral feet. No more tingling in hands. Is taking gabapentin BID.  Heart murmur: Newly noted. Echo in March 2015 with grade I diastolic dysfunction, trivial regurg tricuspid and mild regurg pulmonic valve. No orthopnea, dyspnea, or PND. Notes mother with history of heart murmur. No abnormal bleeding recetnly.   Patient is a nonsmoker.   ROS: Per HPI   Physical Exam Filed Vitals:   11/19/13 0938  BP: 128/85  Pulse: 79  Temp: 98.4 F (36.9 C)    Gen: Well NAD HEENT: PERRL,  MMM Lungs: CTABL Nl WOB Heart: RRR 2/6 systolic murmur RUSB no radiation to the carotids no RG Feet: bilateral feet with 5/5 strength in plantar and dorsiflexion and inversion and eversion, sensation to light tough intact bilateral feet, no lesions noted, 2+ DP pulses Exts: Non edematous BL  LE, warm and well perfused.    Assessment/Plan: Please see individual problem list.  # Healthcare maintenance: tdap at next visit  Tommi Rumps, MD [place] PGY-3

## 2013-11-20 ENCOUNTER — Encounter: Payer: Self-pay | Admitting: Family Medicine

## 2013-12-02 ENCOUNTER — Encounter: Payer: Self-pay | Admitting: Family Medicine

## 2014-01-14 ENCOUNTER — Encounter: Payer: Self-pay | Admitting: Cardiology

## 2014-01-15 ENCOUNTER — Encounter: Payer: Self-pay | Admitting: Family Medicine

## 2014-01-15 ENCOUNTER — Ambulatory Visit (INDEPENDENT_AMBULATORY_CARE_PROVIDER_SITE_OTHER): Payer: Medicare Other | Admitting: Family Medicine

## 2014-01-15 VITALS — BP 135/90 | HR 80 | Temp 97.8°F | Ht 68.0 in | Wt 266.0 lb

## 2014-01-15 DIAGNOSIS — D229 Melanocytic nevi, unspecified: Secondary | ICD-10-CM

## 2014-01-15 DIAGNOSIS — E1142 Type 2 diabetes mellitus with diabetic polyneuropathy: Secondary | ICD-10-CM | POA: Diagnosis not present

## 2014-01-15 DIAGNOSIS — M25552 Pain in left hip: Secondary | ICD-10-CM

## 2014-01-15 DIAGNOSIS — M25561 Pain in right knee: Secondary | ICD-10-CM | POA: Diagnosis not present

## 2014-01-15 DIAGNOSIS — M25562 Pain in left knee: Secondary | ICD-10-CM | POA: Diagnosis not present

## 2014-01-15 LAB — POCT GLYCOSYLATED HEMOGLOBIN (HGB A1C): HEMOGLOBIN A1C: 6.7

## 2014-01-15 MED ORDER — NAPROXEN 500 MG PO TABS: 500.0000 mg | ORAL_TABLET | Freq: Two times a day (BID) | ORAL | Status: DC | PRN

## 2014-01-15 MED ORDER — TETANUS-DIPHTH-ACELL PERTUSSIS 5-2.5-18.5 LF-MCG/0.5 IM SUSP: 0.5000 mL | Freq: Once | INTRAMUSCULAR | Status: DC

## 2014-01-15 NOTE — Patient Instructions (Signed)
Nice to see you. Please go get the x-rays of your hip and knees. We will call with the results. Please schedule an appointment for your moles to be removed.

## 2014-01-17 DIAGNOSIS — M25552 Pain in left hip: Secondary | ICD-10-CM | POA: Insufficient documentation

## 2014-01-17 DIAGNOSIS — D229 Melanocytic nevi, unspecified: Secondary | ICD-10-CM | POA: Insufficient documentation

## 2014-01-17 DIAGNOSIS — M179 Osteoarthritis of knee, unspecified: Secondary | ICD-10-CM | POA: Insufficient documentation

## 2014-01-17 DIAGNOSIS — M1710 Unilateral primary osteoarthritis, unspecified knee: Secondary | ICD-10-CM | POA: Insufficient documentation

## 2014-01-17 NOTE — Assessment & Plan Note (Signed)
At goal. Continue current regimen. 

## 2014-01-17 NOTE — Assessment & Plan Note (Signed)
Likely related to OA given pain with internal rotation. Will obtain plane films of left hip. Aleve prn for pain.

## 2014-01-17 NOTE — Assessment & Plan Note (Signed)
Patient with multiple nevi on her neck that get irritated due to her clothing. Appear to be benign in nature. Advised to schedule an appointment for removal of these lesions.

## 2014-01-17 NOTE — Assessment & Plan Note (Signed)
Likely related to OA. No structural abnormalities on exam. Will obtain standing plane films. Aleve prn for pain.

## 2014-01-17 NOTE — Progress Notes (Signed)
Patient ID: Kathryn Crosby, female   DOB: Nov 02, 1960, 53 y.o.   MRN: [id number]  Tommi Rumps, MD Phone: [phone]  Kathryn Crosby is a 53 y.o. female who presents today for f/u.  DIABETES Disease Monitoring: Blood Sugar ranges-typically in the low 100's, low of 83 Polyuria/phagia/dipsia- no      Medications: Compliance- taking metformin, amaryl, victoza Hypoglycemic symptoms- no  Knee pain: states present for 6-7 years. Bilateral. Stiff and painfull especially after being immobile for a period of time. It is a stabbing pain. No catching, popping, or locking.  Groin pain: she notes this has been present for 1 month. Is a catching sensation. Occurs when she tries to get up or move. Is intermittent. Achey pain. Has been taking vicodin for this. No injury noted.  Moles: has several on her neck that get irritated by her shirt collar. She has had them removed years ago though they always grow back. They have not changed or grown recently.  Patient is a nonsmoker.   ROS: Per HPI   Physical Exam Filed Vitals:   01/15/14 1527  BP: 135/90  Pulse: 80  Temp: 97.8 F (36.6 C)    Gen: Well NAD HEENT: PERRL,  MMM Lungs: CTABL Nl WOB Heart: RRR  Abd: soft, NT, ND MSK: bilateral knees with no swelling, erythema, joint line tenderness, or ligamentous laxity, negative mcmurray sign bilaterally, left hip with pain on internal rotation, no tenderness to palpation of the left hip or trochanteric area, good ROM, right hip with no pain on ROM, no tenderness Exts: Non edematous BL  LE, warm and well perfused.  Skin: 5 moles scattered at the based of her neck, no erythema or bleeding, normal appearing shapes   Assessment/Plan: Please see individual problem list.  Tommi Rumps, MD [place] PGY-3

## 2014-01-20 ENCOUNTER — Telehealth: Payer: Self-pay | Admitting: *Deleted

## 2014-01-20 ENCOUNTER — Other Ambulatory Visit: Payer: Self-pay | Admitting: Family Medicine

## 2014-01-20 ENCOUNTER — Ambulatory Visit
Admission: RE | Admit: 2014-01-20 | Discharge: 2014-01-20 | Disposition: A | Discharge status: Home / Self Care | Payer: Medicare Other | Source: Ambulatory Visit | Attending: Family Medicine | Admitting: Family Medicine

## 2014-01-20 DIAGNOSIS — M25552 Pain in left hip: Secondary | ICD-10-CM

## 2014-01-20 DIAGNOSIS — M25562 Pain in left knee: Principal | ICD-10-CM

## 2014-01-20 DIAGNOSIS — M25561 Pain in right knee: Secondary | ICD-10-CM

## 2014-01-20 DIAGNOSIS — M1711 Unilateral primary osteoarthritis, right knee: Secondary | ICD-10-CM | POA: Diagnosis not present

## 2014-01-20 DIAGNOSIS — M1612 Unilateral primary osteoarthritis, left hip: Secondary | ICD-10-CM | POA: Diagnosis not present

## 2014-01-20 NOTE — Telephone Encounter (Signed)
Pt is aware of results.  Voiced understanding on instructions. Jazmin Hartsell,CMA

## 2014-01-20 NOTE — Telephone Encounter (Signed)
-----   Message from Leone Haven, MD sent at 01/20/2014  3:15 PM EST ----- Please inform the patient that her x-rays revealed arthritis. She should continue the aleve for pain as needed.

## 2014-02-08 ENCOUNTER — Other Ambulatory Visit: Payer: Self-pay | Admitting: Family Medicine

## 2014-02-15 ENCOUNTER — Emergency Department (HOSPITAL_COMMUNITY): Payer: Medicare Other

## 2014-02-15 ENCOUNTER — Encounter (HOSPITAL_COMMUNITY): Payer: Self-pay | Admitting: *Deleted

## 2014-02-15 ENCOUNTER — Emergency Department (HOSPITAL_COMMUNITY)
Admission: EM | Admit: 2014-02-15 | Discharge: 2014-02-15 | Disposition: A | Discharge status: Home / Self Care | Payer: Medicare Other | Attending: Emergency Medicine | Admitting: Emergency Medicine

## 2014-02-15 DIAGNOSIS — N390 Urinary tract infection, site not specified: Secondary | ICD-10-CM | POA: Diagnosis not present

## 2014-02-15 DIAGNOSIS — E785 Hyperlipidemia, unspecified: Secondary | ICD-10-CM | POA: Diagnosis not present

## 2014-02-15 DIAGNOSIS — G8929 Other chronic pain: Secondary | ICD-10-CM | POA: Insufficient documentation

## 2014-02-15 DIAGNOSIS — K219 Gastro-esophageal reflux disease without esophagitis: Secondary | ICD-10-CM | POA: Insufficient documentation

## 2014-02-15 DIAGNOSIS — S8991XA Unspecified injury of right lower leg, initial encounter: Secondary | ICD-10-CM | POA: Diagnosis not present

## 2014-02-15 DIAGNOSIS — M25561 Pain in right knee: Secondary | ICD-10-CM

## 2014-02-15 DIAGNOSIS — S8002XA Contusion of left knee, initial encounter: Secondary | ICD-10-CM | POA: Diagnosis not present

## 2014-02-15 DIAGNOSIS — Y998 Other external cause status: Secondary | ICD-10-CM | POA: Insufficient documentation

## 2014-02-15 DIAGNOSIS — G43909 Migraine, unspecified, not intractable, without status migrainosus: Secondary | ICD-10-CM | POA: Insufficient documentation

## 2014-02-15 DIAGNOSIS — W108XXA Fall (on) (from) other stairs and steps, initial encounter: Secondary | ICD-10-CM | POA: Diagnosis not present

## 2014-02-15 DIAGNOSIS — I10 Essential (primary) hypertension: Secondary | ICD-10-CM | POA: Insufficient documentation

## 2014-02-15 DIAGNOSIS — Y9289 Other specified places as the place of occurrence of the external cause: Secondary | ICD-10-CM | POA: Insufficient documentation

## 2014-02-15 DIAGNOSIS — Y9301 Activity, walking, marching and hiking: Secondary | ICD-10-CM | POA: Diagnosis not present

## 2014-02-15 DIAGNOSIS — Z79899 Other long term (current) drug therapy: Secondary | ICD-10-CM | POA: Insufficient documentation

## 2014-02-15 DIAGNOSIS — M25562 Pain in left knee: Secondary | ICD-10-CM | POA: Diagnosis not present

## 2014-02-15 DIAGNOSIS — R55 Syncope and collapse: Secondary | ICD-10-CM

## 2014-02-15 DIAGNOSIS — S8001XA Contusion of right knee, initial encounter: Secondary | ICD-10-CM | POA: Diagnosis not present

## 2014-02-15 DIAGNOSIS — S8992XA Unspecified injury of left lower leg, initial encounter: Secondary | ICD-10-CM | POA: Diagnosis not present

## 2014-02-15 DIAGNOSIS — Z87442 Personal history of urinary calculi: Secondary | ICD-10-CM | POA: Insufficient documentation

## 2014-02-15 DIAGNOSIS — E119 Type 2 diabetes mellitus without complications: Secondary | ICD-10-CM | POA: Insufficient documentation

## 2014-02-15 DIAGNOSIS — R52 Pain, unspecified: Secondary | ICD-10-CM

## 2014-02-15 DIAGNOSIS — T148XXA Other injury of unspecified body region, initial encounter: Secondary | ICD-10-CM

## 2014-02-15 LAB — CBC
HCT: 37 % (ref 36.0–46.0)
Hemoglobin: 12 g/dL (ref 12.0–15.0)
MCH: 26.6 pg (ref 26.0–34.0)
MCHC: 32.4 g/dL (ref 30.0–36.0)
MCV: 82 fL (ref 78.0–100.0)
Platelets: 337 10*3/uL (ref 150–400)
RBC: 4.51 MIL/uL (ref 3.87–5.11)
RDW: 15.4 % (ref 11.5–15.5)
WBC: 10.1 10*3/uL (ref 4.0–10.5)

## 2014-02-15 LAB — URINALYSIS, ROUTINE W REFLEX MICROSCOPIC
Bilirubin Urine: NEGATIVE
Glucose, UA: NEGATIVE mg/dL
Ketones, ur: NEGATIVE mg/dL
NITRITE: NEGATIVE
Protein, ur: NEGATIVE mg/dL
SPECIFIC GRAVITY, URINE: 1.016 (ref 1.005–1.030)
UROBILINOGEN UA: 0.2 mg/dL (ref 0.0–1.0)
pH: 6.5 (ref 5.0–8.0)

## 2014-02-15 LAB — COMPREHENSIVE METABOLIC PANEL
ALT: 12 U/L (ref 0–35)
AST: 18 U/L (ref 0–37)
Albumin: 2.9 g/dL — ABNORMAL LOW (ref 3.5–5.2)
Alkaline Phosphatase: 76 U/L (ref 39–117)
Anion gap: 7 (ref 5–15)
BUN: 13 mg/dL (ref 6–23)
CHLORIDE: 105 meq/L (ref 96–112)
CO2: 28 mmol/L (ref 19–32)
CREATININE: 0.9 mg/dL (ref 0.50–1.10)
Calcium: 8.7 mg/dL (ref 8.4–10.5)
GFR calc Af Amer: 82 mL/min — ABNORMAL LOW (ref >90)
GFR, EST NON AFRICAN AMERICAN: 71 mL/min — AB (ref >90)
Glucose, Bld: 265 mg/dL — ABNORMAL HIGH (ref 70–99)
Potassium: 3.5 mmol/L (ref 3.5–5.1)
Sodium: 140 mmol/L (ref 135–145)
Total Bilirubin: 0.5 mg/dL (ref 0.3–1.2)
Total Protein: 6.2 g/dL (ref 6.0–8.3)

## 2014-02-15 LAB — CBG MONITORING, ED: Glucose-Capillary: 259 mg/dL — ABNORMAL HIGH (ref 70–99)

## 2014-02-15 LAB — URINE MICROSCOPIC-ADD ON

## 2014-02-15 MED ORDER — IBUPROFEN 400 MG PO TABS
800.0000 mg | ORAL_TABLET | Freq: Once | ORAL | Status: AC
  Administered 2014-02-15: 800 mg via ORAL
  Filled 2014-02-15: qty 2

## 2014-02-15 MED ORDER — CIPROFLOXACIN HCL 250 MG PO TABS: 500.0000 mg | ORAL_TABLET | Freq: Two times a day (BID) | ORAL | Status: DC

## 2014-02-15 NOTE — ED Notes (Signed)
Patient states she was walking up steps last night.  She states she was dizzy and broke out in a sweat.  Patient cbg was 75.  Patient states she landed flat on chest, abd, and knees.  Patient states she does not feel well.  Patient is complaining of bil knee pain.  She has bruising and swelling.  She has difficulty walking due ot pain.  Patient denies recent illness

## 2014-02-15 NOTE — ED Notes (Signed)
Dr. Walden at bedside 

## 2014-02-15 NOTE — ED Provider Notes (Signed)
CSN: [id number]     Arrival date & time 02/15/14  1322 History   First MD Initiated Contact with Patient 02/15/14 1651     Chief Complaint  Patient presents with  . Fall  . Knee Pain     (Consider location/radiation/quality/duration/timing/severity/associated sxs/prior Treatment) HPI 54 year old female with past medical history as below who presents ED complaining of a recent fall yesterday evening and subsequent bilateral knee pain. Patient states yesterday evening she began feeling lightheaded, warm during car ride and upon standing sxs worsened and pt "fell out." She reports having about 15 minutes of lightheadedness and warmth following this. She denies having any chest pain, palpitations, shortness of breath, nausea, vomiting, abdominal pain, other symptoms. She states a similar episode of lightheadedness happened last week which also resolved spontaneously after 15 minutes. She does state having a normal stress test last year. She states she's been her usual state of health and denies any recent illness. She states her blood sugars have been good. Prior to this she had been eating and drinking as she normally does. She denies any room spinning sensation, neck pain, numbness, tingling, weakness. No other complaints at this time. No family h/o sudden death or arrhythmia.  Past Medical History  Diagnosis Date  . Diabetes mellitus   . Hypertension   . Hyperlipidemia   . Nephrolithiasis   . GERD (gastroesophageal reflux disease)   . Migraine   . Kidney stones   . Chronic headaches    Past Surgical History  Procedure Laterality Date  . Abdominal hysterectomy    . Tonsillectomy    . Left ankle fracture    . Lithotripsy    . Sleep apnea     Family History  Problem Relation Age of Onset  . Coronary artery disease Brother     CABG age 26  . Diabetes Mother   . Hypertension Mother   . Kidney disease Mother    History  Substance Use Topics  . Smoking status: Never Smoker   .  Smokeless tobacco: Never Used  . Alcohol Use: No   OB History    Gravida Para Term Preterm AB TAB SAB Ectopic Multiple Living   3 3 3       3      Review of Systems  Constitutional: Negative for fever and chills.  HENT: Negative for congestion, rhinorrhea and sore throat.   Eyes: Negative for visual disturbance.  Respiratory: Negative for cough and shortness of breath.   Cardiovascular: Negative for chest pain, palpitations and leg swelling.  Gastrointestinal: Positive for nausea and abdominal pain. Negative for vomiting, diarrhea and constipation.  Genitourinary: Negative for dysuria, hematuria, vaginal bleeding and vaginal discharge.  Musculoskeletal: Positive for joint swelling and arthralgias. Negative for back pain and neck pain.  Skin: Negative for rash.  Neurological: Positive for syncope and light-headedness. Negative for dizziness, tremors, seizures, facial asymmetry, speech difficulty, weakness, numbness and headaches.  All other systems reviewed and are negative.     Allergies  Aspirin; Ivp dye; and Shellfish-derived products  Home Medications   Prior to Admission medications   Medication Sig Start Date End Date Taking? Authorizing Provider  amLODipine (NORVASC) 10 MG tablet Take 1 tablet (10 mg total) by mouth daily. 10/18/13 10/18/14 Yes Leone Haven, MD  fluconazole (DIFLUCAN) 150 MG tablet Take 150 mg by mouth once as needed (yeast infection).  01/07/14  Yes Historical Provider, MD  gabapentin (NEURONTIN) 100 MG capsule Take 1 capsule (100 mg total) by mouth 3 (  three) times daily. Patient taking differently: Take 100 mg by mouth 3 (three) times daily as needed (nerve pain).  10/18/13  Yes Leone Haven, MD  glimepiride (AMARYL) 2 MG tablet Take 2 tablets (4 mg total) by mouth daily with breakfast. 10/18/13  Yes Leone Haven, MD  hydrochlorothiazide (HYDRODIURIL) 25 MG tablet TAKE ONE TABLET BY MOUTH ONCE DAILY Patient taking differently: Take 25 mg by mouth  daily.  10/18/13  Yes Leone Haven, MD  Liraglutide (VICTOZA) 18 MG/3ML SOPN Inject 1.2 mg into the skin daily. Patient taking differently: Inject 1.2 mg into the skin at bedtime.  10/18/13  Yes Leone Haven, MD  lisinopril (PRINIVIL,ZESTRIL) 40 MG tablet Take 1 tablet (40 mg total) by mouth daily. 10/18/13  Yes Leone Haven, MD  metFORMIN (GLUCOPHAGE) 1000 MG tablet Take 1 tablet (1,000 mg total) by mouth 2 (two) times daily with a meal. 10/18/13  Yes Leone Haven, MD  metoprolol (TOPROL-XL) 200 MG 24 hr tablet Take 1 tablet (200 mg total) by mouth daily. 10/18/13  Yes Leone Haven, MD  naproxen (NAPROSYN) 500 MG tablet Take 1 tablet (500 mg total) by mouth 2 (two) times daily as needed for mild pain (with food). 01/15/14  Yes Leone Haven, MD  omeprazole (PRILOSEC) 40 MG capsule Take 1 capsule (40 mg total) by mouth daily. 10/18/13  Yes Leone Haven, MD  Polyvinyl Alcohol-Povidone (REFRESH OP) Place 1 drop into both eyes daily as needed (dry eyes).   Yes Historical Provider, MD  potassium citrate (UROCIT-K 10) 10 MEQ (1080 MG) SR tablet Take 2 tablets (20 mEq total) by mouth 2 (two) times daily. 10/16/13  Yes Leone Haven, MD  simvastatin (ZOCOR) 40 MG tablet Take 1 tablet (40 mg total) by mouth every evening. Patient taking differently: Take 40 mg by mouth at bedtime.  10/18/13  Yes Leone Haven, MD  SUMAtriptan (IMITREX) 50 MG tablet Take 1 tablet (50 mg total) by mouth once. May repeat in 2 hours if headache persists or recurs. Patient taking differently: Take 50 mg by mouth every 2 (two) hours as needed for migraine. May repeat in 2 hours if headache persists or recurs. 09/16/13  Yes Coral Spikes, DO  HYDROcodone-acetaminophen (NORCO/VICODIN) 5-325 MG per tablet Take 1 tablet by mouth every 6 (six) hours as needed for moderate pain. Patient not taking: Reported on 02/15/2014 09/28/13   Evelina Bucy, MD  Tdap Durwin Reges) 5-2.5-18.5 LF-MCG/0.5 injection Inject 0.5  mLs into the muscle once. Patient not taking: Reported on 02/15/2014 01/15/14   Leone Haven, MD  ULTICARE MINI PEN NEEDLES 31G X 6 MM MISC USE TWICE DAILY AS NEEDED FOR DOSING OF BYETTA. 02/10/14   Leone Haven, MD   BP 141/82 mmHg  Pulse 84  Temp(Src) 98.3 F (36.8 C) (Oral)  Resp 22  Ht 5\' 8"  (1.727 m)  Wt 271 lb (122.925 kg)  BMI 41.22 kg/m2  SpO2 97% Physical Exam  Constitutional: She is oriented to person, place, and time. She appears well-developed and well-nourished. No distress.  HENT:  Head: Normocephalic and atraumatic.  Eyes: Conjunctivae are normal. Pupils are equal, round, and reactive to light.  Neck: Normal range of motion.  Cardiovascular: Normal rate, regular rhythm, normal heart sounds and intact distal pulses.   No murmur heard. Pulmonary/Chest: Effort normal and breath sounds normal. No respiratory distress. She has no wheezes. She has no rales. She exhibits no tenderness.  Abdominal: Soft. Bowel sounds are  normal. She exhibits no distension.  Musculoskeletal: Normal range of motion.  Neurological: She is alert and oriented to person, place, and time. No cranial nerve deficit. GCS eye subscore is 4. GCS verbal subscore is 5. GCS motor subscore is 6.  HDS, AAOx4. PERRL, EOMI, TML, face sym. CN 2-12 grossly intact. 5/5 sym, no drift, SILT, normal gait and coordination.    Skin: Skin is warm and dry.  Psychiatric: She has a normal mood and affect.  Nursing note and vitals reviewed.   ED Course  Procedures (including critical care time) Labs Review Labs Reviewed  URINALYSIS, ROUTINE W REFLEX MICROSCOPIC - Abnormal; Notable for the following:    APPearance CLOUDY (*)    Hgb urine dipstick TRACE (*)    Leukocytes, UA MODERATE (*)    All other components within normal limits  COMPREHENSIVE METABOLIC PANEL - Abnormal; Notable for the following:    Glucose, Bld 265 (*)    Albumin 2.9 (*)    GFR calc non Af Amer 71 (*)    GFR calc Af Amer 82 (*)     All other components within normal limits  URINE MICROSCOPIC-ADD ON - Abnormal; Notable for the following:    Squamous Epithelial / LPF FEW (*)    Bacteria, UA FEW (*)    All other components within normal limits  CBG MONITORING, ED - Abnormal; Notable for the following:    Glucose-Capillary 259 (*)    All other components within normal limits  CBC    Imaging Review Dg Knee Complete 4 Views Left  02/15/2014   CLINICAL DATA:  54 year old female with bilateral knee pain after falling last night  EXAM: LEFT KNEE - COMPLETE 4+ VIEW  COMPARISON:  Concurrently obtained radiographs of the contralateral right knee  FINDINGS: No acute fracture, malalignment or evidence of the knee joint effusion. Mild degenerative osteoarthritis with narrowing of the medial compartment and osteophyte formation. No focal soft tissue abnormality.  IMPRESSION: 1. No acute fracture, malalignment or joint effusion. 2. Mild medial compartment osteoarthritis.   Electronically Signed   By: Jacqulynn Cadet M.D.   On: 02/15/2014 14:50   Dg Knee Complete 4 Views Right  02/15/2014   CLINICAL DATA:  [place] last night onto both knees, BILATERAL knee pain  EXAM: RIGHT KNEE - COMPLETE 4+ VIEW  COMPARISON:  01/20/2014  FINDINGS: Osseous demineralization.  Joint space narrowing and minimal spur formation greatest at medial compartment.  Mild anterior infrapatellar soft tissue swelling.  No acute fracture, dislocation, or bone destruction.  No knee joint effusion.  IMPRESSION: Mild osteoarthritic changes.  No acute abnormalities.  No interval change.   Electronically Signed   By: Lavonia Dana M.D.   On: 02/15/2014 14:51     EKG Interpretation None      MDM   Final diagnoses:  Pain   HDS, NAD in ED. Patient presents with fall yesterday evening in the setting of lightheadedness and "warm sensation" although no true diaphoresis reported. No CP, SOB, palps during this. She is complaining of only bilateral knee pain now. Given H and P as  above, and in the presence of EKG showing NSR, unchanged T-wave abnormality in lateral leads, as well as unchanged ST segment changes in inferior leads. No signs of WPW, brugada, HOCM, long qt. At no time has patient had any chest pain, palpitations. ED vital signs reassuring. Patient's screening labs are unremarkable at this time. Additionally, her plain films of her bilateral knees are unremarkable. On review of records,  patient had a relatively unremarkable cardiac echo last year. Given no ectopy on monitor, normal VS, normal EKG, and pt asymptomatic now, stable for d/c home with close pcp f/u.Pt has PCP that she can f/u closely with.  6:18 PM Pt called and informed to pick up Cipro for UTI. She agrees.   Clinical Impression: 1. Vasovagal near syncope   2. Pain   3. Knee pain, bilateral   4. Contusion   5. UTI (lower urinary tract infection)     Disposition: Discharge  Condition: Good  I have discussed the results, Dx and Tx plan with the pt(& family if present). He/she/they expressed understanding and agree(s) with the plan. Discharge instructions discussed at great length. Strict return precautions discussed and pt &/or family have verbalized understanding of the instructions. No further questions at time of discharge.    New Prescriptions   No medications on file    Follow Up:    Follow up with your PCP in next 1 week regarding your ongoing symptoms.  [place] [address] [id number] [place] [phone]  If symptoms worsen   Pt seen in conjunction with Dr. Evelina Bucy, MD  Kirstie Peri, [facility] Resident - PGY-2      Kirstie Peri, MD 02/16/14 0134  Evelina Bucy, MD 02/17/14 [phone]

## 2014-02-15 NOTE — Discharge Instructions (Signed)
Contusion A contusion is a deep bruise. Contusions are the result of an injury that caused bleeding under the skin. The contusion may turn blue, purple, or yellow. Minor injuries will give you a painless contusion, but more severe contusions may stay painful and swollen for a few weeks.  CAUSES  A contusion is usually caused by a blow, trauma, or direct force to an area of the body. SYMPTOMS   Swelling and redness of the injured area.  Bruising of the injured area.  Tenderness and soreness of the injured area.  Pain. DIAGNOSIS  The diagnosis can be made by taking a history and physical exam. An X-ray, CT scan, or MRI may be needed to determine if there were any associated injuries, such as fractures. TREATMENT  Specific treatment will depend on what area of the body was injured. In general, the best treatment for a contusion is resting, icing, elevating, and applying cold compresses to the injured area. Over-the-counter medicines may also be recommended for pain control. Ask your caregiver what the best treatment is for your contusion. HOME CARE INSTRUCTIONS   Put ice on the injured area.  Put ice in a plastic bag.  Place a towel between your skin and the bag.  Leave the ice on for 15-20 minutes, 3-4 times a day, or as directed by your health care provider.  Only take over-the-counter or prescription medicines for pain, discomfort, or fever as directed by your caregiver. Your caregiver may recommend avoiding anti-inflammatory medicines (aspirin, ibuprofen, and naproxen) for 48 hours because these medicines may increase bruising.  Rest the injured area.  If possible, elevate the injured area to reduce swelling. SEEK IMMEDIATE MEDICAL CARE IF:   You have increased bruising or swelling.  You have pain that is getting worse.  Your swelling or pain is not relieved with medicines. MAKE SURE YOU:   Understand these instructions.  Will watch your condition.  Will get help right  away if you are not doing well or get worse. Document Released: 10/27/2004 Document Revised: 01/22/2013 Document Reviewed: 11/22/2010 [facility] Patient Information 2015 [place], [place]. This information is not intended to replace advice given to you by your health care provider. Make sure you discuss any questions you have with your health care provider.  Near-Syncope Near-syncope (commonly known as near fainting) is sudden weakness, dizziness, or feeling like you might pass out. This can happen when getting up or while standing for a long time. It is caused by a sudden decrease in blood flow to the brain, which can occur for various reasons. Most of the reasons are not serious.  HOME CARE Watch your condition for any changes.  Have someone stay with you until you feel stable.  If you feel like you are going to pass out:  Lie down right away.  Prop your feet up if you can.  Breathe deeply and steadily.  Move only when the feeling has gone away. Most of the time, this feeling lasts only a few minutes. You may feel tired for several hours.  Drink enough fluids to keep your pee (urine) clear or pale yellow.  If you are taking blood pressure or heart medicine, stand up slowly.  Follow up with your doctor as told. GET HELP RIGHT AWAY IF:   You have a severe headache.  You have unusual pain in the chest, belly (abdomen), or back.  You have bleeding from the mouth or butt (rectum), or you have black or tarry poop (stool).  You feel  your heart beat differently than normal, or you have a very fast pulse.  You pass out, or you twitch and shake when you pass out.  You pass out when sitting or lying down.  You feel confused.  You have trouble walking.  You are weak.  You have vision problems. MAKE SURE YOU:   Understand these instructions.  Will watch your condition.  Will get help right away if you are not doing well or get worse. Document Released: 07/06/2007 Document  Revised: 01/22/2013 Document Reviewed: 06/22/2012 [facility] Patient Information 2015 [place], [place]. This information is not intended to replace advice given to you by your health care provider. Make sure you discuss any questions you have with your health care provider.

## 2014-02-15 NOTE — ED Notes (Signed)
CBG: 259 (pt just ate.)

## 2014-02-17 ENCOUNTER — Ambulatory Visit (INDEPENDENT_AMBULATORY_CARE_PROVIDER_SITE_OTHER): Payer: Medicare Other | Admitting: Family Medicine

## 2014-02-17 ENCOUNTER — Ambulatory Visit (HOSPITAL_COMMUNITY)
Admission: RE | Admit: 2014-02-17 | Discharge: 2014-02-17 | Disposition: A | Discharge status: Home / Self Care | Payer: Medicare Other | Source: Ambulatory Visit | Attending: Family Medicine | Admitting: Family Medicine

## 2014-02-17 ENCOUNTER — Encounter: Payer: Self-pay | Admitting: Family Medicine

## 2014-02-17 VITALS — BP 132/88 | HR 75 | Temp 97.5°F | Ht 68.0 in | Wt 273.0 lb

## 2014-02-17 DIAGNOSIS — G4733 Obstructive sleep apnea (adult) (pediatric): Secondary | ICD-10-CM

## 2014-02-17 DIAGNOSIS — R002 Palpitations: Secondary | ICD-10-CM

## 2014-02-17 DIAGNOSIS — R079 Chest pain, unspecified: Secondary | ICD-10-CM

## 2014-02-17 DIAGNOSIS — R05 Cough: Secondary | ICD-10-CM | POA: Diagnosis not present

## 2014-02-17 DIAGNOSIS — R059 Cough, unspecified: Secondary | ICD-10-CM

## 2014-02-17 NOTE — Progress Notes (Signed)
Patient ID: Amando Ishikawa, female   DOB: Sep 18, 1960, 54 y.o.   MRN: [id number]  Tommi Rumps, MD Phone: [phone]  Katy Brickell is a 54 y.o. female who presents today for f/u.  Chest pain: patient notes 2 episodes of chest pain. First yesterday. Started suddenly while sitting at table. Described as lightning sensation in left chest. Notes associated with sensation of palpitations. No dyspnea. Not worsened by exertion. There was some diaphoresis. It went away with no intervention. This recurred this morning. She endorses coughing for the past week with productive cough. No cold symptoms or fever. She notes this pain is the same as 3 years ago when she had a normal stress test. She does not have any symptoms at this time. No chest pain or dyspnea at this time.  She does note having a presyncopal event on Friday that she went to the ED for. She notes she got up from a sitting position and felt warm in the face and light headed. She fell on her knees due to this, though did not have a syncopal event. She did not have any palpitations or chest pain with this. She was evaluated in the ED and this was felt to be related to a vasovagal event.    OSA: patient notes she sleeps well and tolerates her CPAP. She is seen by a pulmonologist in [place] for this. She would like a referral to pulmonology in [place] for this issue.   Patient is a nonsmoker.   ROS: Per HPI   Physical Exam Filed Vitals:   02/17/14 0925  BP: 132/88  Pulse: 75  Temp: 97.5 F (36.4 C)    Gen: Well NAD HEENT: PERRL,  MMM Lungs: CTABL Nl WOB Heart: RRR  No chest wall tenderness. Exts: Non edematous BL  LE, warm and well perfused.    Assessment/Plan: Please see individual problem list.  Tommi Rumps, MD [place] PGY-3

## 2014-02-17 NOTE — Patient Instructions (Signed)

## 2014-02-18 ENCOUNTER — Other Ambulatory Visit: Payer: Self-pay | Admitting: Family Medicine

## 2014-02-18 DIAGNOSIS — E1142 Type 2 diabetes mellitus with diabetic polyneuropathy: Secondary | ICD-10-CM

## 2014-02-18 DIAGNOSIS — R002 Palpitations: Secondary | ICD-10-CM | POA: Insufficient documentation

## 2014-02-18 MED ORDER — BLOOD GLUCOSE TEST VI STRP
ORAL_STRIP | Status: DC
Start: 2014-02-18 — End: 2015-01-06

## 2014-02-18 NOTE — Assessment & Plan Note (Signed)
Referral placed to pulmonology in .

## 2014-02-18 NOTE — Assessment & Plan Note (Signed)
Patient with 2 brief episodes of palpitations and chest pain in the past 2 days. No symptoms at this time. EKG with no arrhythmia noted. Will refer to cardiology for consideration of loop recorder to pick up any events she might have. Given return precautions.

## 2014-02-28 DIAGNOSIS — N200 Calculus of kidney: Secondary | ICD-10-CM | POA: Diagnosis not present

## 2014-03-04 ENCOUNTER — Encounter: Payer: Self-pay | Admitting: Family Medicine

## 2014-03-04 ENCOUNTER — Ambulatory Visit (INDEPENDENT_AMBULATORY_CARE_PROVIDER_SITE_OTHER): Payer: Medicare Other | Admitting: Family Medicine

## 2014-03-04 VITALS — BP 127/85 | HR 90 | Temp 98.2°F | Ht 68.0 in | Wt 271.0 lb

## 2014-03-04 DIAGNOSIS — R002 Palpitations: Secondary | ICD-10-CM

## 2014-03-04 DIAGNOSIS — N200 Calculus of kidney: Secondary | ICD-10-CM | POA: Diagnosis not present

## 2014-03-04 DIAGNOSIS — M1711 Unilateral primary osteoarthritis, right knee: Secondary | ICD-10-CM

## 2014-03-04 DIAGNOSIS — M179 Osteoarthritis of knee, unspecified: Secondary | ICD-10-CM | POA: Diagnosis not present

## 2014-03-04 LAB — POCT URINALYSIS DIPSTICK
BILIRUBIN UA: NEGATIVE
Blood, UA: NEGATIVE
GLUCOSE UA: NEGATIVE
Ketones, UA: NEGATIVE
NITRITE UA: NEGATIVE
PH UA: 7
Protein, UA: NEGATIVE
Spec Grav, UA: 1.015
UROBILINOGEN UA: 0.2

## 2014-03-04 LAB — POCT UA - MICROSCOPIC ONLY

## 2014-03-04 NOTE — Assessment & Plan Note (Signed)
No recurrent episodes. Patient is to keep follow-up with cardiology.

## 2014-03-04 NOTE — Patient Instructions (Signed)
Nice to see you. We will continue to monitor your knees. We may need to consider PT in the future.  We will call with the results of your urine studies.

## 2014-03-04 NOTE — Assessment & Plan Note (Addendum)
Stable. Recently started on flomax. Will obtain UA and UCx per urology request. Continue to follow with urology.

## 2014-03-04 NOTE — Progress Notes (Signed)
Patient ID: Luwanna Brossman, female   DOB: October 02, 1960, 54 y.o.   MRN: [id number]  Tommi Rumps, MD Phone: [phone]  Diamon Reddinger is a 54 y.o. female who presents today for f/u.  Kidney stones: notes she is followed by [facility] urology. She has known stones in her kidneys. States they would like to do a surgery to remove them though she does not want this. They placed her on flomax to help with the pain. She notes some dysuria. She finished cipro last week after starting this 2 weeks ago. Urology requesting UCx.  Knee OA: notes still with pain in knees. Aleve did not help much, though has only taken 2 pills since she saw me last. Has used heating pad. Previously had injections that were only beneficial for one month. Knees hurt most of the day. Has not had PT previously. No prior topical treatments. Saw ortho previously and was told she probably needed a knee replacement. She does not want surgery.  Palpitations: has not had any recurrence. No more episodes of palpitations, chest pain, dyspnea, or pre-syncope. She notes she has follow-up with Cards on 03/11/14.  Patient is a nonsmoker.   ROS: Per HPI   Physical Exam Filed Vitals:   03/04/14 0910  BP: 127/85  Pulse: 90  Temp: 98.2 F (36.8 C)    Gen: Well NAD Lungs: CTABL Nl WOB Heart: RRR  MSK: bilateral knees with some joint line tenderness, no swelling or ligamentous laxity, negative mcmurray sign bilaterally, no swelling or erythema Exts: Non edematous BL  LE, warm and well perfused.    Assessment/Plan: Please see individual problem list.  Tommi Rumps, MD [facility] PGY-3

## 2014-03-04 NOTE — Assessment & Plan Note (Signed)
Patient with OA on XR. Discussed injections though not helpful in past and patient declined. Discussed PT as possibility in the future. Advised on exercise specifically water aerobics to help loose weight and help take pressure of her knees. Advised on capsacin to apply topically. Does not want a surgery so ortho referral is unnecessary at this time. Will continue to monitor.

## 2014-03-05 LAB — URINE CULTURE

## 2014-03-06 ENCOUNTER — Other Ambulatory Visit: Payer: Self-pay | Admitting: Family Medicine

## 2014-03-11 ENCOUNTER — Encounter: Payer: Self-pay | Admitting: Cardiology

## 2014-03-11 ENCOUNTER — Ambulatory Visit (INDEPENDENT_AMBULATORY_CARE_PROVIDER_SITE_OTHER): Payer: Medicare Other | Admitting: Cardiology

## 2014-03-11 VITALS — BP 118/80 | HR 83 | Ht 68.0 in | Wt 274.1 lb

## 2014-03-11 DIAGNOSIS — I10 Essential (primary) hypertension: Secondary | ICD-10-CM | POA: Diagnosis not present

## 2014-03-11 DIAGNOSIS — R011 Cardiac murmur, unspecified: Secondary | ICD-10-CM

## 2014-03-11 DIAGNOSIS — R079 Chest pain, unspecified: Secondary | ICD-10-CM | POA: Insufficient documentation

## 2014-03-11 DIAGNOSIS — R0789 Other chest pain: Secondary | ICD-10-CM | POA: Diagnosis not present

## 2014-03-11 NOTE — Patient Instructions (Signed)
Your physician has requested that you have an echocardiogram. Echocardiography is a painless test that uses sound waves to create images of your heart. It provides your doctor with information about the size and shape of your heart and how well your heart's chambers and valves are working. This procedure takes approximately one hour. There are no restrictions for this procedure.  Your physician has requested that you have a lexiscan myoview. For further information please visit [email or web address]. Please follow instruction sheet, as given. 2 DAY   Follow up as needed

## 2014-03-11 NOTE — Progress Notes (Signed)
Neale Burly Date of Birth:  01-04-1961 [facility] [address] [place], [place]  [address] [phone]        Fax   [phone]   History of Present Illness: This pleasant 54 year old woman is seen by me for the first time today.  She is being seen for evaluation of chest pain and shortness of breath.  Her PCP is Dr. Caryl Bis at the [facility]. The patient gives a history of having had intermittent left-sided chest discomfort for the past several months.  The episodes can occur at rest or with activity.  She has not had any radiation to the left arm.  She has been experiencing increased shortness of breath walking up hills or steps.  She has also had some intermittent diaphoresis.  She has been having paroxysmal nocturnal dyspnea.  She wakes up short of breath.  She sleeps on one pillow.  2 months ago she had an episode of near syncope after feeling like she was having a hot flash in the car and as she got out of the car she collapsed onto the ground.  She went to the emergency room the following day and no specific cause of her problem was found. Recently her PCP noted a heart murmur.  The patient had an echocardiogram 04/05/13 which showed normal ejection fraction 55-60% and grade 1 diastolic dysfunction but on that study no significant valve abnormalities were seen. The patient has had hypertension for about 15 years.  She has been diabetic for about 6 years.  She has a history of hypercholesterolemia.  She has a history of obstructive sleep apnea and uses a CPAP machine. Her family history reveals that her father died at age 20 of heart problems.  Her mother died at 85 of a leaking heart valve.  She has one 75 year old brother that had bypass surgery but he also had a problem with drugs.  Current Outpatient Prescriptions  Medication Sig Dispense Refill  . amLODipine (NORVASC) 10 MG tablet Take 1 tablet (10 mg total) by mouth daily. 90  tablet 3  . fluconazole (DIFLUCAN) 150 MG tablet Take 150 mg by mouth once as needed (yeast infection).     . gabapentin (NEURONTIN) 100 MG capsule Take 1 capsule (100 mg total) by mouth 3 (three) times daily. (Patient taking differently: Take 100 mg by mouth 3 (three) times daily as needed (nerve pain). ) 90 capsule 3  . glimepiride (AMARYL) 2 MG tablet TAKE TWO TABLETS BY MOUTH ONCE DAILY WITH BREAKFAST 60 tablet 5  . Glucose Blood (BLOOD GLUCOSE TEST STRIPS) STRP Use as directed to check blood sugar once daily 100 each 12  . hydrochlorothiazide (HYDRODIURIL) 25 MG tablet TAKE ONE TABLET BY MOUTH ONCE DAILY (Patient taking differently: Take 25 mg by mouth daily. ) 90 tablet 3  . Liraglutide (VICTOZA) 18 MG/3ML SOPN Inject 1.2 mg into the skin daily. (Patient taking differently: Inject 1.2 mg into the skin at bedtime. ) 12 mL 5  . lisinopril (PRINIVIL,ZESTRIL) 40 MG tablet Take 1 tablet (40 mg total) by mouth daily. 90 tablet 3  . metFORMIN (GLUMETZA) 1000 MG (MOD) 24 hr tablet Take 1,000 mg by mouth 2 (two) times daily with a meal.     . metoprolol (TOPROL-XL) 200 MG 24 hr tablet Take 1 tablet (200 mg total) by mouth daily. 90 tablet 3  . naproxen (NAPROSYN) 500 MG tablet Take 1 tablet (500 mg total) by mouth 2 (two) times  daily as needed for mild pain (with food). 30 tablet 0  . omeprazole (PRILOSEC) 40 MG capsule Take 1 capsule (40 mg total) by mouth daily. 90 capsule 3  . Polyvinyl Alcohol-Povidone (REFRESH OP) Place 1 drop into both eyes daily as needed (dry eyes).    . potassium citrate (UROCIT-K 10) 10 MEQ (1080 MG) SR tablet Take 2 tablets (20 mEq total) by mouth 2 (two) times daily. 120 tablet 1  . simvastatin (ZOCOR) 40 MG tablet Take 1 tablet (40 mg total) by mouth every evening. (Patient taking differently: Take 40 mg by mouth at bedtime. ) 90 tablet 3  . SUMAtriptan (IMITREX) 50 MG tablet Take 1 tablet (50 mg total) by mouth once. May repeat in 2 hours if headache persists or recurs.  (Patient taking differently: Take 50 mg by mouth every 2 (two) hours as needed for migraine. May repeat in 2 hours if headache persists or recurs.) 10 tablet 0  . tamsulosin (FLOMAX) 0.4 MG CAPS capsule Take 0.4 mg by mouth 2 (two) times daily.     . Tdap (BOOSTRIX) 5-2.5-18.5 LF-MCG/0.5 injection Inject 0.5 mLs into the muscle once. 0.5 mL 0  . ULTICARE MINI PEN NEEDLES 31G X 6 MM MISC USE TWICE DAILY AS NEEDED FOR DOSING OF BYETTA. 100 each 3   No current facility-administered medications for this visit.    Allergies  Allergen Reactions  . Aspirin Hives  . Ivp Dye [Iodinated Diagnostic Agents] Hives  . Shellfish-Derived Products Hives and Shortness Of Breath    Patient Active Problem List   Diagnosis Date Noted  . Chest pain of uncertain etiology 16/11/9602  . Palpitations 02/18/2014  . Knee osteoarthritis 01/17/2014  . Left hip pain 01/17/2014  . Nevus 01/17/2014  . Heart murmur, systolic 54/10/8117  . Rash 09/17/2013  . Edema 03/31/2013  . Migraine   . Kidney stones   . Urge incontinence 09/30/2011  . 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 Obstructive sleep apnea 09/27/2010  . Diabetic neuropathy 02/05/2010  . HYPOKALEMIA 10/01/2009  . NEPHROLITHIASIS, RECURRENT 09/25/2009  . PULMONARY HYPERTENSION, MILD 02/11/2009  . Pain in joint, lower leg 08/07/2008  . OBESITY 05/05/2008  . DM (diabetes mellitus), type 2 03/31/2008  . HYPERLIPIDEMIA 03/31/2008  . HTN (hypertension) 03/31/2008    History  Smoking status  . Never Smoker   Smokeless tobacco  . Never Used    History  Alcohol Use No    Family History  Problem Relation Age of Onset  . Coronary artery disease Brother     CABG age 51  . Diabetes Mother   . Hypertension Mother   . Kidney disease Mother     Review of Systems: Constitutional: no fever chills diaphoresis or fatigue or change in weight.  Head and neck: no hearing loss, no epistaxis, no photophobia or visual disturbance. Respiratory: Positive  for dyspnea on exertion Cardiovascular: Positive for chest pain Gastrointestinal: No abdominal distention, no abdominal pain, no change in bowel habits hematochezia or melena. Genitourinary: No dysuria, no frequency, no urgency, no nocturia. Musculoskeletal:No arthralgias, no back pain, no gait disturbance or myalgias. Neurological: No dizziness, no headaches, no numbness, no seizures, no syncope, no weakness, no tremors. Hematologic: No lymphadenopathy, no easy bruising. Psychiatric: No confusion, no hallucinations, no sleep disturbance.   Wt Readings from Last 3 Encounters:  03/11/14 274 lb 1.9 oz (124.34 kg)  03/04/14 271 lb (122.925 kg)  02/17/14 273 lb (123.832 kg)    Physical Exam: Filed Vitals:   03/11/14 1423  BP: 118/80  Pulse: 83  The patient appears to be in no distress.  Morbid obesity.  Head and neck exam reveals that the pupils are equal and reactive.  The extraocular movements are full.  There is no scleral icterus.  Mouth and pharynx are benign.  No lymphadenopathy.  No carotid bruits.  The jugular venous pressure is normal.  Thyroid is not enlarged or tender.  Chest is clear to percussion and auscultation.  No rales or rhonchi.  Expansion of the chest is symmetrical.  Heart reveals no abnormal lift or heave.  First and second heart sounds are normal.  There is no gallop rub or click.  There is a grade 2/6 systolic murmur at the base.  The abdomen is soft and nontender.  Bowel sounds are normoactive.  There is no hepatosplenomegaly or mass.  There are no abdominal bruits.  Extremities reveal no phlebitis.  There is mild edema Pedal pulses are good.  There is no cyanosis or clubbing.  Neurologic exam is normal strength and no lateralizing weakness.  No sensory deficits.  Integument reveals no rash  EKG shows normal sinus rhythm and nonspecific ST-T wave abnormalities in the inferolateral leads.  Since the previous tracing of 02/17/14, no significant  change.  Assessment / Plan: 1.  Chest pain uncertain etiology 2.  Systolic heart murmur at aortic area, rule out aortic stenosis 3.  Dyspnea on exertion 4.  Essential hypertension 5.  Diabetes mellitus 6.  Morbid obesity 7.  Hypercholesterolemia 8.  Obstructive sleep apnea, uses CPAP machine  Disposition: We will have the patient return for a 2-D echo to evaluate her systolic murmur at the base.  We will have her return for a 2 day Lexiscan Myoview stress test to evaluate her chest pain further. Many thanks for the opportunity to see this pleasant woman with you.  We'll be in touch regarding the results of her studies.

## 2014-03-14 ENCOUNTER — Encounter: Payer: Self-pay | Admitting: Family Medicine

## 2014-03-20 ENCOUNTER — Ambulatory Visit (HOSPITAL_BASED_OUTPATIENT_CLINIC_OR_DEPARTMENT_OTHER): Payer: Medicare Other | Admitting: Radiology

## 2014-03-20 ENCOUNTER — Ambulatory Visit (HOSPITAL_COMMUNITY): Payer: Medicare Other | Attending: Cardiology

## 2014-03-20 DIAGNOSIS — R55 Syncope and collapse: Secondary | ICD-10-CM | POA: Insufficient documentation

## 2014-03-20 DIAGNOSIS — R011 Cardiac murmur, unspecified: Secondary | ICD-10-CM

## 2014-03-20 DIAGNOSIS — R079 Chest pain, unspecified: Secondary | ICD-10-CM

## 2014-03-20 DIAGNOSIS — I272 Other secondary pulmonary hypertension: Secondary | ICD-10-CM | POA: Insufficient documentation

## 2014-03-20 DIAGNOSIS — R0789 Other chest pain: Secondary | ICD-10-CM | POA: Diagnosis not present

## 2014-03-20 DIAGNOSIS — R0602 Shortness of breath: Secondary | ICD-10-CM | POA: Insufficient documentation

## 2014-03-20 DIAGNOSIS — E119 Type 2 diabetes mellitus without complications: Secondary | ICD-10-CM | POA: Diagnosis not present

## 2014-03-20 MED ORDER — TECHNETIUM TC 99M SESTAMIBI GENERIC - CARDIOLITE
33.0000 | Freq: Once | INTRAVENOUS | Status: AC | PRN
  Administered 2014-03-20: 33 via INTRAVENOUS

## 2014-03-20 MED ORDER — REGADENOSON 0.4 MG/5ML IV SOLN
0.4000 mg | Freq: Once | INTRAVENOUS | Status: AC
  Administered 2014-03-20: 0.4 mg via INTRAVENOUS

## 2014-03-20 NOTE — Progress Notes (Signed)
[facility] [address] [place], [place] [address] [phone]    Cardiology Nuclear Med Study  Kathryn Crosby is a 54 y.o. female     MRN : [id number]     DOB: 27-Mar-1960  Procedure Date: 03/20/2014  Nuclear Med Background Indication for Stress Test:  Evaluation for Ischemia History:  '13 MPI: EF: 62% NL Cardiac Risk Factors: Hypertension, NIDDM and Pulmonary HTN  Symptoms:  Chest Pain, DOE, Near Syncope and SOB   Nuclear Pre-Procedure Caffeine/Decaff Intake:  None NPO After: 8:00pm   Lungs:  clear O2 Sat: 96% on room air. IV 0.9% NS with Angio Cath:  22g  IV Site: R Antecubital  IV Started by:  Perrin Maltese, EMT-P  Chest Size (in):  42 Cup Size: DD  Height: 5\' 8"  (1.727 m)  Weight:  274 lb (124.286 kg)  BMI:  Body mass index is 41.67 kg/(m^2). Tech Comments:  n/a    Nuclear Med Study 1 or 2 day study: 2 day  Stress Test Type:  Carlton Adam  Reading MD: n/a  Order Authorizing Provider:  T.Brackbill MD  Resting Radionuclide: Technetium 2m Sestamibi  Resting Radionuclide Dose: 33.0 mCi  On      03-24-14  Stress Radionuclide:  Technetium 20m Sestamibi  Stress Radionuclide Dose: 33.0 mCi  On        03-20-14          Stress Protocol Rest HR: 74 Stress HR: 92  Rest BP: 128/40 Stress BP: 142/103  Exercise Time (min): n/a METS: n/a   Predicted Max HR: 165 bpm % Max HR: 55.76 bpm Rate Pressure Product: 13064   Dose of Adenosine (mg):  n/a Dose of Lexiscan: 0.4 mg  Dose of Atropine (mg): n/a Dose of Dobutamine: n/a mcg/kg/min (at max HR)  Stress Test Technologist: Perrin Maltese, EMT-P  Nuclear Technologist:  Earl Many, CNMT     Rest Procedure:  Myocardial perfusion imaging was performed at rest 45 minutes following the intravenous administration of Technetium 4m Sestamibi. Rest ECG: NSR with non-specific ST-T wave changes  Stress Procedure:  The patient received IV Lexiscan 0.4 mg over 15-seconds.  Technetium 85m Sestamibi  injected at 30-seconds. This patient had sob with the Lexiscan injection. Quantitative spect images were obtained after a 45 minute delay. Stress ECG: No significant ST segment change suggestive of ischemia.  QPS Raw Data Images:  Acquisition technically good; normal left ventricular size. Stress Images:  There is decreased uptake in the apex. Rest Images:  There is decreased uptake in the apex, less prominent compared to the stress images. Subtraction (SDS):  These findings are consistent with apical thinning and mild ischemia. Transient Ischemic Dilatation (Normal <1.22):  1.12 Lung/Heart Ratio (Normal <0.45):  0.26  Quantitative Gated Spect Images QGS EDV:  120 ml QGS ESV:  51 ml  Impression Exercise Capacity:  Lexiscan with no exercise. BP Response:  Normal blood pressure response. Clinical Symptoms:  There is dyspnea. ECG Impression:  No significant ST segment change suggestive of ischemia. Comparison with Prior Nuclear Study: Compared to 02/16/11, ischemia is new.   Overall Impression:  Low risk stress nuclear study with a small, severe, partially reversible apical defect consistent with apical thinning and mild apical ischemia.  LV Ejection Fraction: 58%.  LV Wall Motion:  NL LV Function; NL Wall Motion  Kirk Ruths

## 2014-03-20 NOTE — Progress Notes (Signed)
2D Echo completed. 03/20/2014 

## 2014-03-24 ENCOUNTER — Ambulatory Visit (HOSPITAL_COMMUNITY): Payer: Medicare Other | Attending: Cardiovascular Disease

## 2014-03-24 ENCOUNTER — Telehealth: Payer: Self-pay | Admitting: Cardiology

## 2014-03-24 DIAGNOSIS — R0989 Other specified symptoms and signs involving the circulatory and respiratory systems: Secondary | ICD-10-CM

## 2014-03-24 MED ORDER — TECHNETIUM TC 99M SESTAMIBI GENERIC - CARDIOLITE
30.0000 | Freq: Once | INTRAVENOUS | Status: AC | PRN
  Administered 2014-03-24: 30 via INTRAVENOUS

## 2014-03-24 NOTE — Telephone Encounter (Signed)
Follow up      Returning Kathryn Crosby's call

## 2014-03-24 NOTE — Telephone Encounter (Signed)
New Message    Patient is returning call to [place]. Please give a call back

## 2014-03-24 NOTE — Telephone Encounter (Signed)
Notified of echo results.

## 2014-03-28 ENCOUNTER — Ambulatory Visit (INDEPENDENT_AMBULATORY_CARE_PROVIDER_SITE_OTHER): Payer: Medicare Other | Admitting: Pulmonary Disease

## 2014-03-28 ENCOUNTER — Encounter: Payer: Self-pay | Admitting: Pulmonary Disease

## 2014-03-28 VITALS — BP 122/70 | HR 77 | Temp 97.1°F | Ht 68.0 in | Wt 270.6 lb

## 2014-03-28 DIAGNOSIS — G4733 Obstructive sleep apnea (adult) (pediatric): Secondary | ICD-10-CM | POA: Diagnosis not present

## 2014-03-28 NOTE — Patient Instructions (Signed)
Will have your machine checked to make sure it is putting out adequate pressure. Will get download off your device for last 3 mos to make sure you are not having persistent apnea Work on weight loss Will call you once we have the above information.

## 2014-03-28 NOTE — Assessment & Plan Note (Signed)
The patient has a history of severe obstructive sleep apnea dating back to 2012, extremely well with C Pap initially. The last 4 months she has seen frequent awakenings, nonrestorative sleep, and a return of her early morning headaches. She also has had significant daytime sleepiness which is new for her. At this point, we need to make sure that her machine is working properly, and that her set pressure is adequately controlling her degree of sleep apnea. Will also be able to look at mask leak data, although she feels this is not an issue and has kept up with mask cushion changes. If her downloaded shows excellent control of her AHI, then I would consider whether chronic pain related to her neuropathy is contributing to her sleep disruption.

## 2014-03-28 NOTE — Progress Notes (Signed)
Subjective:    Patient ID: Kathryn Crosby, female    DOB: 1960-12-13, 54 y.o.   MRN: [id number]  HPI The patient is a 54 year old female who I've been asked to see for ongoing sleep disruption and daytime sleepiness. She has a history of obstructive sleep apnea dating back to 2012, with severe OSA noted. She underwent a titration study with an optimal pressure of 14 cm of water. She did very well with C Pap until approximately 4 months ago, and then began to develop frequent awakenings and nonrestorative sleep. She also started having early morning headaches like before her treatment with C Pap. The patient currently uses a full face mask, and has kept up with her cushion changes and supplies. She feels the machine is working properly, but is concerned that it is not putting out enough pressure. She has significant sleepiness during the day and her Epworth score is 14. No one has ever mentioned kicking during the night, and she denies any symptoms of the restless leg syndrome. She does have a history of neuropathy, and admits to having chronic pain which awakens her during the night.   Sleep Questionnaire What time do you typically go to bed?( Between what hours) 10-11p 10-11p at 1515 on 03/28/14 by Inge Rise, CMA How long does it take you to fall asleep? 1 hr 1 hr at 1515 on 03/28/14 by Inge Rise, CMA How many times during the night do you wake up? 4 4 at 1515 on 03/28/14 by Inge Rise, CMA What time do you get out of bed to start your day? 0530 0530 at 1515 on 03/28/14 by Inge Rise, CMA Do you drive or operate heavy machinery in your occupation? No No at 1515 on 03/28/14 by Inge Rise, CMA How much has your weight changed (up or down) over the past two years? (In pounds) 3 lb (1.361 kg) 3 lb (1.361 kg) at 1515 on 03/28/14 by Inge Rise, CMA Have you ever had a sleep study before? Yes Yes at 1515 on 03/28/14 by Inge Rise, CMA If yes, [facility] of study?  [facility] at 1515 on 03/28/14 by Inge Rise, CMA If yes, date of study? 2012 2012 at 1515 on 03/28/14 by Inge Rise, CMA Do you currently use CPAP? Yes Yes at 1515 on 03/28/14 by Inge Rise, CMA If so, what pressure? not sure not sure at 1515 on 03/28/14 by Inge Rise, CMA Do you wear oxygen at any time? No No at 1515 on 03/28/14 by Inge Rise, CMA   Review of Systems  Constitutional: Negative for fever and unexpected weight change.  HENT: Negative for congestion, dental problem, ear pain, nosebleeds, postnasal drip, rhinorrhea, sinus pressure, sneezing, sore throat and trouble swallowing.   Eyes: Negative for redness and itching.  Respiratory: Negative for cough, chest tightness, shortness of breath and wheezing.   Cardiovascular: Negative for palpitations and leg swelling.  Gastrointestinal: Negative for nausea and vomiting.  Genitourinary: Negative for dysuria.  Musculoskeletal: Negative for joint swelling.  Skin: Negative for rash.  Neurological: Negative for headaches.  Hematological: Does not bruise/bleed easily.  Psychiatric/Behavioral: Negative for dysphoric mood. The patient is not nervous/anxious.        Objective:   Physical Exam Constitutional:  Obese female, no acute distress  HENT:  Nares patent without discharge  Oropharynx without exudate, palate and uvula are trimmed  Eyes:  Perrla, eomi, no scleral icterus  Neck:  No  JVD, no TMG  Cardiovascular:  Normal rate, regular rhythm, no rubs or gallops.  2/6 sem        Intact distal pulses  Pulmonary :  Normal breath sounds, no stridor or respiratory distress   No rales, rhonchi, or wheezing  Abdominal:  Soft, nondistended, bowel sounds present.  No tenderness noted.   Musculoskeletal:  2+ lower extremity edema noted.  Lymph Nodes:  No cervical lymphadenopathy noted  Skin:  No cyanosis noted  Neurologic:  Alert, appropriate, moves all 4 extremities without obvious deficit.          Assessment & Plan:

## 2014-05-06 ENCOUNTER — Ambulatory Visit (INDEPENDENT_AMBULATORY_CARE_PROVIDER_SITE_OTHER): Payer: Medicare Other | Admitting: Family Medicine

## 2014-05-06 VITALS — BP 136/84 | HR 72 | Temp 98.1°F | Ht 68.0 in | Wt 277.4 lb

## 2014-05-06 DIAGNOSIS — J0100 Acute maxillary sinusitis, unspecified: Secondary | ICD-10-CM | POA: Diagnosis present

## 2014-05-06 DIAGNOSIS — J019 Acute sinusitis, unspecified: Secondary | ICD-10-CM | POA: Insufficient documentation

## 2014-05-06 DIAGNOSIS — I10 Essential (primary) hypertension: Secondary | ICD-10-CM | POA: Diagnosis not present

## 2014-05-06 LAB — CBC WITH DIFFERENTIAL/PLATELET
BASOS ABS: 0 10*3/uL (ref 0.0–0.1)
BASOS PCT: 0 % (ref 0–1)
EOS ABS: 0.2 10*3/uL (ref 0.0–0.7)
EOS PCT: 2 % (ref 0–5)
HEMATOCRIT: 38.2 % (ref 36.0–46.0)
Hemoglobin: 12.7 g/dL (ref 12.0–15.0)
Lymphocytes Relative: 44 % (ref 12–46)
Lymphs Abs: 4.4 10*3/uL — ABNORMAL HIGH (ref 0.7–4.0)
MCH: 27 pg (ref 26.0–34.0)
MCHC: 33.2 g/dL (ref 30.0–36.0)
MCV: 81.3 fL (ref 78.0–100.0)
MONO ABS: 1 10*3/uL (ref 0.1–1.0)
MPV: 10.3 fL (ref 8.6–12.4)
Monocytes Relative: 10 % (ref 3–12)
Neutro Abs: 4.4 10*3/uL (ref 1.7–7.7)
Neutrophils Relative %: 44 % (ref 43–77)
PLATELETS: 385 10*3/uL (ref 150–400)
RBC: 4.7 MIL/uL (ref 3.87–5.11)
RDW: 15.5 % (ref 11.5–15.5)
WBC: 10 10*3/uL (ref 4.0–10.5)

## 2014-05-06 LAB — BASIC METABOLIC PANEL
BUN: 11 mg/dL (ref 6–23)
CHLORIDE: 102 meq/L (ref 96–112)
CO2: 27 meq/L (ref 19–32)
Calcium: 8.6 mg/dL (ref 8.4–10.5)
Creat: 0.76 mg/dL (ref 0.50–1.10)
Glucose, Bld: 59 mg/dL — ABNORMAL LOW (ref 70–99)
Potassium: 3.4 mEq/L — ABNORMAL LOW (ref 3.5–5.3)
SODIUM: 139 meq/L (ref 135–145)

## 2014-05-06 MED ORDER — FUROSEMIDE 20 MG PO TABS
20.0000 mg | ORAL_TABLET | Freq: Every day | ORAL | Status: DC
Start: 2014-05-06 — End: 2014-09-05

## 2014-05-06 MED ORDER — AMOXICILLIN-POT CLAVULANATE 875-125 MG PO TABS: 1.0000 | ORAL_TABLET | Freq: Two times a day (BID) | ORAL | Status: DC

## 2014-05-06 NOTE — Progress Notes (Signed)
Patient ID: Kathryn Crosby, female   DOB: November 02, 1960, 54 y.o.   MRN: [id number]   HPI  Patient presents today for sinus pressure and leg edema   sinus pressure,  Cold symptoms Patient explains for the past 2 weeks she's had persistent congestion, red area, and slowly worsening sinus pressure.  She had fevers initially but they've resolved She reports good /normal by mouth intake  She denies current fever, chills, chest pain, dyspnea, or cough.   leg edema Chronic leg edema, not helped much by her compression stockings, she admits that she doesn't wear them 100% of the time.  stable on HCTZ for quite some time  States that it's been worse for the last month or so without any  Obvious etiology  States she has mild dyspnea with exertion , had recent Lexi scan and echo without severe abnormality  Has grade 1 diastolic dysfunction and mild pulmonary hypertension on recent echo , normal EF  also recent labs are normal  Smoking status noted ROS: Per HPI  Objective: BP 136/84 mmHg  Pulse 72  Temp(Src) 98.1 F (36.7 C) (Oral)  Ht 5\' 8"  (1.727 m)  Wt 277 lb 6.4 oz (125.828 kg)  BMI 42.19 kg/m2 Gen: NAD, alert, cooperative with exam HEENT: NCAT,  Left turbinate very swollen and boggy,  TMs WNL BL, oropharynx clear, tonsils and uvula surgically absent CV: RRR, good S1/S2, no murmur Resp: CTABL, no wheezes, non-labored Ext:  3+ pitting edema with hemosiderin staining bilaterally Neuro: Alert and oriented,  Conversational, normal gait  Assessment and plan:  Acute sinusitis Symptoms for 14 days with severe sinus pressure Tx with augmentin    HTN (hypertension) Well controlled on several meds Changed HCTZ to lasix to try and help with edema Discussed it may return after initial diuresis Labs, F/u PCP in 1 month    Orders Placed This Encounter  Procedures  . Basic Metabolic Panel  . CBC with Differential    Meds ordered this encounter  Medications  . furosemide (LASIX) 20  MG tablet    Sig: Take 1 tablet (20 mg total) by mouth daily.    Dispense:  30 tablet    Refill:  3  . amoxicillin-clavulanate (AUGMENTIN) 875-125 MG per tablet    Sig: Take 1 tablet by mouth 2 (two) times daily.    Dispense:  20 tablet    Refill:  0

## 2014-05-06 NOTE — Patient Instructions (Signed)
Great to meet you!  Lets get you a follow up with Dr. Josephina Gip next month  Use the Augmentin for your sinus infection  For your edema: Stop HCTZ Start Lasix (furosemide)  Sinusitis Sinusitis is redness, soreness, and inflammation of the paranasal sinuses. Paranasal sinuses are air pockets within the bones of your face (beneath the eyes, the middle of the forehead, or above the eyes). In healthy paranasal sinuses, mucus is able to drain out, and air is able to circulate through them by way of your nose. However, when your paranasal sinuses are inflamed, mucus and air can become trapped. This can allow bacteria and other germs to grow and cause infection. Sinusitis can develop quickly and last only a short time (acute) or continue over a long period (chronic). Sinusitis that lasts for more than 12 weeks is considered chronic.  CAUSES  Causes of sinusitis include:  Allergies.  Structural abnormalities, such as displacement of the cartilage that separates your nostrils (deviated septum), which can decrease the air flow through your nose and sinuses and affect sinus drainage.  Functional abnormalities, such as when the small hairs (cilia) that line your sinuses and help remove mucus do not work properly or are not present. SIGNS AND SYMPTOMS  Symptoms of acute and chronic sinusitis are the same. The primary symptoms are pain and pressure around the affected sinuses. Other symptoms include:  Upper toothache.  Earache.  Headache.  Bad breath.  Decreased sense of smell and taste.  A cough, which worsens when you are lying flat.  Fatigue.  Fever.  Thick drainage from your nose, which often is green and may contain pus (purulent).  Swelling and warmth over the affected sinuses. DIAGNOSIS  Your health care provider will perform a physical exam. During the exam, your health care provider may:  Look in your nose for signs of abnormal growths in your nostrils (nasal polyps).  Tap  over the affected sinus to check for signs of infection.  View the inside of your sinuses (endoscopy) using an imaging device that has a light attached (endoscope). If your health care provider suspects that you have chronic sinusitis, one or more of the following tests may be recommended:  Allergy tests.  Nasal culture. A sample of mucus is taken from your nose, sent to a lab, and screened for bacteria.  Nasal cytology. A sample of mucus is taken from your nose and examined by your health care provider to determine if your sinusitis is related to an allergy. TREATMENT  Most cases of acute sinusitis are related to a viral infection and will resolve on their own within 10 days. Sometimes medicines are prescribed to help relieve symptoms (pain medicine, decongestants, nasal steroid sprays, or saline sprays).  However, for sinusitis related to a bacterial infection, your health care provider will prescribe antibiotic medicines. These are medicines that will help kill the bacteria causing the infection.  Rarely, sinusitis is caused by a fungal infection. In theses cases, your health care provider will prescribe antifungal medicine. For some cases of chronic sinusitis, surgery is needed. Generally, these are cases in which sinusitis recurs more than 3 times per year, despite other treatments. HOME CARE INSTRUCTIONS   Drink plenty of water. Water helps thin the mucus so your sinuses can drain more easily.  Use a humidifier.  Inhale steam 3 to 4 times a day (for example, sit in the bathroom with the shower running).  Apply a warm, moist washcloth to your face 3 to 4 times  a day, or as directed by your health care provider.  Use saline nasal sprays to help moisten and clean your sinuses.  Take medicines only as directed by your health care provider.  If you were prescribed either an antibiotic or antifungal medicine, finish it all even if you start to feel better. SEEK IMMEDIATE MEDICAL CARE  IF:  You have increasing pain or severe headaches.  You have nausea, vomiting, or drowsiness.  You have swelling around your face.  You have vision problems.  You have a stiff neck.  You have difficulty breathing. MAKE SURE YOU:   Understand these instructions.  Will watch your condition.  Will get help right away if you are not doing well or get worse. Document Released: 01/17/2005 Document Revised: 06/03/2013 Document Reviewed: 02/01/2011 [facility] Patient Information 2015 [place], [place]. This information is not intended to replace advice given to you by your health care provider. Make sure you discuss any questions you have with your health care provider.

## 2014-05-06 NOTE — Assessment & Plan Note (Signed)
Symptoms for 14 days with severe sinus pressure Tx with augmentin

## 2014-05-06 NOTE — Assessment & Plan Note (Signed)
Well controlled on several meds Changed HCTZ to lasix to try and help with edema Discussed it may return after initial diuresis Labs, F/u PCP in 1 month

## 2014-05-07 ENCOUNTER — Encounter: Payer: Self-pay | Admitting: Family Medicine

## 2014-05-07 ENCOUNTER — Telehealth: Payer: Self-pay | Admitting: Family Medicine

## 2014-05-07 NOTE — Telephone Encounter (Signed)
Called and discussed labs. Low K and I started lasix, so wanted to replace K. However she is on Potassium citrate for renal stones and is currently tolerating it well.   She is taking it twice daily.  Considering it is mild and she is on K will just follow up as usual and continue current therapy.   Laroy Apple, MD [place] Resident, PGY-3 05/07/2014, 5:17 PM

## 2014-05-20 DIAGNOSIS — Z23 Encounter for immunization: Secondary | ICD-10-CM | POA: Diagnosis not present

## 2014-06-06 DIAGNOSIS — E119 Type 2 diabetes mellitus without complications: Secondary | ICD-10-CM | POA: Diagnosis not present

## 2014-06-06 DIAGNOSIS — H2513 Age-related nuclear cataract, bilateral: Secondary | ICD-10-CM | POA: Diagnosis not present

## 2014-06-06 DIAGNOSIS — H04411 Chronic dacryocystitis of right lacrimal passage: Secondary | ICD-10-CM | POA: Diagnosis not present

## 2014-06-06 DIAGNOSIS — G4730 Sleep apnea, unspecified: Secondary | ICD-10-CM | POA: Diagnosis not present

## 2014-06-06 DIAGNOSIS — H0289 Other specified disorders of eyelid: Secondary | ICD-10-CM | POA: Diagnosis not present

## 2014-06-06 LAB — HM DIABETES EYE EXAM

## 2014-06-11 ENCOUNTER — Ambulatory Visit: Payer: Medicare Other | Admitting: Family Medicine

## 2014-06-24 ENCOUNTER — Encounter: Payer: Self-pay | Admitting: *Deleted

## 2014-06-24 NOTE — Progress Notes (Signed)
Prior Authorization received from [place] for [place].  PA form placed in provider box for completion. Derl Barrow, RN

## 2014-06-27 NOTE — Progress Notes (Signed)
PA completed and returned to [employer].

## 2014-07-01 NOTE — Progress Notes (Signed)
PA form faxed to [facility] for review. Violett Hobbs L, RN  

## 2014-07-02 ENCOUNTER — Ambulatory Visit (INDEPENDENT_AMBULATORY_CARE_PROVIDER_SITE_OTHER): Payer: Medicare Other | Admitting: Family Medicine

## 2014-07-02 ENCOUNTER — Telehealth: Payer: Self-pay | Admitting: Family Medicine

## 2014-07-02 ENCOUNTER — Encounter: Payer: Self-pay | Admitting: Family Medicine

## 2014-07-02 VITALS — BP 132/84 | HR 66 | Temp 98.1°F | Wt 270.0 lb

## 2014-07-02 DIAGNOSIS — I10 Essential (primary) hypertension: Secondary | ICD-10-CM

## 2014-07-02 DIAGNOSIS — R300 Dysuria: Secondary | ICD-10-CM | POA: Diagnosis not present

## 2014-07-02 DIAGNOSIS — B379 Candidiasis, unspecified: Secondary | ICD-10-CM | POA: Diagnosis not present

## 2014-07-02 DIAGNOSIS — N898 Other specified noninflammatory disorders of vagina: Secondary | ICD-10-CM

## 2014-07-02 DIAGNOSIS — E876 Hypokalemia: Secondary | ICD-10-CM

## 2014-07-02 DIAGNOSIS — E1142 Type 2 diabetes mellitus with diabetic polyneuropathy: Secondary | ICD-10-CM

## 2014-07-02 LAB — POCT WET PREP (WET MOUNT)
CLUE CELLS WET PREP WHIFF POC: NEGATIVE
WBC, Wet Prep HPF POC: >20

## 2014-07-02 LAB — POCT URINALYSIS DIPSTICK
BILIRUBIN UA: NEGATIVE
Glucose, UA: NEGATIVE
Ketones, UA: NEGATIVE
NITRITE UA: NEGATIVE
Protein, UA: NEGATIVE
SPEC GRAV UA: 1.015
UROBILINOGEN UA: 0.2
pH, UA: 6.5

## 2014-07-02 LAB — BASIC METABOLIC PANEL
BUN: 11 mg/dL (ref 6–23)
CO2: 29 meq/L (ref 19–32)
CREATININE: 0.73 mg/dL (ref 0.50–1.10)
Calcium: 8.3 mg/dL — ABNORMAL LOW (ref 8.4–10.5)
Chloride: 106 mEq/L (ref 96–112)
GLUCOSE: 213 mg/dL — AB (ref 70–99)
Potassium: 3.2 mEq/L — ABNORMAL LOW (ref 3.5–5.3)
SODIUM: 143 meq/L (ref 135–145)

## 2014-07-02 LAB — POCT GLYCOSYLATED HEMOGLOBIN (HGB A1C): Hemoglobin A1C: 8.6

## 2014-07-02 LAB — POCT UA - MICROSCOPIC ONLY
Epithelial cells, urine per micros: >20
WBC, Ur, HPF, POC: >20

## 2014-07-02 MED ORDER — LIRAGLUTIDE 18 MG/3ML ~~LOC~~ SOPN: 1.8000 mg | PEN_INJECTOR | Freq: Every day | SUBCUTANEOUS | Status: DC

## 2014-07-02 MED ORDER — FLUCONAZOLE 150 MG PO TABS: 150.0000 mg | ORAL_TABLET | ORAL | Status: DC

## 2014-07-02 MED ORDER — POTASSIUM CHLORIDE CRYS ER 20 MEQ PO TBCR: 40.0000 meq | EXTENDED_RELEASE_TABLET | Freq: Once | ORAL | Status: DC

## 2014-07-02 NOTE — Assessment & Plan Note (Signed)
A1c worsened. Discussed diet and exercise. Will increase victoza to 1.8 mg daily. Continue metformin and amaryl. F/u one month for this issue.

## 2014-07-02 NOTE — Patient Instructions (Signed)
Nice to see you. We will increase your victoza to 1.8 mg daily. Please start to monitor your diet.  We will send your urine for culture. We will call with results of your wet prep.

## 2014-07-02 NOTE — Assessment & Plan Note (Signed)
Discussed goal of less than 130/80 given her DM. BMET with hypokalemia which could be due to the recent starting of lasix. Attempted to call patient to discuss this after she left, though had to leave a message. Will need to d/c lasix. Consider restarting HCTZ. Will supplement potassium. Need to recheck BMET Later this week or early next week.

## 2014-07-02 NOTE — Telephone Encounter (Signed)
Attempted to call patient to discuss lab results. Advised to call back to office at her convenience. Wanted to advise of yeast infection and that diflucan was sent to pharmacy. Also wanted to discuss potassium as this was low. Will attempt to call patient tomorrow to discuss these things.

## 2014-07-02 NOTE — Assessment & Plan Note (Signed)
Yeast infection on wet prep could explain her dysuria and suprapubic discomfort. Will treat with diflucan. UA with large leuks, though micro with >20 epis making this not a clean catch making UA findings less likely to represent UTI. Will send for culture prior to treating for UTI. Given return precautions.

## 2014-07-02 NOTE — Progress Notes (Signed)
PA was denied for Victoza thru [facility].  Pt need to try and fail Bydureon first before Victoza will be approved.  Derl Barrow, RN

## 2014-07-02 NOTE — Progress Notes (Signed)
Patient ID: Kathryn Crosby, female   DOB: February 21, 1960, 54 y.o.   MRN: [id number]  Tommi Rumps, MD Phone: [phone]  Kathryn Crosby is a 54 y.o. female who presents today for f/u.  DIABETES Disease Monitoring: Blood Sugar ranges-180's Polyuria/phagia/dipsia- some polyuria, see below      Optho- saw last month, states everything was normal Medications: Compliance- taking victoza, metformin, and glimeperide Hypoglycemic symptoms- no  HYPERTENSION Disease Monitoring Home BP Monitoring checking, 130's/80's Chest pain- no    Dyspnea- no Medications Compliance-  Taking lasix, metoprolol, lisinopril, and amlodipine. Edema- no  Dysuria: notes burning with urination for the past month. Notes suprapubic discomfort as well. No flank pain. No fevers. No hematuria. Notes a small amount of white vaginal discharge, though no smell. Notes some urgency and frequency. Not sexually active in over 10 years. Has a history of kidney stones.    PMH: nonsmoker.   ROS: Per HPI   Physical Exam Filed Vitals:   07/02/14 0915  BP: 134/81  Pulse: 66  Temp: 98.1 F (36.7 C)    Gen: Well NAD HEENT: PERRL,  MMM Lungs: CTABL Nl WOB Heart: RRR, no murmur appreciated Abd: soft, NT, ND, no guarding or rebound GU: normal labia, normal vaginal mucosa, no cervix appreciated, small amount of white discharge, no discomfort on bimanual, no cervix palpated, no adnexal tenderness Exts: Non edematous BL  LE, warm and well perfused.    Assessment/Plan: Please see individual problem list.  # Healthcare maintenance: patient reports hysterectomy many years ago and is unsure if cervix is in place, though does not appear to be present, has not had a pap smear in a number of years. Will request records from her gynecologist to determine need for pap smear.  Tommi Rumps, MD [facility] PGY-3

## 2014-07-03 ENCOUNTER — Other Ambulatory Visit: Payer: Self-pay | Admitting: Family Medicine

## 2014-07-03 NOTE — Telephone Encounter (Signed)
Advised pt as directed below and verbalized understanding. She stated that she is currently taking potassium citrate 2 tabs daily that was prescribed by kidney specialist (she cannot pronounce the name, its foreign) at [facility] and has been on this for 1 year now. She stated that she was put on K+ citrate because of kidney level being low (which has always been a problem for her) due to having kidney stones often .She wanted to know does she need to take the K+ supplement that you sent in since its only 2 tabs once. Also she picked up HCTZ from pharmacy as she had refill for this?? Please advise. Katherin Ramey, CMA.

## 2014-07-03 NOTE — Telephone Encounter (Signed)
Attempted to call patient again. No answer. Left VM advising to call office back. Could you please call the patient and advise her of yeast infection that I sent diflucan in for this. Also advise her that her potassium is mildly low and I sent in potassium supplement for her to take. Her low potassium is likely related to the lasix she has been taking. i would like for her to stop taking the lasix and we will restart HCTZ. Once we get in touch with her I will send this in for her. Thanks.

## 2014-07-03 NOTE — Telephone Encounter (Signed)
Called patient to discuss potassium supplement. She can use the potassium citrate for supplement. Will have her increase to 40 mEq tonight and tomorrow morning and have her come in tomorrow afternoon for recheck. She stopped the lasix. Has started back on HCTZ. Will see what follow-up BMET reveals.

## 2014-07-03 NOTE — Telephone Encounter (Signed)
Pt returned call

## 2014-07-04 ENCOUNTER — Telehealth: Payer: Self-pay | Admitting: Family Medicine

## 2014-07-04 ENCOUNTER — Other Ambulatory Visit: Payer: Medicare Other

## 2014-07-04 DIAGNOSIS — E876 Hypokalemia: Secondary | ICD-10-CM | POA: Diagnosis not present

## 2014-07-04 LAB — URINE CULTURE: Colony Count: 30000

## 2014-07-04 MED ORDER — EXENATIDE ER 2 MG ~~LOC~~ PEN
2.0000 mg | PEN_INJECTOR | SUBCUTANEOUS | Status: DC
Start: 2014-07-04 — End: 2014-10-15

## 2014-07-04 NOTE — Telephone Encounter (Signed)
Called patient to discuss bydureon. Insurance denied victoza and patient has not been on preferred bydureon. Will send in bydureon for the patient.

## 2014-07-04 NOTE — Progress Notes (Signed)
BMP DONE TODAY Kathryn Crosby 

## 2014-07-05 LAB — BASIC METABOLIC PANEL
BUN: 12 mg/dL (ref 6–23)
CO2: 25 mEq/L (ref 19–32)
CREATININE: 0.76 mg/dL (ref 0.50–1.10)
Calcium: 8.4 mg/dL (ref 8.4–10.5)
Chloride: 107 mEq/L (ref 96–112)
Glucose, Bld: 151 mg/dL — ABNORMAL HIGH (ref 70–99)
Potassium: 3.6 mEq/L (ref 3.5–5.3)
Sodium: 143 mEq/L (ref 135–145)

## 2014-07-07 ENCOUNTER — Encounter: Payer: Self-pay | Admitting: Family Medicine

## 2014-08-04 DIAGNOSIS — N10 Acute tubulo-interstitial nephritis: Secondary | ICD-10-CM | POA: Diagnosis not present

## 2014-08-04 DIAGNOSIS — N12 Tubulo-interstitial nephritis, not specified as acute or chronic: Secondary | ICD-10-CM | POA: Diagnosis not present

## 2014-08-04 DIAGNOSIS — Z91013 Allergy to seafood: Secondary | ICD-10-CM | POA: Diagnosis not present

## 2014-08-04 DIAGNOSIS — N200 Calculus of kidney: Secondary | ICD-10-CM | POA: Diagnosis not present

## 2014-08-04 DIAGNOSIS — E119 Type 2 diabetes mellitus without complications: Secondary | ICD-10-CM | POA: Diagnosis not present

## 2014-08-04 DIAGNOSIS — N2889 Other specified disorders of kidney and ureter: Secondary | ICD-10-CM | POA: Diagnosis not present

## 2014-08-04 DIAGNOSIS — K219 Gastro-esophageal reflux disease without esophagitis: Secondary | ICD-10-CM | POA: Diagnosis not present

## 2014-08-04 DIAGNOSIS — Z91041 Radiographic dye allergy status: Secondary | ICD-10-CM | POA: Diagnosis not present

## 2014-08-04 DIAGNOSIS — N29 Other disorders of kidney and ureter in diseases classified elsewhere: Secondary | ICD-10-CM | POA: Diagnosis not present

## 2014-08-04 DIAGNOSIS — E780 Pure hypercholesterolemia: Secondary | ICD-10-CM | POA: Diagnosis not present

## 2014-08-04 DIAGNOSIS — I10 Essential (primary) hypertension: Secondary | ICD-10-CM | POA: Diagnosis not present

## 2014-08-04 DIAGNOSIS — R109 Unspecified abdominal pain: Secondary | ICD-10-CM | POA: Diagnosis not present

## 2014-08-04 DIAGNOSIS — Z886 Allergy status to analgesic agent status: Secondary | ICD-10-CM | POA: Diagnosis not present

## 2014-08-06 ENCOUNTER — Other Ambulatory Visit: Payer: Self-pay | Admitting: Family Medicine

## 2014-08-07 ENCOUNTER — Telehealth: Payer: Self-pay | Admitting: *Deleted

## 2014-08-07 NOTE — Telephone Encounter (Signed)
Pt states at her last post op visit 06/16/2014, the condition of her foot was such as to need to be off an additional 6 weeks.  The form for her employer, only stated 6 weeks out-of-work without a return to work date.

## 2014-08-08 ENCOUNTER — Encounter: Payer: Self-pay | Admitting: Internal Medicine

## 2014-08-08 ENCOUNTER — Ambulatory Visit (INDEPENDENT_AMBULATORY_CARE_PROVIDER_SITE_OTHER): Payer: Medicare Other | Admitting: Internal Medicine

## 2014-08-08 VITALS — BP 119/83 | HR 97 | Temp 97.7°F | Ht 68.0 in | Wt 264.3 lb

## 2014-08-08 DIAGNOSIS — N200 Calculus of kidney: Secondary | ICD-10-CM | POA: Diagnosis not present

## 2014-08-08 DIAGNOSIS — B3731 Acute candidiasis of vulva and vagina: Secondary | ICD-10-CM

## 2014-08-08 DIAGNOSIS — R300 Dysuria: Secondary | ICD-10-CM | POA: Diagnosis not present

## 2014-08-08 DIAGNOSIS — B379 Candidiasis, unspecified: Secondary | ICD-10-CM | POA: Diagnosis not present

## 2014-08-08 DIAGNOSIS — B373 Candidiasis of vulva and vagina: Secondary | ICD-10-CM | POA: Diagnosis not present

## 2014-08-08 LAB — POCT URINALYSIS DIPSTICK
Bilirubin, UA: NEGATIVE
Glucose, UA: NEGATIVE
NITRITE UA: NEGATIVE
PH UA: 6.5
PROTEIN UA: 30
Spec Grav, UA: 1.02
Urobilinogen, UA: 0.2

## 2014-08-08 LAB — POCT UA - MICROSCOPIC ONLY

## 2014-08-08 MED ORDER — FLUCONAZOLE 150 MG PO TABS: 150.0000 mg | ORAL_TABLET | Freq: Once | ORAL | Status: DC

## 2014-08-08 MED ORDER — TRAMADOL HCL 50 MG PO TABS: 50.0000 mg | ORAL_TABLET | Freq: Three times a day (TID) | ORAL | Status: DC | PRN

## 2014-08-08 NOTE — Assessment & Plan Note (Addendum)
Seen today for ED follow up for flank pain. Renal US done in ED shows nonobstructing nephrolithiasis  - Tramadol 25mg  q 8hrs PRN given for pain management  - advised to go to ED if pain worsens or develop fevers

## 2014-08-08 NOTE — Assessment & Plan Note (Signed)
Notes of vaginal itching after taking antibiotics for UTI. No vaginal discharge or rash.  - Diflucan 150mg  PO x 1

## 2014-08-08 NOTE — Progress Notes (Signed)
Patient ID: Kathryn Crosby, female   DOB: August 28, 1960, 54 y.o.   MRN: [id number] Subjective:   CC: ED follow up   HPI:   Patient was seen in [facility] ED on 08/05/14 for sharp left flank pain. With history of chronic nephrolithiasis, renal ultrasound was done which showed bilateral nephrolithiasis without hydronephrosis. UA showed large number of leukocytes, gram stain showed gram negative rods. Urine culture showed <10,000 colonies/mL. Patient was given IV antibiotic per chart review then discharged with 10 day course of PO antibiotics (Cefodoxime). Per chart review, patient has appoint with [facility] on July 22nd.   Today patient continues to have left flank pain that is constant and sharp. Pain does not radiate. Pain has not gotten worse. No aggravating/alleviating factors. Patient feels nauseated at times but has not vomited. Denies fevers or chills. Notes of burning with urination x1 week but denies blood in urine. Patient notes suprapubic pain present previously has resolved now. Patient has is taking antibiotics as prescribed and is on 4th day of treatment. Patient does not see improvement with antibiotics. Patient has not taken medication for pain. Patient also notes of vaginal itching; she denies rash or discharge. States that she has vaginal itching whenever she takes antibiotics. She has taken Diflucan before for this.   Review of Systems - Per HPI.  PMH reviewed      Objective:  Physical Exam BP 119/83 mmHg  Pulse 97  Temp(Src) 97.7 F (36.5 C) (Oral)  Ht 5\' 8"  (1.727 m)  Wt 264 lb 4.8 oz (119.886 kg)  BMI 40.20 kg/m2 GEN: NAD CV: RRR, no murmurs, rubs, or gallops PULM: CTAB, normal effort ABD: Soft, nontender, nondistended; no suprapubic tenderness. Flank: no CVA tenderness.     Assessment:     Kathryn Crosby is a 54 y.o. female with h/o chronic kidney stones (seen by urology at [facility]),  here for ED follow up visit.     Plan:   Vaginal  yeast infection: - Diflucan 150mg  PO x1   Urinary Tract Infection: patient given 10 day course of Cefpodoxime by ED (currently of day 4). Some symptoms improved although patient continues to have dysuria. ED urine culture <10,000 colonies/mL.  - continue 10 day course of Cepodoxime  - repeat UA and urine culture - advised to go to ED if develop fevers or pain worsens.   # See problem list and after visit summary for problem-specific plans.    Follow-up: Follow up in 1-2 weeks with PCP for management of chronic medical problems   Smiley Houseman, MD [place]

## 2014-08-08 NOTE — Patient Instructions (Signed)
You were seen for your back pain due to kidney stones and burning with urination today. Please continue to take the antibiotics prescribed by the [facility] ED. I sent a prescription for Diflucan and Tramadol to the pharmacy. Please look out for any red flag symptoms including fevers, worsening or severe back pain, vomiting. If you have these symptoms please go to ED.

## 2014-08-09 LAB — URINE CULTURE
COLONY COUNT: NO GROWTH
ORGANISM ID, BACTERIA: NO GROWTH

## 2014-08-13 ENCOUNTER — Telehealth: Payer: Self-pay | Admitting: Internal Medicine

## 2014-08-13 DIAGNOSIS — N200 Calculus of kidney: Secondary | ICD-10-CM

## 2014-08-13 MED ORDER — TRAMADOL HCL 50 MG PO TABS: 50.0000 mg | ORAL_TABLET | Freq: Three times a day (TID) | ORAL | Status: DC | PRN

## 2014-08-13 NOTE — Telephone Encounter (Signed)
Will forward to PCP for review. Estanislado Surgeon, CMA. 

## 2014-08-13 NOTE — Telephone Encounter (Signed)
[facility] FaX: [phone] Tramadol was written by [place]. Needs DEA number Could another dr write RX and fax it in? Please advise

## 2014-08-13 NOTE — Telephone Encounter (Signed)
Reprinted prescription because DEA number was not on original prescription. Problem is now fixed. Will give new signed prescription to team to fax to appropriate pharmacy.

## 2014-08-13 NOTE — Telephone Encounter (Signed)
Rx faxed to [facility] at fax # [phone]. Korey Prashad, CMA.

## 2014-08-22 ENCOUNTER — Ambulatory Visit: Payer: Medicare Other | Admitting: Internal Medicine

## 2014-08-22 DIAGNOSIS — Z886 Allergy status to analgesic agent status: Secondary | ICD-10-CM | POA: Diagnosis not present

## 2014-08-22 DIAGNOSIS — Z79899 Other long term (current) drug therapy: Secondary | ICD-10-CM | POA: Diagnosis not present

## 2014-08-22 DIAGNOSIS — N200 Calculus of kidney: Secondary | ICD-10-CM | POA: Diagnosis not present

## 2014-08-22 DIAGNOSIS — Z91041 Radiographic dye allergy status: Secondary | ICD-10-CM | POA: Diagnosis not present

## 2014-09-04 DIAGNOSIS — Z6841 Body Mass Index (BMI) 40.0 and over, adult: Secondary | ICD-10-CM | POA: Diagnosis not present

## 2014-09-04 DIAGNOSIS — G4700 Insomnia, unspecified: Secondary | ICD-10-CM | POA: Diagnosis not present

## 2014-09-04 DIAGNOSIS — E1149 Type 2 diabetes mellitus with other diabetic neurological complication: Secondary | ICD-10-CM | POA: Diagnosis not present

## 2014-09-04 DIAGNOSIS — G4733 Obstructive sleep apnea (adult) (pediatric): Secondary | ICD-10-CM | POA: Diagnosis not present

## 2014-09-05 ENCOUNTER — Other Ambulatory Visit: Payer: Self-pay | Admitting: Family Medicine

## 2014-09-05 ENCOUNTER — Encounter: Payer: Self-pay | Admitting: Internal Medicine

## 2014-09-05 ENCOUNTER — Ambulatory Visit (INDEPENDENT_AMBULATORY_CARE_PROVIDER_SITE_OTHER): Payer: Medicare Other | Admitting: Internal Medicine

## 2014-09-05 VITALS — BP 120/77 | HR 70 | Temp 98.6°F | Ht 68.0 in | Wt 267.0 lb

## 2014-09-05 DIAGNOSIS — I10 Essential (primary) hypertension: Secondary | ICD-10-CM | POA: Diagnosis not present

## 2014-09-05 DIAGNOSIS — B379 Candidiasis, unspecified: Secondary | ICD-10-CM | POA: Diagnosis not present

## 2014-09-05 DIAGNOSIS — E1142 Type 2 diabetes mellitus with diabetic polyneuropathy: Secondary | ICD-10-CM

## 2014-09-05 MED ORDER — FLUCONAZOLE 150 MG PO TABS: 150.0000 mg | ORAL_TABLET | Freq: Once | ORAL | Status: DC

## 2014-09-05 MED ORDER — OMEPRAZOLE 40 MG PO CPDR: 40.0000 mg | DELAYED_RELEASE_CAPSULE | Freq: Every day | ORAL | Status: DC

## 2014-09-05 NOTE — Assessment & Plan Note (Addendum)
Notes of recurring vaginal yeast infection. Patient states she has been on Diflucan in the past for this which usually improves symptoms but that she usually has episodes once a month. States her gynecologist used to give her a moth's worth of Diflucan for her to take as needed. Recent UA did show yeast.  - will prescribe Diflucan 150mg  once - discussed with patient that this needs further evaluation from gynecologist or me as this may also be atropic vaginitis vs yeast infection that may be resistant to Diflucan. Patient understood. If she fails therapy, will likely do wet prep and send cultures for further evaluation at a future visit.  - discussed to use gentle cleaning methods as this area is sensitive and rough scrubbing may be contributing to the irritation

## 2014-09-05 NOTE — Assessment & Plan Note (Signed)
Patient is compliant with medications. A1c on 6/1 was 8.6. Medication dose was increased during visit in 07/2014. Will have patient return in 1-2 months to recheck A1c.

## 2014-09-05 NOTE — Patient Instructions (Signed)
Thank you for coming in. You were seen for your blood pressure, diabetes, and symptoms of yeast infection. I sent a prescription of Diflucan to your pharmacy. Please make an appointment for diabetes follow up in 1.5-70months.

## 2014-09-05 NOTE — Progress Notes (Signed)
Patient ID: Kathryn Crosby, female   DOB: 07/13/1960, 54 y.o.   MRN: [id number]  Subjective:   CC: follow up   HPI:   Patient states she is unsure why she had this appointment. States she has no concerns today. She mentions that she has vaginal irritation/burning again. States this started two weeks ago. States she usually gets vaginal yeast infections every month. No vaginal discharge, no blood in urine, no increase frequency in urination. Denies fevers or chills. States she is not sexually active. States she scrubs this area a lot to clean. Would like to have Diflucan for this. States her gynecologist used to give her a months worth of Diflucan so that she can take it as needed. Has flank pain but has known kidney stones; pain has not worsened from baseline. Patient states she has surgery for kidney stone removal on Monday.   HTN: Patient is currently taking Amlodipine 10mg , HCTZ, and Lisinopril 40mg  daily, and Metoprolol XL 200mg  daily. Patient does not miss doses. Patient states Lasix (which is currently on her med list) was discontinued by her previous PCP and was told to restart HCTZ. Checks BP at home and states it is around 126/82. Denies chest pain, HA, blurred vision, SOB. Notes of LE swelling that has been stable.   DM:  Patient is currently taking Exenatide 2mg  Once every wk, Metformin 1000 BID, Amaryl  4mg  daily. Patient does not miss any doses. Checks BS twice a day in AM (ranging 90-130) and in the evening (ranging 90-130). Her last A1c was 8.6 (07/02/2014). No polyuria, polydypsia.    PM and  SH reviewed  Smoking status: nonsmoker     Objective:  Physical Exam BP 120/77 mmHg  Pulse 70  Temp(Src) 98.6 F (37 C) (Oral)  Ht 5\' 8"  (1.727 m)  Wt 267 lb (121.11 kg)  BMI 40.61 kg/m2 GEN: NAD  CV: RRR, no murmurs, rubs, or gallops PULM: CTAB, normal effort ABD: Soft, nontender, nondistended, NABS, no organomegaly; no suprapubic tenderness SKIN: No rash or cyanosis; warm and  well-perfused EXTR: pitting edema noted in feet up to ankles. DP pulses 2+ bilaterally.  PSYCH: Mood and affect euthymic, normal rate and volume of speech NEURO: Awake, alert, no focal deficits grossly, normal speech      Assessment:     Kathryn Crosby is a 54 y.o. female with h/oHTN, DM2, recurrent nephrolithiasis, obesity presents for follow up    Plan:     # See problem list and after visit summary for problem-specific plans.   # Health Maintenance:  - Patient reports she had a hysterectomy in 1985 and her surgeon stated that she will not need to get pap smears anymore - Mammogram: last mammogram per patient was in Jan 2016 during which she had biopsy. Per chart review, this was in September 2014: Mammogram showed possible enlarging lymph node in left breast/axilla which recommendation of [place] of left breast. [place] results recommended biopsy which was done in 10/30/2012 which did not show any signs of malignancy. Will discuss with patient the need for mammogram this year at her next visit.   Follow-up: Follow up in 1-2 months for DM. Will also discuss the need for mammogram.    Kathryn Houseman, MD [place]

## 2014-09-05 NOTE — Assessment & Plan Note (Signed)
BP at goal today. Will continue on current regimen. Of Norvasc, HCTZ, Lisinopril, and Metoprolol XL.

## 2014-09-08 DIAGNOSIS — N189 Chronic kidney disease, unspecified: Secondary | ICD-10-CM | POA: Diagnosis not present

## 2014-09-08 DIAGNOSIS — E785 Hyperlipidemia, unspecified: Secondary | ICD-10-CM | POA: Diagnosis not present

## 2014-09-08 DIAGNOSIS — I129 Hypertensive chronic kidney disease with stage 1 through stage 4 chronic kidney disease, or unspecified chronic kidney disease: Secondary | ICD-10-CM | POA: Diagnosis not present

## 2014-09-08 DIAGNOSIS — K219 Gastro-esophageal reflux disease without esophagitis: Secondary | ICD-10-CM | POA: Diagnosis not present

## 2014-09-08 DIAGNOSIS — N200 Calculus of kidney: Secondary | ICD-10-CM | POA: Diagnosis not present

## 2014-09-08 DIAGNOSIS — Z79899 Other long term (current) drug therapy: Secondary | ICD-10-CM | POA: Diagnosis not present

## 2014-09-08 DIAGNOSIS — E119 Type 2 diabetes mellitus without complications: Secondary | ICD-10-CM | POA: Diagnosis not present

## 2014-09-23 DIAGNOSIS — N200 Calculus of kidney: Secondary | ICD-10-CM | POA: Diagnosis not present

## 2014-10-15 ENCOUNTER — Other Ambulatory Visit: Payer: Self-pay | Admitting: Internal Medicine

## 2014-10-15 ENCOUNTER — Other Ambulatory Visit: Payer: Self-pay | Admitting: Family Medicine

## 2014-10-15 NOTE — Telephone Encounter (Signed)
LM for patient to call back.  She is scheduled for 10-28-14 to see pcp.  Advise patient to make a same day appt for symptoms if unable to wait for this day.  Jazmin Hartsell,CMA

## 2014-10-15 NOTE — Telephone Encounter (Signed)
Provider is in a different office now.  Please advise refill?

## 2014-10-17 NOTE — Telephone Encounter (Signed)
Advised pt as directed below and she stated that she did not need refill as she is not having any yeast infection sx. Adams,Latoya, CMA.

## 2014-10-28 ENCOUNTER — Ambulatory Visit: Payer: Medicaid Other | Admitting: Internal Medicine

## 2014-11-16 DIAGNOSIS — R079 Chest pain, unspecified: Secondary | ICD-10-CM | POA: Diagnosis not present

## 2014-11-17 DIAGNOSIS — E1149 Type 2 diabetes mellitus with other diabetic neurological complication: Secondary | ICD-10-CM | POA: Diagnosis not present

## 2014-11-17 DIAGNOSIS — R0602 Shortness of breath: Secondary | ICD-10-CM | POA: Diagnosis not present

## 2014-11-17 DIAGNOSIS — I517 Cardiomegaly: Secondary | ICD-10-CM | POA: Diagnosis not present

## 2014-11-17 DIAGNOSIS — I10 Essential (primary) hypertension: Secondary | ICD-10-CM | POA: Diagnosis not present

## 2014-11-17 DIAGNOSIS — G4733 Obstructive sleep apnea (adult) (pediatric): Secondary | ICD-10-CM | POA: Diagnosis not present

## 2014-11-17 DIAGNOSIS — Z794 Long term (current) use of insulin: Secondary | ICD-10-CM | POA: Diagnosis not present

## 2014-11-17 DIAGNOSIS — K219 Gastro-esophageal reflux disease without esophagitis: Secondary | ICD-10-CM | POA: Diagnosis not present

## 2014-11-17 DIAGNOSIS — Z79899 Other long term (current) drug therapy: Secondary | ICD-10-CM | POA: Diagnosis not present

## 2014-11-17 DIAGNOSIS — Z91041 Radiographic dye allergy status: Secondary | ICD-10-CM | POA: Diagnosis not present

## 2014-11-17 DIAGNOSIS — R0789 Other chest pain: Secondary | ICD-10-CM | POA: Diagnosis not present

## 2014-11-17 DIAGNOSIS — Z91013 Allergy to seafood: Secondary | ICD-10-CM | POA: Diagnosis not present

## 2014-11-17 DIAGNOSIS — R079 Chest pain, unspecified: Secondary | ICD-10-CM | POA: Diagnosis not present

## 2014-11-17 DIAGNOSIS — R55 Syncope and collapse: Secondary | ICD-10-CM | POA: Diagnosis not present

## 2014-11-17 DIAGNOSIS — E669 Obesity, unspecified: Secondary | ICD-10-CM | POA: Diagnosis not present

## 2014-11-17 DIAGNOSIS — Z6839 Body mass index (BMI) 39.0-39.9, adult: Secondary | ICD-10-CM | POA: Diagnosis not present

## 2014-11-17 DIAGNOSIS — I371 Nonrheumatic pulmonary valve insufficiency: Secondary | ICD-10-CM | POA: Diagnosis not present

## 2014-11-17 DIAGNOSIS — E119 Type 2 diabetes mellitus without complications: Secondary | ICD-10-CM | POA: Diagnosis not present

## 2014-11-17 DIAGNOSIS — Z7982 Long term (current) use of aspirin: Secondary | ICD-10-CM | POA: Diagnosis not present

## 2014-11-17 DIAGNOSIS — E785 Hyperlipidemia, unspecified: Secondary | ICD-10-CM | POA: Diagnosis not present

## 2014-11-17 DIAGNOSIS — I77819 Aortic ectasia, unspecified site: Secondary | ICD-10-CM | POA: Diagnosis not present

## 2014-11-28 ENCOUNTER — Ambulatory Visit: Payer: Medicaid Other | Admitting: Internal Medicine

## 2014-12-05 ENCOUNTER — Encounter: Payer: Self-pay | Admitting: Student

## 2014-12-05 ENCOUNTER — Ambulatory Visit (INDEPENDENT_AMBULATORY_CARE_PROVIDER_SITE_OTHER): Payer: Medicare Other | Admitting: Student

## 2014-12-05 VITALS — BP 136/84 | HR 76 | Temp 98.0°F | Ht 68.0 in | Wt 257.1 lb

## 2014-12-05 DIAGNOSIS — R079 Chest pain, unspecified: Secondary | ICD-10-CM | POA: Diagnosis not present

## 2014-12-05 DIAGNOSIS — L298 Other pruritus: Secondary | ICD-10-CM

## 2014-12-05 DIAGNOSIS — K219 Gastro-esophageal reflux disease without esophagitis: Secondary | ICD-10-CM | POA: Diagnosis not present

## 2014-12-05 DIAGNOSIS — Z886 Allergy status to analgesic agent status: Secondary | ICD-10-CM | POA: Diagnosis not present

## 2014-12-05 DIAGNOSIS — R9431 Abnormal electrocardiogram [ECG] [EKG]: Secondary | ICD-10-CM | POA: Diagnosis not present

## 2014-12-05 DIAGNOSIS — R0789 Other chest pain: Secondary | ICD-10-CM | POA: Diagnosis not present

## 2014-12-05 DIAGNOSIS — Z794 Long term (current) use of insulin: Secondary | ICD-10-CM | POA: Diagnosis not present

## 2014-12-05 DIAGNOSIS — I10 Essential (primary) hypertension: Secondary | ICD-10-CM | POA: Diagnosis not present

## 2014-12-05 DIAGNOSIS — Z7984 Long term (current) use of oral hypoglycemic drugs: Secondary | ICD-10-CM | POA: Diagnosis not present

## 2014-12-05 DIAGNOSIS — Z91013 Allergy to seafood: Secondary | ICD-10-CM | POA: Diagnosis not present

## 2014-12-05 DIAGNOSIS — E785 Hyperlipidemia, unspecified: Secondary | ICD-10-CM | POA: Diagnosis not present

## 2014-12-05 DIAGNOSIS — R3915 Urgency of urination: Secondary | ICD-10-CM | POA: Diagnosis present

## 2014-12-05 DIAGNOSIS — R072 Precordial pain: Secondary | ICD-10-CM | POA: Diagnosis not present

## 2014-12-05 DIAGNOSIS — R55 Syncope and collapse: Secondary | ICD-10-CM | POA: Diagnosis not present

## 2014-12-05 DIAGNOSIS — N898 Other specified noninflammatory disorders of vagina: Secondary | ICD-10-CM | POA: Diagnosis not present

## 2014-12-05 DIAGNOSIS — Z91041 Radiographic dye allergy status: Secondary | ICD-10-CM | POA: Diagnosis not present

## 2014-12-05 DIAGNOSIS — R350 Frequency of micturition: Secondary | ICD-10-CM | POA: Diagnosis not present

## 2014-12-05 DIAGNOSIS — E119 Type 2 diabetes mellitus without complications: Secondary | ICD-10-CM | POA: Diagnosis not present

## 2014-12-05 DIAGNOSIS — Z79899 Other long term (current) drug therapy: Secondary | ICD-10-CM | POA: Diagnosis not present

## 2014-12-05 LAB — POCT URINALYSIS DIPSTICK
Bilirubin, UA: NEGATIVE
Glucose, UA: 500
KETONES UA: NEGATIVE
Nitrite, UA: NEGATIVE
PH UA: 6.5
PROTEIN UA: NEGATIVE
SPEC GRAV UA: 1.015
UROBILINOGEN UA: 0.2

## 2014-12-05 LAB — POCT WET PREP (WET MOUNT): Clue Cells Wet Prep Whiff POC: POSITIVE

## 2014-12-05 LAB — POCT UA - MICROSCOPIC ONLY

## 2014-12-05 MED ORDER — FLUCONAZOLE 150 MG PO TABS: 150.0000 mg | ORAL_TABLET | Freq: Once | ORAL | Status: DC

## 2014-12-05 NOTE — Assessment & Plan Note (Signed)
Increased frequency concerning for UTI, but no fevers, dysuria or abd pain. Could also be contributed by poorly controlled DM - Check A1c at next visit - will obtain UA and UCx if + UA. Will treat as necessary

## 2014-12-05 NOTE — Patient Instructions (Addendum)
Follow up in 1 month with PCP You were given Diflucan for vaginal yeast infection. Take 1 pill, if no relief in 3 days take one more. You were given 1 refill Please call the office with questions or concerns at [phone]

## 2014-12-05 NOTE — Addendum Note (Signed)
Addended by: [place] Pink on: 12/05/2014 04:14 PM   Modules accepted: Orders

## 2014-12-05 NOTE — Progress Notes (Signed)
   Subjective:    Patient ID: Kathryn Crosby, female    DOB: 1960-08-16, 54 y.o.   MRN: [id number]   CC: Vaginal itching  HPI 53 y/o presenting for  2 weeks of vaginal itching and burning with increased urinary frequency  Vaginal itching/burning - Present for 2 weeks - Denies malodorous discharge, fevers, chills - Has not yet tried anything OTC for this - Has had yeast infection in the past, but remotely when she was pregnant  Urinary frequency - Increase frequency over past 2 weeks, denies dysuria, abd pain, fevers/chills N/V   Review of Systems   See HPI for ROS.   Past Medical History  Diagnosis Date  . Diabetes mellitus   . Hypertension   . Hyperlipidemia   . Nephrolithiasis   . GERD (gastroesophageal reflux disease)   . Migraine   . Kidney stones   . Chronic headaches    Past Surgical History  Procedure Laterality Date  . Abdominal hysterectomy    . Tonsillectomy    . Left ankle fracture    . Lithotripsy     OB History    Gravida Para Term Preterm AB TAB SAB Ectopic Multiple Living   3 3 3       3      Social History   Social History  . Marital Status: Single    Spouse Name: N/A  . Number of Children: 3  . Years of Education: N/A   Occupational History  . retired    Social History Main Topics  . Smoking status: Never Smoker   . Smokeless tobacco: Never Used  . Alcohol Use: No  . Drug Use: No  . Sexual Activity: Not Currently   Other Topics Concern  . Not on file   Social History Narrative    Objective:  BP 136/84 mmHg  Pulse 76  Temp(Src) 98 F (36.7 C) (Oral)  Ht 5\' 8"  (1.727 m)  Wt 257 lb 2 oz (116.631 kg)  BMI 39.10 kg/m2 Vitals and nursing note reviewed  General: NAD Cardiac: RRR,  Respiratory: CTAB, normal effort   Pelvic: Nl EGBUS, normal vagina , mild thick white vaginal discharge, normal cervix, no CMT, no masses or adnexal tenderness, exam limited by body habitus   Assessment & Plan:    Vaginal itching Likely  yeast infection with contribution by DM, last A1c 07/2014 was >8. - Will give diflucan 150 x1 with 1 refill with directions to repeat in 3 days of no relief - recheck A1c at next PCP visit  Increased urinary frequency Increased frequency concerning for UTI, but no fevers, dysuria or abd pain. Could also be contributed by poorly controlled DM - Check A1c at next visit - will obtain UA and UCx if + UA. Will treat as necessary     Kalila Adkison A. Lincoln Brigham MD, [facility] Resident PGY-1 Pager [phone]

## 2014-12-05 NOTE — Assessment & Plan Note (Signed)
Likely yeast infection with contribution by DM, last A1c 07/2014 was >8. - Will give diflucan 150 x1 with 1 refill with directions to repeat in 3 days of no relief - recheck A1c at next PCP visit

## 2014-12-08 ENCOUNTER — Telehealth: Payer: Self-pay | Admitting: Student

## 2014-12-08 MED ORDER — METRONIDAZOLE 500 MG PO TABS: 500.0000 mg | ORAL_TABLET | Freq: Two times a day (BID) | ORAL | Status: DC

## 2014-12-08 NOTE — Telephone Encounter (Addendum)
Pt called regarding wet prep which revealed clue cells consistent with bacterial vaginosis. A message was left informing her to pick up her prescription for treatment. Metronidazole 500 BID x7 days was sent to her phrmacy

## 2014-12-09 ENCOUNTER — Other Ambulatory Visit: Payer: Self-pay | Admitting: *Deleted

## 2014-12-09 MED ORDER — METOPROLOL SUCCINATE ER 200 MG PO TB24: 200.0000 mg | ORAL_TABLET | Freq: Every day | ORAL | Status: DC

## 2014-12-11 ENCOUNTER — Encounter: Payer: Self-pay | Admitting: Internal Medicine

## 2014-12-11 ENCOUNTER — Ambulatory Visit (INDEPENDENT_AMBULATORY_CARE_PROVIDER_SITE_OTHER): Payer: Medicare Other | Admitting: Internal Medicine

## 2014-12-11 VITALS — BP 138/79 | HR 95 | Temp 98.2°F | Ht 68.0 in | Wt 258.0 lb

## 2014-12-11 DIAGNOSIS — E1142 Type 2 diabetes mellitus with diabetic polyneuropathy: Secondary | ICD-10-CM

## 2014-12-11 DIAGNOSIS — R0789 Other chest pain: Secondary | ICD-10-CM | POA: Diagnosis not present

## 2014-12-11 DIAGNOSIS — K219 Gastro-esophageal reflux disease without esophagitis: Secondary | ICD-10-CM | POA: Diagnosis not present

## 2014-12-11 DIAGNOSIS — Z9189 Other specified personal risk factors, not elsewhere classified: Secondary | ICD-10-CM

## 2014-12-11 DIAGNOSIS — Z87898 Personal history of other specified conditions: Secondary | ICD-10-CM | POA: Insufficient documentation

## 2014-12-11 DIAGNOSIS — R6889 Other general symptoms and signs: Secondary | ICD-10-CM | POA: Diagnosis not present

## 2014-12-11 DIAGNOSIS — R079 Chest pain, unspecified: Secondary | ICD-10-CM

## 2014-12-11 DIAGNOSIS — I10 Essential (primary) hypertension: Secondary | ICD-10-CM | POA: Diagnosis present

## 2014-12-11 LAB — TSH: TSH: 1.131 u[IU]/mL (ref 0.350–4.500)

## 2014-12-11 LAB — POCT GLYCOSYLATED HEMOGLOBIN (HGB A1C): HEMOGLOBIN A1C: 11.2

## 2014-12-11 MED ORDER — INSULIN GLARGINE 100 UNIT/ML SOLOSTAR PEN: 10.0000 [IU] | PEN_INJECTOR | Freq: Every day | SUBCUTANEOUS | Status: DC

## 2014-12-11 MED ORDER — GLIMEPIRIDE 2 MG PO TABS: ORAL_TABLET | ORAL | Status: DC

## 2014-12-11 MED ORDER — AMLODIPINE BESYLATE 10 MG PO TABS: 10.0000 mg | ORAL_TABLET | Freq: Every day | ORAL | Status: DC

## 2014-12-11 MED ORDER — OMEPRAZOLE 40 MG PO CPDR: 40.0000 mg | DELAYED_RELEASE_CAPSULE | Freq: Every day | ORAL | Status: DC

## 2014-12-11 MED ORDER — LISINOPRIL 40 MG PO TABS: 40.0000 mg | ORAL_TABLET | Freq: Every day | ORAL | Status: DC

## 2014-12-11 NOTE — Assessment & Plan Note (Signed)
Recent history of chest pain with syncope with recent hospitalization and ED visit. No findings of ischemia in work up. Patient was told to follow up with cardiology on an outpatient basis; has not done. Per chart review has been monitored with Holter Monitor without significant findings. Symptoms have not worsened since discharge from ED. No chest pain today in clinic. No syncopal events since discharge from ED. - recommended follow up with Dr. Mare Ferrari (her cardiologist)  - will order TSH today

## 2014-12-11 NOTE — Assessment & Plan Note (Addendum)
Uncontrolled as A1c 11.2 from 8.6 in June. Per history, unsure what factor(s) worsened control. Compliant with current medications  - continue current meds: Bydureon 2mg  once a week, Amaryl 4 mg, Metformin 1000BID - start Lantus (pen) 10units in the morning with titration of 1 unit per day with goal fasting CBG (110-130) - Pharmacy team educated patient regarding use of pen and titration method - Interested in nutrition consult; place referral and contact information given for Dr. Jenne Campus

## 2014-12-11 NOTE — Assessment & Plan Note (Addendum)
Recent history of chest pain with syncope with recent hospitalization and ED visit. No findings of ischemia in work up. Patient was told to follow up with cardiology on an outpatient basis; has not done. Per chart review has been monitored with Holter Monitor without significant findings. Symptoms have not worsened since discharge from ED. No chest pain today in clinic.  - recommended follow up with Dr. Mare Ferrari (her cardiologist)  - will order TSH today

## 2014-12-11 NOTE — Progress Notes (Signed)
Patient ID: Jadayah Boley, female   DOB: [birth year], 54 y.o.   MRN: [id number] Date of Visit: 12/11/2014   HPI: CC: diabetes follow up; patient notes that she was recently hospitalized for chest pain and syncope  DM2: - notes her CBGs have been elevated recently in the 200-300 (fasting) - cannot recall a reason to uncontrolled glucose. Notes of no changes in diet; she has been watching her carbohydrate intake. Notes she has been walking less this month, but was more active last month.  - has been taking medications as prescribed: Amaryl 4mg , Bydureon 2mg , Metformin 1000BID.  - notes of polydipsia, polyuria, diaphoresis - does not have lows   Recent history of chest pain with recent hospitalization: - noted of on ED visit (12/05/14) and one hospital admission (11/17/14) for chest pain with syncope.  - Per chart review:  - ED visit: for chest pain and syncopal episode. Labs were unremarkable. Troponin negative x 2, D-dimer wnl, CK wnl, CXR unremarkable, EKG unremarkable.   [facility] Encounter: for chest pain and syncopal episode. Telemetry was unremarkable. ECG negative, ECHO EF 71% and no WMAs; stress test negative for ischemia; cardiology consulted and had no further recommendations . Patient was switched from simvastatin to Lipitor  - notes of mild central chest pain that is sharp and intermittent. Pain does not radiate and no associated symptoms such as nausea, diaphoresis, shortness of breath. Usually has symptom about 3 times a week. Last episode was Monday. Lasts about 3 minutes. Occurs at any time; not worse with activity - no chest pain today   ROS: See HPI.   PHYSICAL EXAM: BP 138/79 mmHg  Pulse 95  Temp(Src) 98.2 F (36.8 C) (Oral)  Ht 5\' 8"  (1.727 m)  Wt 258 lb (117.028 kg)  BMI 39.24 kg/m2  SpO2 99% GEN: NAD HEENT: Atraumatic, normocephalic, neck supple, EOMI, sclera clear  CV: RRR, 2/6 systolic murmur, rubs, or gallops PULM: CTAB, normal effort ABD: Soft, nontender,  nondistended, NABS, no organomegaly SKIN: No rash or cyanosis; warm and well-perfused EXTR: No lower extremity edema or calf tenderness PSYCH: Mood and affect euthymic, normal rate and volume of speech NEURO: Awake, alert, no focal deficits grossly, normal speech  ASSESSMENT/PLAN:  DM (diabetes mellitus), type 2 Uncontrolled as A1c 11.2 from 8.6 in June. Per history, unsure what factor(s) worsened control. Compliant with current medications  - continue current meds: Bydureon 2mg  once a week, Amaryl 4 mg, Metformin 1000BID - start Lantus (pen) 10units in the morning with titration of 1 unit per day with goal fasting CBG (110-130) - Pharmacy team educated patient regarding use of pen and titration method   Chest pain of uncertain etiology Recent history of chest pain with syncope with recent hospitalization and ED visit. No findings of ischemia in work up. Patient was told to follow up with cardiology on an outpatient basis; has not done. Per chart review has been monitored with Holter Monitor without significant findings. Symptoms have not worsened since discharge from ED. No chest pain today in clinic.  - recommended follow up with Dr. Mare Ferrari (her cardiologist)  - will order TSH today     Dennie Fetters, MD [place]

## 2014-12-11 NOTE — Assessment & Plan Note (Addendum)
At goal. BMP at recent ED visit fairly unremarkable with Cr 0.9. - refilled medications

## 2014-12-11 NOTE — Patient Instructions (Signed)
For your diabetes, we started you on Lantus 10 units: - Take Lantus 10units in the AM tomorrow - then on 11/12, check your blood sugar before eating and before taking Lantus. If your blood sugar is greater than 130, please increase your Lantus dose by 1 unit for that day (for example, this would be 11 units) - if it continues to be elevated the next day, you can add one more unit of Lantus for that day - your goal blood sugar range should be 110-130 in the AM.  - Please see Dr. Mare Ferrari soon because you need to be seen by a cardiologist.   - We are going check your thyroid level today

## 2014-12-12 NOTE — Addendum Note (Signed)
Addended by: Smiley Houseman on: 12/12/2014 05:39 AM   Modules accepted: Orders

## 2014-12-31 ENCOUNTER — Encounter: Payer: Self-pay | Admitting: Obstetrics

## 2014-12-31 ENCOUNTER — Ambulatory Visit (INDEPENDENT_AMBULATORY_CARE_PROVIDER_SITE_OTHER): Payer: Medicare Other | Admitting: Obstetrics

## 2014-12-31 VITALS — BP 137/85 | HR 68 | Ht 68.0 in | Wt 253.0 lb

## 2014-12-31 DIAGNOSIS — Z124 Encounter for screening for malignant neoplasm of cervix: Secondary | ICD-10-CM | POA: Diagnosis not present

## 2014-12-31 DIAGNOSIS — B3731 Acute candidiasis of vulva and vagina: Secondary | ICD-10-CM

## 2014-12-31 DIAGNOSIS — Z01419 Encounter for gynecological examination (general) (routine) without abnormal findings: Secondary | ICD-10-CM

## 2014-12-31 DIAGNOSIS — Z1239 Encounter for other screening for malignant neoplasm of breast: Secondary | ICD-10-CM

## 2014-12-31 DIAGNOSIS — B373 Candidiasis of vulva and vagina: Secondary | ICD-10-CM

## 2014-12-31 DIAGNOSIS — B369 Superficial mycosis, unspecified: Secondary | ICD-10-CM

## 2014-12-31 LAB — POCT URINALYSIS DIPSTICK
Bilirubin, UA: NEGATIVE
GLUCOSE UA: 250
Ketones, UA: NEGATIVE
LEUKOCYTES UA: NEGATIVE
NITRITE UA: NEGATIVE
PH UA: 7
RBC UA: NEGATIVE
Spec Grav, UA: <=1.005
UROBILINOGEN UA: NEGATIVE

## 2014-12-31 MED ORDER — CLOTRIMAZOLE 1 % EX CREA: 1.0000 "application " | TOPICAL_CREAM | Freq: Two times a day (BID) | CUTANEOUS | Status: DC

## 2014-12-31 MED ORDER — FLUCONAZOLE 150 MG PO TABS: 150.0000 mg | ORAL_TABLET | Freq: Once | ORAL | Status: DC

## 2014-12-31 NOTE — Addendum Note (Signed)
Addended by: Lewie Loron D on: 12/31/2014 04:20 PM   Modules accepted: Orders

## 2014-12-31 NOTE — Progress Notes (Signed)
Subjective:        Kathryn Crosby is a 54 y.o. female here for a routine exam.  Current complaints: Itching and irritation of vulva and vagina.   Denies odor.  Treated for BV and yeast ~ 3 -4 weeks ago with Diflucan / Metronidazole, but the itching has returned.   Personal health questionnaire:  Is patient Ashkenazi Jewish, have a family history of breast and/or ovarian cancer: no Is there a family history of uterine cancer diagnosed at age < 49, gastrointestinal cancer, urinary tract cancer, family member who is a Field seismologist syndrome-associated carrier: no Is the patient overweight and hypertensive, family history of diabetes, personal history of gestational diabetes, preeclampsia or PCOS: no Is patient over 34, have PCOS,  family history of premature CHD under age 34, diabetes, smoke, have hypertension or peripheral artery disease:  no At any time, has a partner hit, kicked or otherwise hurt or frightened you?: no Over the past 2 weeks, have you felt down, depressed or hopeless?: no Over the past 2 weeks, have you felt little interest or pleasure in doing things?:no   Gynecologic History No LMP recorded. Patient has had a hysterectomy. Contraception: status post hysterectomy Last Pap: several years. Results were: normal Last mammogram: 2014. Results were: normal  Obstetric History OB History  Gravida Para Term Preterm AB SAB TAB Ectopic Multiple Living  3 3 3       3     # Outcome Date GA Lbr Len/2nd Weight Sex Delivery Anes PTL Lv  3 Term 09/16/81   8 lb (3.629 kg) F Vag-Spont EPI  Y  2 Term 11/29/77   7 lb 6 oz (3.345 kg) M Vag-Spont EPI  Y  1 Term 02/29/76   7 lb 9 oz (3.43 kg) M Vag-Spont EPI  Y      Past Medical History  Diagnosis Date  . Diabetes mellitus   . Hypertension   . Hyperlipidemia   . Nephrolithiasis   . GERD (gastroesophageal reflux disease)   . Migraine   . Kidney stones   . Chronic headaches     Past Surgical History  Procedure Laterality Date  .  Abdominal hysterectomy    . Tonsillectomy    . Left ankle fracture    . Lithotripsy       Current outpatient prescriptions:  .  amLODipine (NORVASC) 10 MG tablet, Take 1 tablet (10 mg total) by mouth daily., Disp: 30 tablet, Rfl: 0 .  atorvastatin (LIPITOR) 10 MG tablet, Take 10 mg by mouth daily., Disp: , Rfl:  .  glimepiride (AMARYL) 2 MG tablet, TAKE TWO TABLETS BY MOUTH ONCE DAILY WITH BREAKFAST, Disp: 60 tablet, Rfl: 0 .  Glucose Blood (BLOOD GLUCOSE TEST STRIPS) STRP, Use as directed to check blood sugar once daily, Disp: 100 each, Rfl: 12 .  Insulin Glargine (LANTUS) 100 UNIT/ML Solostar Pen, Inject 10 Units into the skin daily at 10 pm., Disp: 15 mL, Rfl: 11 .  lisinopril (PRINIVIL,ZESTRIL) 40 MG tablet, Take 1 tablet (40 mg total) by mouth daily., Disp: 30 tablet, Rfl: 0 .  metFORMIN (GLUMETZA) 1000 MG (MOD) 24 hr tablet, Take 1,000 mg by mouth 2 (two) times daily with a meal. , Disp: , Rfl:  .  metoprolol (TOPROL-XL) 200 MG 24 hr tablet, Take 1 tablet (200 mg total) by mouth daily., Disp: 90 tablet, Rfl: 3 .  omeprazole (PRILOSEC) 40 MG capsule, Take 1 capsule (40 mg total) by mouth daily., Disp: 90 capsule, Rfl: 3 .  Potassium Citrate 15 MEQ (1620 MG) TBCR, Take 15 mEq by mouth., Disp: , Rfl:  .  ULTICARE MINI PEN NEEDLES 31G X 6 MM MISC, USE TWICE DAILY AS NEEDED FOR DOSING OF BYETTA., Disp: 100 each, Rfl: 3 .  BYDUREON 2 MG PEN, INJECT 2MG  SUBCUTANEOUSLY ONCE A WEEK (Patient not taking: Reported on 12/31/2014), Disp: 4 each, Rfl: 0 .  clotrimazole (LOTRIMIN) 1 % cream, Apply 1 application topically 2 (two) times daily., Disp: 113 g, Rfl: 2 .  fluconazole (DIFLUCAN) 150 MG tablet, Take 1 tablet (150 mg total) by mouth once. (Patient not taking: Reported on 12/31/2014), Disp: 1 tablet, Rfl: 1 .  gabapentin (NEURONTIN) 100 MG capsule, Take 1 capsule (100 mg total) by mouth 3 (three) times daily. (Patient not taking: Reported on 12/11/2014), Disp: 90 capsule, Rfl: 3 .  metroNIDAZOLE  (FLAGYL) 500 MG tablet, Take 1 tablet (500 mg total) by mouth 2 (two) times daily. (Patient not taking: Reported on 12/31/2014), Disp: 14 tablet, Rfl: 0 .  naproxen (NAPROSYN) 500 MG tablet, Take 1 tablet (500 mg total) by mouth 2 (two) times daily as needed for mild pain (with food). (Patient not taking: Reported on 12/11/2014), Disp: 30 tablet, Rfl: 0 .  oxybutynin (DITROPAN-XL) 5 MG 24 hr tablet, Take 5 mg by mouth as needed., Disp: , Rfl:  .  SUMAtriptan (IMITREX) 50 MG tablet, Take 1 tablet (50 mg total) by mouth once. May repeat in 2 hours if headache persists or recurs. (Patient not taking: Reported on 12/31/2014), Disp: 10 tablet, Rfl: 0 .  Tdap (BOOSTRIX) 5-2.5-18.5 LF-MCG/0.5 injection, Inject 0.5 mLs into the muscle once. (Patient not taking: Reported on 12/31/2014), Disp: 0.5 mL, Rfl: 0 Allergies  Allergen Reactions  . Aspirin Hives  . Ivp Dye [Iodinated Diagnostic Agents] Hives  . Shellfish-Derived Products Hives and Shortness Of Breath    Social History  Substance Use Topics  . Smoking status: Never Smoker   . Smokeless tobacco: Never Used  . Alcohol Use: No    Family History  Problem Relation Age of Onset  . Coronary artery disease Brother     CABG age 64  . Diabetes Mother   . Hypertension Mother   . Kidney disease Mother       Review of Systems  Constitutional: negative for fatigue and weight loss Respiratory: negative for cough and wheezing Cardiovascular: negative for chest pain, fatigue and palpitations Gastrointestinal: negative for abdominal pain and change in bowel habits Musculoskeletal:negative for myalgias Neurological: negative for gait problems and tremors Behavioral/Psych: negative for abusive relationship, depression Endocrine: negative for temperature intolerance   Genitourinary:negative for abnormal menstrual periods, genital lesions, hot flashes, sexual problems and vaginal discharge Integument/breast: negative for breast lump, breast tenderness,  nipple discharge and skin lesion(s)    Objective:       BP 137/85 mmHg  Pulse 68  Ht 5\' 8"  (1.727 m)  Wt 253 lb (114.76 kg)  BMI 38.48 kg/m2 General:   alert  Skin:   no rash or abnormalities  Lungs:   clear to auscultation bilaterally  Heart:   regular rate and rhythm, S1, S2 normal, no murmur, click, rub or gallop  Breasts:   normal without suspicious masses, skin or nipple changes or axillary nodes  Abdomen:  normal findings: no organomegaly, soft, non-tender and no hernia  Pelvis:  External genitalia: normal general appearance Urinary system: urethral meatus normal and bladder without fullness, nontender Vaginal: normal without tenderness, induration or masses Cervix: absent Adnexa: normal bimanual exam  Uterus: absent   Lab Review Urine pregnancy test Labs reviewed yes Radiologic studies reviewed yes    Assessment:    Healthy female exam.    S/P Hysterectomy  Candida vulvovaginitis    Plan:   Clotrimazole cream Rx Diflucan Rx   Education reviewed: calcium supplements, low fat, low cholesterol diet, self breast exams and weight bearing exercise. Mammogram ordered. Follow up in: 1 year.   Meds ordered this encounter  Medications  . clotrimazole (LOTRIMIN) 1 % cream    Sig: Apply 1 application topically 2 (two) times daily.    Dispense:  113 g    Refill:  2   Orders Placed This Encounter  Procedures  . HM MAMMOGRAPHY  . POCT urinalysis dipstick

## 2015-01-02 ENCOUNTER — Other Ambulatory Visit: Payer: Self-pay | Admitting: Internal Medicine

## 2015-01-04 LAB — SURESWAB, VAGINOSIS/VAGINITIS PLUS
Atopobium vaginae: NOT DETECTED Log (cells/mL)
BV CATEGORY: UNDETERMINED — AB
C. ALBICANS, DNA: NOT DETECTED
C. GLABRATA, DNA: NOT DETECTED
C. PARAPSILOSIS, DNA: NOT DETECTED
C. TRACHOMATIS RNA, TMA: NOT DETECTED
C. TROPICALIS, DNA: NOT DETECTED
Gardnerella vaginalis: 7.3 Log (cells/mL)
LACTOBACILLUS SPECIES: 5.8 Log (cells/mL)
MEGASPHAERA SPECIES: NOT DETECTED Log (cells/mL)
N. GONORRHOEAE RNA, TMA: NOT DETECTED
T. vaginalis RNA, QL TMA: NOT DETECTED

## 2015-01-05 ENCOUNTER — Other Ambulatory Visit: Payer: Self-pay | Admitting: Obstetrics

## 2015-01-05 ENCOUNTER — Other Ambulatory Visit: Payer: Self-pay | Admitting: *Deleted

## 2015-01-05 DIAGNOSIS — I10 Essential (primary) hypertension: Secondary | ICD-10-CM

## 2015-01-05 DIAGNOSIS — B9689 Other specified bacterial agents as the cause of diseases classified elsewhere: Secondary | ICD-10-CM

## 2015-01-05 DIAGNOSIS — N760 Acute vaginitis: Principal | ICD-10-CM

## 2015-01-05 MED ORDER — POTASSIUM CITRATE ER 15 MEQ (1620 MG) PO TBCR: 1.0000 | EXTENDED_RELEASE_TABLET | Freq: Two times a day (BID) | ORAL | Status: DC

## 2015-01-05 MED ORDER — GLIMEPIRIDE 2 MG PO TABS: ORAL_TABLET | ORAL | Status: DC

## 2015-01-05 MED ORDER — METFORMIN HCL ER (MOD) 1000 MG PO TB24: 1000.0000 mg | ORAL_TABLET | Freq: Two times a day (BID) | ORAL | Status: DC

## 2015-01-05 MED ORDER — AMLODIPINE BESYLATE 10 MG PO TABS: 10.0000 mg | ORAL_TABLET | Freq: Every day | ORAL | Status: DC

## 2015-01-05 MED ORDER — LISINOPRIL 40 MG PO TABS: 40.0000 mg | ORAL_TABLET | Freq: Every day | ORAL | Status: DC

## 2015-01-05 MED ORDER — METRONIDAZOLE 0.75 % VA GEL: 1.0000 | Freq: Two times a day (BID) | VAGINAL | Status: DC

## 2015-01-05 NOTE — Telephone Encounter (Signed)
Patient states that she has called her pharmacy and they keep telling her that they do not have any of her medicines. Needs refills of metformin, potassium, amlodipine, lisinopril, glimiperide, and lancets sent to [facility].

## 2015-01-06 ENCOUNTER — Telehealth: Payer: Self-pay | Admitting: *Deleted

## 2015-01-06 DIAGNOSIS — E1140 Type 2 diabetes mellitus with diabetic neuropathy, unspecified: Secondary | ICD-10-CM

## 2015-01-06 DIAGNOSIS — Z794 Long term (current) use of insulin: Principal | ICD-10-CM

## 2015-01-06 DIAGNOSIS — E1142 Type 2 diabetes mellitus with diabetic polyneuropathy: Secondary | ICD-10-CM

## 2015-01-06 MED ORDER — ACCU-CHEK SOFTCLIX LANCETS MISC: Status: DC

## 2015-01-06 MED ORDER — GLUCOSE BLOOD VI STRP: ORAL_STRIP | Status: DC

## 2015-01-06 NOTE — Telephone Encounter (Signed)
Prior Auth form received from [facility] for Metformin. PA form placed in provider box for review. Please return form to [place]. Hubbard Hartshorn, RN

## 2015-01-06 NOTE — Telephone Encounter (Signed)
Pharmacist from [facility] called stating that the Rx for metFORMIN (GLUMETZA) 1000 MG (MOD) 24 hr tablet is not covered by insurance and the co-pay is $6700.  The change to the regular metformin ER.   Patient also stated that she did not receive the Rx for the test strips and lancets.  DM standardized form completed for Accu-Chek Aviva Plus test strips and Softclix lancets.  Form signed by Dr. Mingo Amber.  Derl Barrow, RN

## 2015-01-07 MED ORDER — METFORMIN HCL 1000 MG PO TABS: 1000.0000 mg | ORAL_TABLET | Freq: Two times a day (BID) | ORAL | Status: DC

## 2015-01-07 NOTE — Telephone Encounter (Signed)
Patient called to request to change Metformin to generic brand. Request completed. Prescription sent to pharmacy.

## 2015-01-19 ENCOUNTER — Ambulatory Visit: Payer: Medicaid Other | Admitting: Family Medicine

## 2015-01-22 DIAGNOSIS — Z91013 Allergy to seafood: Secondary | ICD-10-CM | POA: Diagnosis not present

## 2015-01-22 DIAGNOSIS — Z886 Allergy status to analgesic agent status: Secondary | ICD-10-CM | POA: Diagnosis not present

## 2015-01-22 DIAGNOSIS — Z87442 Personal history of urinary calculi: Secondary | ICD-10-CM | POA: Diagnosis not present

## 2015-01-22 DIAGNOSIS — Z79899 Other long term (current) drug therapy: Secondary | ICD-10-CM | POA: Diagnosis not present

## 2015-01-22 DIAGNOSIS — S60410A Abrasion of right index finger, initial encounter: Secondary | ICD-10-CM | POA: Diagnosis not present

## 2015-01-22 DIAGNOSIS — Z90710 Acquired absence of both cervix and uterus: Secondary | ICD-10-CM | POA: Diagnosis not present

## 2015-01-22 DIAGNOSIS — E785 Hyperlipidemia, unspecified: Secondary | ICD-10-CM | POA: Diagnosis not present

## 2015-01-22 DIAGNOSIS — Z794 Long term (current) use of insulin: Secondary | ICD-10-CM | POA: Diagnosis not present

## 2015-01-22 DIAGNOSIS — E119 Type 2 diabetes mellitus without complications: Secondary | ICD-10-CM | POA: Diagnosis not present

## 2015-01-22 DIAGNOSIS — Z9889 Other specified postprocedural states: Secondary | ICD-10-CM | POA: Diagnosis not present

## 2015-01-22 DIAGNOSIS — Z7984 Long term (current) use of oral hypoglycemic drugs: Secondary | ICD-10-CM | POA: Diagnosis not present

## 2015-01-22 DIAGNOSIS — K219 Gastro-esophageal reflux disease without esophagitis: Secondary | ICD-10-CM | POA: Diagnosis not present

## 2015-01-22 DIAGNOSIS — W260XXA Contact with knife, initial encounter: Secondary | ICD-10-CM | POA: Diagnosis not present

## 2015-01-22 DIAGNOSIS — Z91048 Other nonmedicinal substance allergy status: Secondary | ICD-10-CM | POA: Diagnosis not present

## 2015-01-22 DIAGNOSIS — I10 Essential (primary) hypertension: Secondary | ICD-10-CM | POA: Diagnosis not present

## 2015-01-28 DIAGNOSIS — I10 Essential (primary) hypertension: Secondary | ICD-10-CM | POA: Diagnosis not present

## 2015-01-28 DIAGNOSIS — Z91041 Radiographic dye allergy status: Secondary | ICD-10-CM | POA: Diagnosis not present

## 2015-01-28 DIAGNOSIS — E785 Hyperlipidemia, unspecified: Secondary | ICD-10-CM | POA: Diagnosis not present

## 2015-01-28 DIAGNOSIS — E119 Type 2 diabetes mellitus without complications: Secondary | ICD-10-CM | POA: Diagnosis not present

## 2015-01-28 DIAGNOSIS — K219 Gastro-esophageal reflux disease without esophagitis: Secondary | ICD-10-CM | POA: Diagnosis not present

## 2015-01-28 DIAGNOSIS — Z794 Long term (current) use of insulin: Secondary | ICD-10-CM | POA: Diagnosis not present

## 2015-01-28 DIAGNOSIS — Z91013 Allergy to seafood: Secondary | ICD-10-CM | POA: Diagnosis not present

## 2015-01-28 DIAGNOSIS — Z888 Allergy status to other drugs, medicaments and biological substances status: Secondary | ICD-10-CM | POA: Diagnosis not present

## 2015-01-28 DIAGNOSIS — S6992XA Unspecified injury of left wrist, hand and finger(s), initial encounter: Secondary | ICD-10-CM | POA: Diagnosis not present

## 2015-01-28 DIAGNOSIS — Z79899 Other long term (current) drug therapy: Secondary | ICD-10-CM | POA: Diagnosis not present

## 2015-01-28 DIAGNOSIS — S60222A Contusion of left hand, initial encounter: Secondary | ICD-10-CM | POA: Diagnosis not present

## 2015-02-05 ENCOUNTER — Encounter: Payer: Self-pay | Admitting: Internal Medicine

## 2015-02-05 ENCOUNTER — Ambulatory Visit (INDEPENDENT_AMBULATORY_CARE_PROVIDER_SITE_OTHER): Payer: Medicare Other | Admitting: Internal Medicine

## 2015-02-05 VITALS — BP 141/83 | HR 79 | Temp 98.5°F | Wt 252.3 lb

## 2015-02-05 DIAGNOSIS — I872 Venous insufficiency (chronic) (peripheral): Secondary | ICD-10-CM | POA: Diagnosis not present

## 2015-02-05 DIAGNOSIS — M79609 Pain in unspecified limb: Secondary | ICD-10-CM | POA: Diagnosis not present

## 2015-02-05 DIAGNOSIS — M79606 Pain in leg, unspecified: Secondary | ICD-10-CM | POA: Diagnosis not present

## 2015-02-05 DIAGNOSIS — R059 Cough, unspecified: Secondary | ICD-10-CM

## 2015-02-05 DIAGNOSIS — J069 Acute upper respiratory infection, unspecified: Secondary | ICD-10-CM

## 2015-02-05 DIAGNOSIS — I868 Varicose veins of other specified sites: Secondary | ICD-10-CM

## 2015-02-05 DIAGNOSIS — R05 Cough: Secondary | ICD-10-CM | POA: Diagnosis present

## 2015-02-05 DIAGNOSIS — I8390 Asymptomatic varicose veins of unspecified lower extremity: Secondary | ICD-10-CM

## 2015-02-05 MED ORDER — BENZONATATE 100 MG PO CAPS: 100.0000 mg | ORAL_CAPSULE | Freq: Three times a day (TID) | ORAL | Status: DC | PRN

## 2015-02-05 MED ORDER — GUAIFENESIN ER 600 MG PO TB12: 600.0000 mg | ORAL_TABLET | Freq: Two times a day (BID) | ORAL | Status: DC

## 2015-02-05 NOTE — Patient Instructions (Addendum)
[name],  For your cough, I have prescribed tessalon perles 100 mg to take up to three times a day as needed and mucinex 600 mg twice daily. Please drink plenty of fluids while you get over this upper respiratory infection. Hot showers may help loosen up secretions as well.  For your leg pain, I have placed a referral to vascular specialists. You should get a call within a week.   Thank you, Dr. Ola Spurr  Varicose Veins Varicose veins are veins that have become enlarged and twisted. They are usually seen in the legs but can occur in other parts of the body as well. CAUSES This condition is the result of valves in the veins not working properly. Valves in the veins help to return blood from the leg to the heart. If these valves are damaged, blood flows backward and backs up into the veins in the leg near the skin. This causes the veins to become larger. RISK FACTORS People who are on their feet a lot, who are pregnant, or who are overweight are more likely to develop varicose veins. SIGNS AND SYMPTOMS  Bulging, twisted-appearing, bluish veins, most commonly found on the legs.  Leg pain or a feeling of heaviness. These symptoms may be worse at the end of the day.  Leg swelling.  Changes in skin color. DIAGNOSIS A health care provider can usually diagnose varicose veins by examining your legs. Your health care provider may also recommend an ultrasound of your leg veins. TREATMENT Most varicose veins can be treated at home.However, other treatments are available for people who have persistent symptoms or want to improve the cosmetic appearance of the varicose veins. These treatment options include:  Sclerotherapy. A solution is injected into the vein to close it off.  Laser treatment. A laser is used to heat the vein to close it off.  Radiofrequency vein ablation. An electrical current produced by radio waves is used to close off the vein.  Phlebectomy. The vein is surgically  removed through small incisions made over the varicose vein.  Vein ligation and stripping. The vein is surgically removed through incisions made over the varicose vein after the vein has been tied (ligated). HOME CARE INSTRUCTIONS  Do not stand or sit in one position for long periods of time. Do not sit with your legs crossed. Rest with your legs raised during the day.  Wear compression stockings as directed by your health care provider. These stockings help to prevent blood clots and reduce swelling in your legs.  Do not wear other tight, encircling garments around your legs, pelvis, or waist.  Walk as much as possible to increase blood flow.  Raise the foot of your bed at night with 2-inch blocks.  If you get a cut in the skin over the vein and the vein bleeds, lie down with your leg raised and press on it with a clean cloth until the bleeding stops. Then place a bandage (dressing) on the cut. See your health care provider if it continues to bleed. SEEK MEDICAL CARE IF:  The skin around your ankle starts to break down.  You have pain, redness, tenderness, or hard swelling in your leg over a vein.  You are uncomfortable because of leg pain.   This information is not intended to replace advice given to you by your health care provider. Make sure you discuss any questions you have with your health care provider.   Document Released: 10/27/2004 Document Revised: 02/07/2014 Document Reviewed: 06/04/2013 Elsevier  Interactive Patient Education 2016 [employer]vier Inc.  

## 2015-02-06 DIAGNOSIS — I872 Venous insufficiency (chronic) (peripheral): Secondary | ICD-10-CM | POA: Insufficient documentation

## 2015-02-06 DIAGNOSIS — R05 Cough: Secondary | ICD-10-CM | POA: Insufficient documentation

## 2015-02-06 DIAGNOSIS — R059 Cough, unspecified: Secondary | ICD-10-CM | POA: Insufficient documentation

## 2015-02-06 DIAGNOSIS — J069 Acute upper respiratory infection, unspecified: Secondary | ICD-10-CM | POA: Insufficient documentation

## 2015-02-06 NOTE — Assessment & Plan Note (Signed)
-   Prescribed mucinex and tessalon perles for cough  - Recommended drinking plenty of fluids and taking hot showers for symptom relief

## 2015-02-06 NOTE — Progress Notes (Signed)
Subjective: Kathryn Crosby is a 55 y.o. female patient of Smiley Houseman, MD, accompanied by her fiance, presenting for cough and leg pain.   Cough: - Started 4 days ago - Making her chest sore and having trouble sleeping due to frequency of cough - Minimal phlegm - Also complains of sore throat and runny nose - Denies fevers but has had some chills - No sick contacts - Has taken zicam without any relief  Leg pain: - Has had a stabbing pain in her upper thighs for the past 2 weeks - Pain is relatively constant  - Worse with walking but patient can tolerate walking up to almost 1 mile - Wears compression stockings 5 out of 7 days; not every day due to discomfort - States she had dopplers of her leg veins several years ago but results are not available through Epic - No history of DVTs - Switched from lasix to HCTZ about 2 months ago (which is when patient first noted increased swelling of her varicosities) - On a statin and has not complained of myalgias before  T2DM: - Poorly controlled with last Hgb A1c of 11.2 12/11/2014 - Complains of tingling in hands and feet. Previously on gabapentin for about a year but did not have improvement  - ROS: Denies changes in vision, SOB - Nonsmoker  Objective: BP 141/83 mmHg  Pulse 79  Temp(Src) 98.5 F (36.9 C) (Oral)  Wt 252 lb 4.8 oz (114.443 kg)  SpO2 97% Gen: Obese 56 y.o. female in no distress, wearing face mask HEENT: MMM, minimal erythema of oropharynx and nasal turbinates, mild submandibular lymphadenopathy Cardiac: RRR, S1, S2, no m/r/g Pulm: CTAB, no increased work of breathing, occasional cough Extremities: LE with FROM, no warmth or swelling of knees bilaterally, palpation of upper thighs does not bring on stabbing pain; no pitting edema Skin: Hemosiderin deposits of medial and lateral aspects of feet bilaterally; tortuous varicosities of right upper thigh and reticular veins of left upper thighs; no rashes or areas of  skin breakdown  CK: in process  Assessment/Plan: Kathryn Crosby is a 55 y.o. female here for cough and leg pain. Likely has URI. Leg pain could have several causes including worsening chronic venous insufficiency or diabetic neuropathy and statin myalgias.  Recommend follow-up after vein specialist appointment or in about a month to follow-up diabetes.   Acute upper respiratory infection - Prescribed mucinex and tessalon perles for cough  - Recommended drinking plenty of fluids and taking hot showers for symptom relief  Chronic venous insufficiency - Continue wearing compression stockings as often as can tolerate - Elevate legs as able - Recommended low salt diet - Placed referral to vein clinic to discuss treatment options for varicosities - Also ordered CK to rule out muscle injury as source of leg pain, as patient is on a statin   Olene Floss, MD [place], PGY-1

## 2015-02-07 NOTE — Assessment & Plan Note (Addendum)
-   Continue wearing compression stockings as often as can tolerate - Elevate legs as able - Recommended low salt diet - Placed referral to vein clinic to discuss treatment options for varicosities - Also ordered CK to rule out muscle injury as source of leg pain, as patient is on a statin

## 2015-02-12 LAB — CK ISOENZYMES: CREATINE KINASE, TOTAL, (QUEST): 16 U/L — AB (ref 29–143)

## 2015-02-17 ENCOUNTER — Ambulatory Visit (INDEPENDENT_AMBULATORY_CARE_PROVIDER_SITE_OTHER): Payer: Medicare Other | Admitting: Family Medicine

## 2015-02-17 ENCOUNTER — Encounter: Payer: Self-pay | Admitting: Family Medicine

## 2015-02-17 VITALS — Ht 68.0 in | Wt 255.1 lb

## 2015-02-17 DIAGNOSIS — E1142 Type 2 diabetes mellitus with diabetic polyneuropathy: Secondary | ICD-10-CM | POA: Diagnosis not present

## 2015-02-17 NOTE — Progress Notes (Signed)
Medical Nutrition Therapy:  Appt start time: 1100 end time:  1200.  Assessment:  Primary concerns today: Blood sugar control.   Kathryn Crosby would like to learn how to better control her BG.  She feels portion control is a major area she needs to work on.   Learning Readiness: Ready   Usual eating pattern includes 2 meals and 2 snacks per day.  Gets rest/takeout foods ~4 X wk ([employer], [employer], [employer], [employer], [employer]). Frequent foods and beverages include soda (~24 oz/d), chips, cookies, other carb's (rice, pasta, mac&chs, potatoes), veg's.  Avoided foods include shellfish (allergy), dairy (lactose intol).   Usual physical activity includes none.  Would like to start walking at least 30-60 min/day.  Has constant knee pain d/t ACL tear, but is delaying surgery as long as possible.   Kathryn Crosby checks FBG daily; for last month have been 230-300 (179 this AM).  Kathryn Crosby has been taking 12 u lantus per day, and is taking all her DM med's.    24-hr recall: (Up at 4 AM) B (5:20 AM)-   1 boiled egg white, 1 slc toast, 1 banana, 1/2 orange, 2 slc bacon, 8 oz o.j.  Snk ( AM)-    L (12:30 PM)-  1 c spinach, 1 chx brst, 1/2 c mac&chs, 12 oz soda Snk (2 PM)-  2 shortbrd cookies, water D (8 PM)-  2 hotdogs in buns, must, 2 T slaw, 2 T chili, water Snk ( PM)-  --- Typical day? Yes.    Progress Towards Goal(s):  In progress.   Nutritional Diagnosis:  NI-5.8.3 Inappropriate intake of types of carbohydrates (specify): (refined carb's) As related to beverages.  As evidenced by usual daily intake of soda and juice.    Intervention:  Nutrition education.  Handouts given during visit include:  AVS  FBG log  Demonstrated degree of understanding via:  Teach Back; pt is knowledgeable about carb sources, and demonstrated understanding of how to choose foods to create a meal balanced in macronutrients.    Barriers to learning/adherence to lifestyle change: Kathryn Crosby lives alone, so little accountability for  regular meals or better food choices.   Monitoring/Evaluation:  Dietary intake, exercise, FBG, and body weight in 6 week(s).

## 2015-02-17 NOTE — Patient Instructions (Addendum)
-   Call [facility] to ask about the Diabetes Prevention Program (DPP), and how you can enroll.   - Consider moving up your bedtime to ~9 PM; start with 9:45, and make it progressively earlier.     Diet Recommendations for Diabetes  Starchy (carb) foods: Bread, rice, pasta, potatoes, corn, cereal, grits, crackers, bagels, muffins, all baked goods.  (Fruits, milk, and yogurt also have carbohydrate, but most of these foods will not spike your blood sugar as the starchy foods will.)  A few fruits do cause high blood sugars; use small portions of bananas (limit to 1/2 at a time), grapes, watermelon, oranges, and most tropical fruits.   Protein foods: Meat, fish, poultry, eggs, dairy foods, and beans such as pinto and kidney beans (beans also provide carbohydrate).  (Protein at each meal helps to stabilize blood sugar levels.)  1. Eat at least 3 REAL meals and 1-2 snacks per day. Never go more than 4-5 hours while awake without eating. Eat breakfast within the first hour of getting up.    - A REAL meal includes a protein, starch, and a veg (or veg and/or fruit).  2. Limit starchy foods to TWO per meal and ONE per snack. ONE portion of a starchy  food is equal to the following:   - ONE slice of bread (or its equivalent, such as half of a hamburger bun).   - 1/2 cup of a "scoopable" starchy food such as potatoes or rice.   - 15 grams of carbohydrate as shown on food label.      (Remember to count every 4 oz of any juice or sweet drink as a full 15 grams of carb.  Better yet, avoid those drinks!  [EAT your fruit; don't drink it!] 3. Include at every meal: a protein food, a carb food, and vegetables and/or fruit.   - Obtain twice the volume of veg's as protein or carbohydrate foods for both lunch and dinner.   - Fresh or frozen veg's are best.   - Keep frozen veg's on hand for a quick vegetable serving.    4. Record your fasting blood glucose levels daily, including an analysis of why any reading is high or low.   Bring your BG form to follow-up appt.     Qs?  Dr. Jenne CampusDU:[id number].

## 2015-02-20 DIAGNOSIS — Q615 Medullary cystic kidney: Secondary | ICD-10-CM | POA: Diagnosis not present

## 2015-02-20 DIAGNOSIS — E7800 Pure hypercholesterolemia, unspecified: Secondary | ICD-10-CM | POA: Diagnosis not present

## 2015-02-20 DIAGNOSIS — Z9889 Other specified postprocedural states: Secondary | ICD-10-CM | POA: Diagnosis not present

## 2015-02-20 DIAGNOSIS — N2889 Other specified disorders of kidney and ureter: Secondary | ICD-10-CM | POA: Diagnosis not present

## 2015-02-20 DIAGNOSIS — N200 Calculus of kidney: Secondary | ICD-10-CM | POA: Diagnosis not present

## 2015-02-20 DIAGNOSIS — G4733 Obstructive sleep apnea (adult) (pediatric): Secondary | ICD-10-CM | POA: Diagnosis not present

## 2015-02-20 DIAGNOSIS — Z886 Allergy status to analgesic agent status: Secondary | ICD-10-CM | POA: Diagnosis not present

## 2015-02-20 DIAGNOSIS — Z7984 Long term (current) use of oral hypoglycemic drugs: Secondary | ICD-10-CM | POA: Diagnosis not present

## 2015-02-20 DIAGNOSIS — I10 Essential (primary) hypertension: Secondary | ICD-10-CM | POA: Diagnosis not present

## 2015-02-20 DIAGNOSIS — Z79899 Other long term (current) drug therapy: Secondary | ICD-10-CM | POA: Diagnosis not present

## 2015-02-20 DIAGNOSIS — E119 Type 2 diabetes mellitus without complications: Secondary | ICD-10-CM | POA: Diagnosis not present

## 2015-02-23 DIAGNOSIS — H2513 Age-related nuclear cataract, bilateral: Secondary | ICD-10-CM | POA: Diagnosis not present

## 2015-02-23 DIAGNOSIS — E119 Type 2 diabetes mellitus without complications: Secondary | ICD-10-CM | POA: Diagnosis not present

## 2015-02-23 DIAGNOSIS — H1132 Conjunctival hemorrhage, left eye: Secondary | ICD-10-CM | POA: Diagnosis not present

## 2015-02-23 DIAGNOSIS — S0512XA Contusion of eyeball and orbital tissues, left eye, initial encounter: Secondary | ICD-10-CM | POA: Diagnosis not present

## 2015-02-23 LAB — HM DIABETES EYE EXAM

## 2015-02-27 ENCOUNTER — Other Ambulatory Visit: Payer: Self-pay | Admitting: *Deleted

## 2015-02-27 DIAGNOSIS — M79605 Pain in left leg: Secondary | ICD-10-CM

## 2015-02-27 DIAGNOSIS — M79604 Pain in right leg: Secondary | ICD-10-CM

## 2015-03-11 DIAGNOSIS — E1149 Type 2 diabetes mellitus with other diabetic neurological complication: Secondary | ICD-10-CM | POA: Diagnosis not present

## 2015-03-11 DIAGNOSIS — G4733 Obstructive sleep apnea (adult) (pediatric): Secondary | ICD-10-CM | POA: Diagnosis not present

## 2015-03-11 DIAGNOSIS — Z6841 Body Mass Index (BMI) 40.0 and over, adult: Secondary | ICD-10-CM | POA: Diagnosis not present

## 2015-03-23 ENCOUNTER — Ambulatory Visit: Payer: Medicaid Other | Admitting: Family Medicine

## 2015-03-25 ENCOUNTER — Encounter: Payer: Self-pay | Admitting: Surgery

## 2015-03-30 ENCOUNTER — Ambulatory Visit (HOSPITAL_COMMUNITY)
Admission: RE | Admit: 2015-03-30 | Discharge: 2015-03-30 | Disposition: A | Discharge status: Home / Self Care | Payer: Medicare Other | Source: Ambulatory Visit | Attending: Surgery | Admitting: Surgery

## 2015-03-30 ENCOUNTER — Ambulatory Visit (INDEPENDENT_AMBULATORY_CARE_PROVIDER_SITE_OTHER): Payer: Medicare Other | Admitting: Surgery

## 2015-03-30 ENCOUNTER — Encounter: Payer: Self-pay | Admitting: Surgery

## 2015-03-30 VITALS — BP 128/86 | HR 66 | Temp 97.0°F | Resp 16 | Ht 68.0 in | Wt 257.0 lb

## 2015-03-30 DIAGNOSIS — I10 Essential (primary) hypertension: Secondary | ICD-10-CM | POA: Insufficient documentation

## 2015-03-30 DIAGNOSIS — M79605 Pain in left leg: Secondary | ICD-10-CM | POA: Diagnosis not present

## 2015-03-30 DIAGNOSIS — R609 Edema, unspecified: Secondary | ICD-10-CM | POA: Diagnosis present

## 2015-03-30 DIAGNOSIS — I872 Venous insufficiency (chronic) (peripheral): Secondary | ICD-10-CM | POA: Diagnosis not present

## 2015-03-30 DIAGNOSIS — E119 Type 2 diabetes mellitus without complications: Secondary | ICD-10-CM | POA: Diagnosis not present

## 2015-03-30 DIAGNOSIS — M79604 Pain in right leg: Secondary | ICD-10-CM | POA: Insufficient documentation

## 2015-03-30 DIAGNOSIS — E785 Hyperlipidemia, unspecified: Secondary | ICD-10-CM | POA: Diagnosis not present

## 2015-03-30 DIAGNOSIS — I8393 Asymptomatic varicose veins of bilateral lower extremities: Secondary | ICD-10-CM | POA: Insufficient documentation

## 2015-03-30 DIAGNOSIS — I8390 Asymptomatic varicose veins of unspecified lower extremity: Secondary | ICD-10-CM

## 2015-03-30 HISTORY — DX: Asymptomatic varicose veins of unspecified lower extremity: I83.90

## 2015-03-30 NOTE — Progress Notes (Signed)
Patient name: Kathryn Crosby MRN: [id number] DOB: 01/24/1961 Sex: female   Referred by: Dr. Ola Spurr  Reason for referral:  Chief Complaint  Patient presents with  . New Evaluation    Ref. by Dr. Barnet Pall  C/O Bilateral Varicose Veins,   Bilat. leg swelling pain level 7 off/on for 12-15 yrs    HISTORY OF PRESENT ILLNESS:  This is a 55 year old female who is referred today for bilateral varicose veins. She states that she has had problems with her legs for many many years. They both bother her, however the right leg is worse than the left. Standing makes them worse.  There are no relieving factors. She describes the pain as a stabbing feeling mostly in her thighs. She denies any history of trauma.  She denies ulcers or infection in her legs.  There is no history of DVT.  She has tried Lasix but this does not help.  The patient suffers from diabetes which is poorly controlled.  Her hemoglobin A1c  On 12/11/2014 as 11.2. She suffers from hypercholesterolemia which is managed with a statin. She is also medically managed for hypertension on multiple medications including an ACE inhibitor. She is a nonsmoker.  Past Medical History  Diagnosis Date  . Diabetes mellitus   . Hypertension   . Hyperlipidemia   . Nephrolithiasis   . GERD (gastroesophageal reflux disease)   . Migraine   . Kidney stones   . Chronic headaches   . Varicose veins 03-30-15    Bilateral Leg    Past Surgical History  Procedure Laterality Date  . Abdominal hysterectomy    . Tonsillectomy    . Left ankle fracture    . Lithotripsy      Social History   Social History  . Marital Status: Single    Spouse Name: N/A  . Number of Children: 3  . Years of Education: N/A   Occupational History  . retired    Social History Main Topics  . Smoking status: Never Smoker   . Smokeless tobacco: Never Used  . Alcohol Use: No  . Drug Use: No  . Sexual Activity: Not Currently    Birth Control/ Protection:  Surgical     Comment: hysterectomy   Other Topics Concern  . Not on file   Social History Narrative    Family History  Problem Relation Age of Onset  . Coronary artery disease Brother     CABG age 51  . Diabetes Mother   . Hypertension Mother   . Kidney disease Mother   . Varicose Veins Mother   . Varicose Veins Father     Allergies as of 03/30/2015 - Review Complete 03/30/2015  Allergen Reaction Noted  . Aspirin Hives 03/16/2010  . Ivp dye [iodinated diagnostic agents] Hives 09/27/2010  . Shellfish-derived products Hives and Shortness Of Breath 04/07/2010    Current Outpatient Prescriptions on File Prior to Visit  Medication Sig Dispense Refill  . ACCU-CHEK SOFTCLIX LANCETS lancets Use to check blood sugar twice daily.  Dx code: E11.9, E11.40 100 each 11  . amLODipine (NORVASC) 10 MG tablet Take 1 tablet (10 mg total) by mouth daily. 30 tablet 0  . atorvastatin (LIPITOR) 10 MG tablet Take 10 mg by mouth daily.    Marland Kitchen glimepiride (AMARYL) 2 MG tablet TAKE TWO TABLETS BY MOUTH ONCE DAILY WITH  BREAKFAST 60 tablet 0  . glucose blood (ACCU-CHEK AVIVA PLUS) test strip Use to check blood sugar twice daily.  Dx  code E11.9, E11.40. 100 each 11  . guaiFENesin (MUCINEX) 600 MG 12 hr tablet Take 1 tablet (600 mg total) by mouth 2 (two) times daily. 30 tablet 1  . Insulin Glargine (LANTUS) 100 UNIT/ML Solostar Pen Inject 10 Units into the skin daily at 10 pm. 15 mL 11  . lisinopril (PRINIVIL,ZESTRIL) 40 MG tablet Take 1 tablet (40 mg total) by mouth daily. 30 tablet 0  . metFORMIN (GLUCOPHAGE) 1000 MG tablet Take 1 tablet (1,000 mg total) by mouth 2 (two) times daily with a meal. 180 tablet 3  . metoprolol (TOPROL-XL) 200 MG 24 hr tablet Take 1 tablet (200 mg total) by mouth daily. 90 tablet 3  . omeprazole (PRILOSEC) 40 MG capsule Take 1 capsule (40 mg total) by mouth daily. 90 capsule 3  . Potassium Citrate 15 MEQ (1620 MG) TBCR Take 1 tablet by mouth 2 (two) times daily. 120 tablet 0    . Tdap (BOOSTRIX) 5-2.5-18.5 LF-MCG/0.5 injection Inject 0.5 mLs into the muscle once. 0.5 mL 0  . ULTICARE MINI PEN NEEDLES 31G X 6 MM MISC USE TWICE DAILY AS NEEDED FOR DOSING OF BYETTA. 100 each 3  . benzonatate (TESSALON) 100 MG capsule Take 1 capsule (100 mg total) by mouth 3 (three) times daily as needed for cough. (Patient not taking: Reported on 03/30/2015) 30 capsule 0   No current facility-administered medications on file prior to visit.     REVIEW OF SYSTEMS: Cardiovascular: No chest pain, chest pressure, palpitations.  Please see history of present illness for pertinent positives and negatives Pulmonary: No productive cough, asthma or wheezing. Neurologic: No weakness, paresthesias, aphasia, or amaurosis. No dizziness. Hematologic: No bleeding problems or clotting disorders. Musculoskeletal: No joint pain or joint swelling. Gastrointestinal: No blood in stool or hematemesis Genitourinary: No dysuria or hematuria. Psychiatric:: No history of major depression. Integumentary: No rashes or ulcers. Constitutional: No fever or chills.  PHYSICAL EXAMINATION:  Filed Vitals:   03/30/15 1244  BP: 128/86  Pulse: 66  Temp: 97 F (36.1 C)  TempSrc: Oral  Resp: 16  Height: 5\' 8"  (1.727 m)  Weight: 257 lb (116.574 kg)  SpO2: 97%   Body mass index is 39.09 kg/(m^2). General: The patient appears their stated age.   HEENT:  No gross abnormalities Pulmonary: Respirations are non-labored Musculoskeletal: There are no major deformities.   Neurologic: No focal weakness or paresthesias are detected, Skin:  Hyperpigmentation to  bilateralankle regions Psychiatric: The patient has normal affect. Cardiovascular:  Bilateral pitting edema.  Prominent varicosities on the anterior right thigh , also with spider veins on the right medial thigh and left anterior thigh  Diagnostic Studies:  I have ordered and reviewed her ultrasound. She has a very tortuous saphenous vein with focal areas of  reflux.  The diameter is 0.58 in the mid thigh on the right.  She also has common femoral reflux bilaterally.    Assessment:   venous insufficiency Plan:  the patient has painful varicosities, edema and hyperpigmentation to both legs.  On her ultrasound today, her saphenous vein was very tortuous.  I have asked the patient to complete a trial of 20-30 thigh-high compression stockings to see if this will help alleviate some of her symptoms.  Again have her come back in 2 months for repeat evaluation.  She may or may not be a candidate for endovenous ablation , however she is interested in stab phlebectomy and sclerotherapy.     Eldridge Abrahams, M.D. [facility] [place] Office: [phone] Pager:  [phone]

## 2015-04-02 ENCOUNTER — Other Ambulatory Visit: Payer: Self-pay | Admitting: Internal Medicine

## 2015-04-02 NOTE — Telephone Encounter (Signed)
Pt called and needs a refill on her Hydrochlorothiazide called in to her pharmacy [facility] in [email or web address]

## 2015-04-09 ENCOUNTER — Ambulatory Visit
Admission: RE | Admit: 2015-04-09 | Discharge: 2015-04-09 | Disposition: A | Discharge status: Home / Self Care | Payer: Medicare Other | Source: Ambulatory Visit | Attending: Family Medicine | Admitting: Family Medicine

## 2015-04-09 ENCOUNTER — Encounter: Payer: Self-pay | Admitting: Family Medicine

## 2015-04-09 ENCOUNTER — Ambulatory Visit (INDEPENDENT_AMBULATORY_CARE_PROVIDER_SITE_OTHER): Payer: Medicare Other | Admitting: Family Medicine

## 2015-04-09 VITALS — BP 135/74 | HR 67 | Temp 97.8°F | Wt 260.3 lb

## 2015-04-09 DIAGNOSIS — S6991XA Unspecified injury of right wrist, hand and finger(s), initial encounter: Secondary | ICD-10-CM

## 2015-04-09 DIAGNOSIS — M79644 Pain in right finger(s): Secondary | ICD-10-CM | POA: Diagnosis not present

## 2015-04-09 DIAGNOSIS — M1811 Unilateral primary osteoarthritis of first carpometacarpal joint, right hand: Secondary | ICD-10-CM | POA: Diagnosis not present

## 2015-04-09 DIAGNOSIS — S6990XA Unspecified injury of unspecified wrist, hand and finger(s), initial encounter: Secondary | ICD-10-CM | POA: Insufficient documentation

## 2015-04-09 NOTE — Assessment & Plan Note (Addendum)
Hyperextension 3 weeks ago, pain with mild swelling and bruising, worsening since injury, ROM limited by pain - xrays of thumb and wrist with some CMC degeneration but no fracture or other acute injury apparent - splint given - given chronicity may need MRI if not improving with splinting and icing over the next 1-2 weeks

## 2015-04-09 NOTE — Progress Notes (Signed)
   Subjective:   Yecica Bove is a 55 y.o. female with a history of HTN, DM here for hand pain  Problem began 3 weeks ago, slipped ad hyperextended right thumb with most of body weight. Immediate pain that has not gone away since. No edema that she noticed. Progression: stable to slight worsening Medications tried: none Anything improved it: no Anything worsen it: moving thumb  Had similar problem before: no  Review of Systems:  Per HPI. All other systems reviewed and are negative.   PMH, PSH, Medications, Allergies, and FmHx reviewed and updated in EMR.  Social History: never smoker  Objective:  BP 135/74 mmHg  Pulse 67  Temp(Src) 97.8 F (36.6 C) (Oral)  Wt 260 lb 4.8 oz (118.071 kg)  Gen:  55 y.o. female in NAD CV: RRR, no MRG, no JVD Resp: Non-labored, CTAB, no wheezes noted Abd: Soft, NTND, BS present, no guarding or organomegaly Ext: WWP, no edema MSK: Thumb flexion and extension limited by pain but active movement preserved. Tender over 1st metacarpal, especially at the base and radial side of wrist, no snuffbox tenderness Neuro: Alert and oriented, speech normal      Chemistry      Component Value Date/Time   NA 143 07/04/2014 1406   K 3.6 07/04/2014 1406   CL 107 07/04/2014 1406   CO2 25 07/04/2014 1406   BUN 12 07/04/2014 1406   CREATININE 0.76 07/04/2014 1406   CREATININE 0.90 02/15/2014 1400      Component Value Date/Time   CALCIUM 8.4 07/04/2014 1406   ALKPHOS 76 02/15/2014 1400   AST 18 02/15/2014 1400   ALT 12 02/15/2014 1400   BILITOT 0.5 02/15/2014 1400      Lab Results  Component Value Date   WBC 10.0 05/06/2014   HGB 12.7 05/06/2014   HCT 38.2 05/06/2014   MCV 81.3 05/06/2014   PLT 385 05/06/2014   Lab Results  Component Value Date   TSH 1.131 12/11/2014   Lab Results  Component Value Date   HGBA1C 11.2 12/11/2014   Assessment & Plan:     Elder Wilger is a 55 y.o. female here for thumb pain  Thumb  injury Hyperextension 3 weeks ago, pain with mild swelling and bruising, worsening since injury, ROM limited by pain - xrays of thumb and wrist ordered - splint given - will call patient with xray results and may need hand referral at that time   Beverlyn Roux, MD, MPH [facility] PGY-3 04/09/2015 11:18 AM

## 2015-04-13 ENCOUNTER — Other Ambulatory Visit: Payer: Self-pay | Admitting: *Deleted

## 2015-04-13 MED ORDER — GLUCOSE BLOOD VI STRP: ORAL_STRIP | Status: DC

## 2015-04-20 ENCOUNTER — Telehealth: Payer: Self-pay | Admitting: *Deleted

## 2015-04-20 MED ORDER — GLUCOSE BLOOD VI STRP: ORAL_STRIP | Status: DC

## 2015-04-20 NOTE — Telephone Encounter (Signed)
Received a fax from [facility] needing a new Rx for Accu-check Aviva Plus test strips.  Per medicare after 5 refills they require a new Rx.  Standardized DM form completed for Accu-Chek Aviva Plus test strips #100, 5 refills.  Form signed by Dr. Andria Frames and faxed back to [facility].  Derl Barrow, RN

## 2015-04-21 ENCOUNTER — Telehealth: Payer: Self-pay | Admitting: Internal Medicine

## 2015-04-21 DIAGNOSIS — S6991XD Unspecified injury of right wrist, hand and finger(s), subsequent encounter: Secondary | ICD-10-CM

## 2015-04-21 DIAGNOSIS — M25531 Pain in right wrist: Secondary | ICD-10-CM

## 2015-04-21 NOTE — Telephone Encounter (Signed)
Need to get and MRI for right hand.  Not getting any better

## 2015-04-21 NOTE — Telephone Encounter (Signed)
Will forward to Dr. Sherril Cong who saw patient recently for hand pain. Dayzee Trower,CMA

## 2015-04-22 NOTE — Telephone Encounter (Signed)
MRI ordered, please schedule and call patient with appt

## 2015-05-01 ENCOUNTER — Ambulatory Visit
Admission: RE | Admit: 2015-05-01 | Discharge: 2015-05-01 | Disposition: A | Discharge status: Home / Self Care | Payer: Medicare Other | Source: Ambulatory Visit | Attending: Family Medicine | Admitting: Family Medicine

## 2015-05-01 DIAGNOSIS — M25531 Pain in right wrist: Secondary | ICD-10-CM | POA: Diagnosis not present

## 2015-05-01 DIAGNOSIS — S6991XD Unspecified injury of right wrist, hand and finger(s), subsequent encounter: Secondary | ICD-10-CM

## 2015-05-02 ENCOUNTER — Other Ambulatory Visit: Payer: Self-pay | Admitting: Internal Medicine

## 2015-05-05 NOTE — Telephone Encounter (Signed)
Called patient to inform of refill of medications. Also recommended to make a follow up appointment for DM.

## 2015-05-11 ENCOUNTER — Telehealth: Payer: Self-pay | Admitting: *Deleted

## 2015-05-11 NOTE — Telephone Encounter (Signed)
Received fax from [facility] needing an update Rx for Fastclix Lancets.  Standardized DM form completed, signed by Dr. Gwendlyn Deutscher and faxed to [facility].  Derl Barrow, RN

## 2015-05-12 ENCOUNTER — Other Ambulatory Visit: Payer: Self-pay

## 2015-05-12 ENCOUNTER — Ambulatory Visit (INDEPENDENT_AMBULATORY_CARE_PROVIDER_SITE_OTHER): Payer: Medicare Other | Admitting: Internal Medicine

## 2015-05-12 ENCOUNTER — Encounter: Payer: Self-pay | Admitting: Internal Medicine

## 2015-05-12 VITALS — BP 122/85 | HR 80 | Temp 98.5°F | Ht 68.0 in | Wt 258.5 lb

## 2015-05-12 DIAGNOSIS — Z114 Encounter for screening for human immunodeficiency virus [HIV]: Secondary | ICD-10-CM | POA: Diagnosis not present

## 2015-05-12 DIAGNOSIS — S6991XD Unspecified injury of right wrist, hand and finger(s), subsequent encounter: Secondary | ICD-10-CM | POA: Diagnosis not present

## 2015-05-12 DIAGNOSIS — E1142 Type 2 diabetes mellitus with diabetic polyneuropathy: Secondary | ICD-10-CM

## 2015-05-12 DIAGNOSIS — Z1231 Encounter for screening mammogram for malignant neoplasm of breast: Secondary | ICD-10-CM

## 2015-05-12 DIAGNOSIS — Z1159 Encounter for screening for other viral diseases: Secondary | ICD-10-CM | POA: Diagnosis not present

## 2015-05-12 LAB — LIPID PANEL
CHOL/HDL RATIO: 4.2 ratio (ref <=5.0)
Cholesterol: 140 mg/dL (ref 125–200)
HDL: 33 mg/dL — AB (ref >=46)
LDL CALC: 84 mg/dL (ref <130)
Triglycerides: 114 mg/dL (ref <150)
VLDL: 23 mg/dL (ref <30)

## 2015-05-12 LAB — POCT GLYCOSYLATED HEMOGLOBIN (HGB A1C): Hemoglobin A1C: 9.9

## 2015-05-12 LAB — HEPATITIS C ANTIBODY: HCV AB: NEGATIVE

## 2015-05-12 MED ORDER — ATORVASTATIN CALCIUM 10 MG PO TABS: 10.0000 mg | ORAL_TABLET | Freq: Every day | ORAL | Status: DC

## 2015-05-12 MED ORDER — POTASSIUM CITRATE ER 15 MEQ (1620 MG) PO TBCR: 1.0000 | EXTENDED_RELEASE_TABLET | Freq: Two times a day (BID) | ORAL | Status: DC

## 2015-05-12 MED ORDER — GLIMEPIRIDE 2 MG PO TABS: ORAL_TABLET | ORAL | Status: DC

## 2015-05-12 MED ORDER — LISINOPRIL 40 MG PO TABS
40.0000 mg | ORAL_TABLET | Freq: Every day | ORAL | Status: DC
Start: 2015-05-12 — End: 2015-07-07

## 2015-05-12 MED ORDER — AMLODIPINE BESYLATE 10 MG PO TABS: 10.0000 mg | ORAL_TABLET | Freq: Every day | ORAL | Status: DC

## 2015-05-12 MED ORDER — METOPROLOL SUCCINATE ER 200 MG PO TB24: 200.0000 mg | ORAL_TABLET | Freq: Every day | ORAL | Status: DC

## 2015-05-12 NOTE — Progress Notes (Signed)
Patient ID: Kathryn Crosby, female   DOB: 06-04-1960, 55 y.o.   MRN: [id number] Date of Visit: 05/12/2015   HPI: Patient is here for DM follow up.   DM2:  - She was last seen in 12/2014 and was found to have A1c of 11.2 (from 8.6 om 07/2014). She was put on Lantus 10 units and asked to titrate 1 unit a day until fasting CBGs 110-130. She was also asked to continue Metformin and Glyburide.  - taking 12 units of Lantus in the morning - checks CBGs in the AM fasting and in the evening at 6PM  - AM sugars: 110 (once in December) , 150, 160, 200s; more recently in ths 150s up to 221 - has been leaving Latunusat 12 units; afraid to titrate up- no particular reason  - no low CBGs  - no polydypsia/polyuria   - saw vein specialist in February for chronic venous insufficiency and varicose veins- compression hose helping significantly with bilateral LE swelling  - up to date on foot exam and opthalmology exam   Thumb Pain:  - reports she injured thumb in February and is inquiring about the results of MRI she had done - was seen in clinic on 3/9 with Dr. Sherril Cong: x-ray of thumb without acute findings. MRI of right wrist obtained due to continued pain which showed miderate-sized joint effusion at radioulnar joint; also notes that imaging did not examine entire thumb and dedicated MRI may be needed if there is concern for distal injury  - has not tried anything for symptoms - symptoms are not worsening but not improving either  ROS: See HPI.  [place]:  DM2 Varicose veins HLD  PHYSICAL EXAM: BP 122/85 mmHg  Pulse 80  Temp(Src) 98.5 F (36.9 C) (Oral)  Ht 5\' 8"  (1.727 m)  Wt 258 lb 8 oz (117.255 kg)  BMI 39.31 kg/m2 Gen: NAD Heart: RRR Lungs: CTAB, normal effort  Lower Ext: mild pitting edema to mid shin bilaterally, darkened skin likely due to chronic venous stasis; DP pulses palpable bilaterally  Right Hand: tenderness mainly over the flexor pollicis brevis/abductor pollicis brevis muscles.  Flexion of thumb MCP joint slightly limited due to pain compared to left hand. Normal pulses on hand, good capillary refill. Possible mild swelling in the area of flexor pollicis brevis/abductor pollicis brevis muscles compared to the left hand  ASSESSMENT/PLAN:  Health maintenance:  - mammogram contact information given  - lipid panel obtained today - Hep C and HIV screen today   Thumb injury Continued pain after injury; symptoms not worsening. Wondering if patient needs MRI hand to fully evaluate the thumb.  - will refer to sports medicine for further evaluation  DM (diabetes mellitus), type 2 A1c has improve but still above goal. Patient continues to have elevated fasting CBGs and has not been titrating up Lantus as recommended at last visit; currently on Lantus 12 units a day. Also taking Metformin and Glyburide. Encouraged to tirate up Lantus for better control: - for fasting CBG > 130, can increase Lantus by 1 unit per day - call if patient reaches Lantus 16 units - educated patient on monitoring for hypoglycemia and how to correct hypoglycemia - bring record of CBGs to clinic - close follow up to in 3-4 weeks to evaluate CBGs - already up to date on foot exam, optho, and PNA vaccine    FOLLOW UP: Follow up in 3 weeks or sooner for DM   Smiley Houseman, MD PGY [facility]

## 2015-05-12 NOTE — Assessment & Plan Note (Signed)
Continued pain after injury; symptoms not worsening. Wondering if patient needs MRI hand to fully evaluate the thumb.  - will refer to sports medicine for further evaluation

## 2015-05-12 NOTE — Assessment & Plan Note (Signed)
A1c has improve but still above goal. Patient continues to have elevated fasting CBGs and has not been titrating up Lantus as recommended at last visit; currently on Lantus 12 units a day. Also taking Metformin and Glyburide. Encouraged to tirate up Lantus for better control: - for fasting CBG > 130, can increase Lantus by 1 unit per day - call if patient reaches Lantus 16 units - educated patient on monitoring for hypoglycemia and how to correct hypoglycemia - bring record of CBGs to clinic - close follow up to in 3-4 weeks to evaluate CBGs - already up to date on foot exam, optho, and PNA vaccine

## 2015-05-12 NOTE — Patient Instructions (Signed)
I would like you to continue to titrate your Lantus: for any morning glucose over 130, increase Lantus unit by 1 unit a day. If you reach 16 units, please call the clinic. Please monitor for any lows.  - keep track for you sugars; bring the log in to clinic - follow up in 3 weeks or sooner so we can see how your sugars are doing  - we are doing labs today  - make an appointment for your mammogram

## 2015-05-13 LAB — HIV ANTIBODY (ROUTINE TESTING W REFLEX): HIV: NONREACTIVE

## 2015-05-14 ENCOUNTER — Telehealth: Payer: Self-pay | Admitting: Obstetrics

## 2015-05-14 ENCOUNTER — Encounter: Payer: Self-pay | Admitting: Obstetrics

## 2015-05-14 ENCOUNTER — Other Ambulatory Visit: Payer: Self-pay | Admitting: Obstetrics

## 2015-05-14 ENCOUNTER — Ambulatory Visit (INDEPENDENT_AMBULATORY_CARE_PROVIDER_SITE_OTHER): Payer: Medicare Other | Admitting: Obstetrics

## 2015-05-14 VITALS — BP 128/80 | HR 80 | Temp 98.1°F | Wt 261.0 lb

## 2015-05-14 DIAGNOSIS — A499 Bacterial infection, unspecified: Secondary | ICD-10-CM | POA: Diagnosis not present

## 2015-05-14 DIAGNOSIS — N760 Acute vaginitis: Secondary | ICD-10-CM | POA: Diagnosis not present

## 2015-05-14 DIAGNOSIS — R319 Hematuria, unspecified: Secondary | ICD-10-CM

## 2015-05-14 DIAGNOSIS — B3731 Acute candidiasis of vulva and vagina: Secondary | ICD-10-CM

## 2015-05-14 DIAGNOSIS — B373 Candidiasis of vulva and vagina: Secondary | ICD-10-CM

## 2015-05-14 DIAGNOSIS — B9689 Other specified bacterial agents as the cause of diseases classified elsewhere: Secondary | ICD-10-CM

## 2015-05-14 LAB — POCT URINALYSIS DIPSTICK
BILIRUBIN UA: NEGATIVE
Glucose, UA: NEGATIVE
KETONES UA: NEGATIVE
NITRITE UA: NEGATIVE
PH UA: 6
PROTEIN UA: NEGATIVE
Spec Grav, UA: <=1.005
Urobilinogen, UA: NEGATIVE

## 2015-05-14 MED ORDER — METRONIDAZOLE 500 MG PO TABS: 500.0000 mg | ORAL_TABLET | Freq: Two times a day (BID) | ORAL | Status: DC

## 2015-05-14 NOTE — Progress Notes (Signed)
Patient ID: Kathryn Crosby, female   DOB: [birth year], 55 y.o.   MRN: [id number]  Chief Complaint  Patient presents with  . Vaginitis    HPI Kathryn Crosby is a 55 y.o. female.  Fishy vaginal discharge.   S/P Hysterectomy years ago. HPI  Past Medical History  Diagnosis Date  . Diabetes mellitus   . Hypertension   . Hyperlipidemia   . Nephrolithiasis   . GERD (gastroesophageal reflux disease)   . Migraine   . Kidney stones   . Chronic headaches   . Varicose veins 03-30-15    Bilateral Leg    Past Surgical History  Procedure Laterality Date  . Abdominal hysterectomy    . Tonsillectomy    . Left ankle fracture    . Lithotripsy      Family History  Problem Relation Age of Onset  . Coronary artery disease Brother     CABG age 81  . Diabetes Mother   . Hypertension Mother   . Kidney disease Mother   . Varicose Veins Mother   . Varicose Veins Father     Social History Social History  Substance Use Topics  . Smoking status: Never Smoker   . Smokeless tobacco: Never Used  . Alcohol Use: No    Allergies  Allergen Reactions  . Aspirin Hives  . Ivp Dye [Iodinated Diagnostic Agents] Hives  . Shellfish-Derived Products Hives and Shortness Of Breath    Current Outpatient Prescriptions  Medication Sig Dispense Refill  . ACCU-CHEK SOFTCLIX LANCETS lancets Use to check blood sugar twice daily.  Dx code: E11.9, E11.40 100 each 11  . amLODipine (NORVASC) 10 MG tablet Take 1 tablet (10 mg total) by mouth daily. 30 tablet 0  . atorvastatin (LIPITOR) 10 MG tablet Take 1 tablet (10 mg total) by mouth daily. 30 tablet 0  . glimepiride (AMARYL) 2 MG tablet TAKE TWO TABLETS BY MOUTH ONCE DAILY WITH  BREAKFAST 60 tablet 0  . glucose blood (ACCU-CHEK AVIVA PLUS) test strip Use to check blood sugar twice daily.  Dx code E11.9, E11.40. 100 each 5  . Insulin Glargine (LANTUS) 100 UNIT/ML Solostar Pen Inject 10 Units into the skin daily at 10 pm. 15 mL 11  . lisinopril  (PRINIVIL,ZESTRIL) 40 MG tablet Take 1 tablet (40 mg total) by mouth daily. 30 tablet 0  . metFORMIN (GLUCOPHAGE) 1000 MG tablet Take 1 tablet (1,000 mg total) by mouth 2 (two) times daily with a meal. 180 tablet 3  . metoprolol (TOPROL-XL) 200 MG 24 hr tablet Take 1 tablet (200 mg total) by mouth daily. 90 tablet 3  . omeprazole (PRILOSEC) 40 MG capsule Take 1 capsule (40 mg total) by mouth daily. 90 capsule 3  . Potassium Citrate 15 MEQ (1620 MG) TBCR Take 1 tablet by mouth 2 (two) times daily. 120 tablet 0  . ULTICARE MINI PEN NEEDLES 31G X 6 MM MISC USE TWICE DAILY AS NEEDED FOR DOSING OF BYETTA. 100 each 3   No current facility-administered medications for this visit.    Review of Systems Review of Systems Constitutional: negative for fatigue and weight loss Respiratory: negative for cough and wheezing Cardiovascular: negative for chest pain, fatigue and palpitations Gastrointestinal: negative for abdominal pain and change in bowel habits Genitourinary: fishy vaginal discharge Integument/breast: negative for nipple discharge Musculoskeletal:negative for myalgias Neurological: negative for gait problems and tremors Behavioral/Psych: negative for abusive relationship, depression Endocrine: negative for temperature intolerance     Blood pressure 128/80, pulse 80, temperature 98.1 F (  36.7 C), weight 261 lb (118.389 kg).  Physical Exam Physical Exam            General:  Alert and no distress Abdomen:  normal findings: no organomegaly, soft, non-tender and no hernia  Pelvis:  External genitalia: normal general appearance Urinary system: urethral meatus normal and bladder without fullness, nontender Vaginal: normal without tenderness, induration or masses.  Scant, thin vaginal discharge Cervix: absent Adnexa: normal bimanual exam Uterus: absent      Data Reviewed Wet prep  Assessment     BV     Plan    Flagyl Rx and stressed proper vaginal hygiene of not washing deeply  in vagina with soapy wash cloth.  Only vinegar-water douche prn. F/U prn  Orders Placed This Encounter  Procedures  . Urine culture  . NuSwab Vaginitis (VG)  . POCT urinalysis dipstick   No orders of the defined types were placed in this encounter.

## 2015-05-14 NOTE — Patient Instructions (Signed)

## 2015-05-14 NOTE — Telephone Encounter (Signed)
Pt gave verbal ok to text MyChart activation code.

## 2015-05-16 LAB — URINE CULTURE

## 2015-05-18 ENCOUNTER — Telehealth: Payer: Self-pay | Admitting: Internal Medicine

## 2015-05-18 DIAGNOSIS — E785 Hyperlipidemia, unspecified: Secondary | ICD-10-CM

## 2015-05-18 MED ORDER — ATORVASTATIN CALCIUM 40 MG PO TABS: 40.0000 mg | ORAL_TABLET | Freq: Every day | ORAL | Status: DC

## 2015-05-18 NOTE — Telephone Encounter (Signed)
Discussed lab results with Kathryn Crosby. Recommend increasing Atorvastatin 10mg  to 40 mg daily. She still has 15 tablets of 10mg  left. I asked her to take 4 tablets of the 10mg  a day until she runs out. Then I sent Atorvastatin 40mg  to pharmacy; instructed her to take 1 tablet daily after she runs out of the 10mg  tablets. She has a follow up appointment on May 5th for DM. Patient did not have any questions and understood the plan.

## 2015-05-19 ENCOUNTER — Other Ambulatory Visit: Payer: Self-pay | Admitting: Obstetrics

## 2015-05-19 DIAGNOSIS — B373 Candidiasis of vulva and vagina: Secondary | ICD-10-CM

## 2015-05-19 DIAGNOSIS — N760 Acute vaginitis: Principal | ICD-10-CM

## 2015-05-19 DIAGNOSIS — B3731 Acute candidiasis of vulva and vagina: Secondary | ICD-10-CM

## 2015-05-19 DIAGNOSIS — B9689 Other specified bacterial agents as the cause of diseases classified elsewhere: Secondary | ICD-10-CM

## 2015-05-19 LAB — NUSWAB VAGINITIS (VG)
ATOPOBIUM VAGINAE: HIGH {score} — AB
BVAB 2: HIGH Score — AB
CANDIDA ALBICANS, NAA: POSITIVE — AB
CANDIDA GLABRATA, NAA: NEGATIVE
MEGASPHAERA 1: HIGH {score} — AB
Trich vag by NAA: NEGATIVE

## 2015-05-19 MED ORDER — FLUCONAZOLE 150 MG PO TABS: 150.0000 mg | ORAL_TABLET | Freq: Once | ORAL | Status: DC

## 2015-05-26 ENCOUNTER — Telehealth: Payer: Self-pay | Admitting: Vascular Surgery

## 2015-05-26 NOTE — Telephone Encounter (Signed)
sched 3 m f/u for 6/13 at 10:30. Lm on hm# to inform pt of new appt.

## 2015-05-26 NOTE — Telephone Encounter (Signed)
Rolla Flatten, RN  P [facility]            Just noticed Neharika Barboza is on the schedule for 5/2 with [facility]. She has been scheduled for a 2 month fu with [facility] but all insurance companies want 3 months. This appt needs to be rescheduled. Thx!  Kathlee Nations

## 2015-05-29 ENCOUNTER — Ambulatory Visit (INDEPENDENT_AMBULATORY_CARE_PROVIDER_SITE_OTHER): Payer: Medicare Other | Admitting: Family Medicine

## 2015-05-29 ENCOUNTER — Encounter: Payer: Self-pay | Admitting: Family Medicine

## 2015-05-29 VITALS — BP 133/74 | HR 71 | Ht 68.0 in | Wt 258.0 lb

## 2015-05-29 DIAGNOSIS — M1811 Unilateral primary osteoarthritis of first carpometacarpal joint, right hand: Secondary | ICD-10-CM | POA: Diagnosis not present

## 2015-05-31 ENCOUNTER — Other Ambulatory Visit: Payer: Self-pay | Admitting: Internal Medicine

## 2015-06-01 ENCOUNTER — Ambulatory Visit
Admission: RE | Admit: 2015-06-01 | Discharge: 2015-06-01 | Disposition: A | Discharge status: Home / Self Care | Payer: Medicare Other | Source: Ambulatory Visit

## 2015-06-01 DIAGNOSIS — Z1231 Encounter for screening mammogram for malignant neoplasm of breast: Secondary | ICD-10-CM | POA: Diagnosis not present

## 2015-06-01 DIAGNOSIS — M189 Osteoarthritis of first carpometacarpal joint, unspecified: Secondary | ICD-10-CM | POA: Insufficient documentation

## 2015-06-01 NOTE — Telephone Encounter (Signed)
Last seen in clinic last month, please advise.

## 2015-06-01 NOTE — Progress Notes (Signed)
   Subjective:    Patient ID: Kathryn Crosby, female    DOB: March 22, 1960, 55 y.o.   MRN: [id number]  HPI Right thumb sided wrist pain.been going on several months Had MRI Right hand dominant Aching pain, 4-7/10. Worse wt some times--unsure of triggers except activity makes it worse. No hx of thumb injury No thumb surgery   Review of Systems Noted no erythema of thumb or wrist area. No fever, no sweats, no chills.    Objective:   Physical Exam  Vital signs reviewed. GENERAL: Well developed, well nourished, no acute distress WRIST Right: TTP  Thumb CMC and MCP both ttp. FROM in flexion/extension/ opposition but movement is painful.  SKIN: no erythema or lesions right hand VAASC Radial pulses 2+B=  [place]: loss of joint space at MCP and CMC woth some synovial thickening noted CMC. Osteophytes noted both joint spaces.  Reviewed Xrays and MRI. Ganglion cyst on MRI not symptomatic.      Assessment & Plan:  CMC and MCp aarthritis. Discussed options. She wants to try universal thumb splint for a few weeks, might consider injection at future date. F/u 4-5 weeks

## 2015-06-02 ENCOUNTER — Other Ambulatory Visit: Payer: Self-pay | Admitting: Internal Medicine

## 2015-06-02 ENCOUNTER — Ambulatory Visit: Payer: Medicare Other | Admitting: Vascular Surgery

## 2015-06-02 DIAGNOSIS — R928 Other abnormal and inconclusive findings on diagnostic imaging of breast: Secondary | ICD-10-CM

## 2015-06-02 NOTE — Telephone Encounter (Signed)
Notified pt of rx refill

## 2015-06-03 ENCOUNTER — Ambulatory Visit (INDEPENDENT_AMBULATORY_CARE_PROVIDER_SITE_OTHER): Payer: Medicare Other | Admitting: Internal Medicine

## 2015-06-03 ENCOUNTER — Other Ambulatory Visit: Payer: Medicare Other

## 2015-06-03 ENCOUNTER — Encounter: Payer: Self-pay | Admitting: Internal Medicine

## 2015-06-03 VITALS — BP 110/80 | HR 74 | Temp 97.6°F | Ht 68.0 in | Wt 260.4 lb

## 2015-06-03 DIAGNOSIS — Z8639 Personal history of other endocrine, nutritional and metabolic disease: Secondary | ICD-10-CM

## 2015-06-03 DIAGNOSIS — I10 Essential (primary) hypertension: Secondary | ICD-10-CM | POA: Diagnosis not present

## 2015-06-03 DIAGNOSIS — E1142 Type 2 diabetes mellitus with diabetic polyneuropathy: Secondary | ICD-10-CM

## 2015-06-03 MED ORDER — ACCU-CHEK FASTCLIX LANCET KIT: PACK | Status: AC

## 2015-06-03 NOTE — Assessment & Plan Note (Signed)
AM fasting CBGs continue to be elevated.  - Instructed patient to continue to increase 1 unit of Lantus daily for fasting CBG > 110 - prescribed Lancing device per patient request

## 2015-06-03 NOTE — Progress Notes (Signed)
Patient ID: Kathryn Crosby, female   DOB: 05-13-1960, 55 y.o.   MRN: [id number] Date of Visit: 06/03/2015   HPI:  Here for DM follow up, mainly to follow up on her titration of her Lantus.   DM2: - didn't bring list but brought glucometer - going back to Dr. Jenne Campus in the next week or so to discuss nutrition (reports she plans on making an appointment) - no polyuria, or polydypsia  - has been taking Lantus, Amaryl, and Metformin - now up to Lantus 16; patient was told to call when she reached 16 units however she did not call the clinic  - no low CBGs - AM fasting CBGs for the past 9 days were between 184 to 290; mainly in the 200s  - Of note, patient's blood pressure is well controlled today. At home she has a wrist cuff and a regular cuff and she gets BP readings up to 142 for SBP.  - Of note, patient takes Potassium citrate first started by her urologist to help prevent kidney stones.   ROS: See HPI.  [place]:  DM HTN  PHYSICAL EXAM: BP 110/80 mmHg  Pulse 74  Temp(Src) 97.6 F (36.4 C) (Oral)  Ht 5\' 8"  (1.727 m)  Wt 260 lb 6.4 oz (118.117 kg)  BMI 39.60 kg/m2 GEN: NAD  CV: RRR, systolic murmur noted, rubs, or gallops PULM: CTAB, normal effort SKIN: No rash or cyanosis; warm and well-perfused EXTR: No lower extremity edema or calf tenderness  ASSESSMENT/PLAN:  DM (diabetes mellitus), type 2 AM fasting CBGs continue to be elevated.  - Instructed patient to continue to increase 1 unit of Lantus daily for fasting CBG > 110 - prescribed Lancing device per patient request   Potassium Citrate Use: Per patient, this medication is to help prevent kidney stones. Since patient is also on HCTZ and Lisinopril, will check BMP today to ensure electrolytes are stable.    FOLLOW UP: Follow up in 4-5 weeks for blood pressure. Instructed patient to bring cuffs to clinic to check for accuracy.   Smiley Houseman, MD PGY1 [facility]

## 2015-06-03 NOTE — Patient Instructions (Signed)
Thank you for coming in. Continue to do what we discussed with your Lantus.  Increase 1 unit of Lantus every morning for a fasting sugar level above 110 Make sure to write down your glucose number and your blood pressure  Please bring your blood pressure cuffs to clinic the next time you come I will check you electrolyte levels today and will call you Please make a follow up appointment in about 4-5 weeks for your blood pressure

## 2015-06-04 LAB — BASIC METABOLIC PANEL WITH GFR
BUN: 17 mg/dL (ref 7–25)
CHLORIDE: 100 mmol/L (ref 98–110)
CO2: 24 mmol/L (ref 20–31)
CREATININE: 1.04 mg/dL (ref 0.50–1.05)
Calcium: 9.5 mg/dL (ref 8.6–10.4)
GFR, Est African American: 69 mL/min (ref >=60)
GFR, Est Non African American: 60 mL/min (ref >=60)
GLUCOSE: 465 mg/dL — AB (ref 65–99)
POTASSIUM: 3.4 mmol/L — AB (ref 3.5–5.3)
Sodium: 135 mmol/L (ref 135–146)

## 2015-06-07 ENCOUNTER — Telehealth: Payer: Self-pay | Admitting: Internal Medicine

## 2015-06-07 DIAGNOSIS — E1142 Type 2 diabetes mellitus with diabetic polyneuropathy: Secondary | ICD-10-CM

## 2015-06-07 NOTE — Telephone Encounter (Signed)
Called patient to let her know about borderline K in BMP. Recommended that she call her urologist to let him know and ask for recommendations regarding her K supplement. Additionally, encouraged her to continue to stay hydrated while we are titrating her Lantus due to the elevated glucose on BMP. She denies polyuria/polydypsia. Patient understood.

## 2015-06-09 ENCOUNTER — Ambulatory Visit
Admission: RE | Admit: 2015-06-09 | Discharge: 2015-06-09 | Disposition: A | Discharge status: Home / Self Care | Payer: Medicare Other | Source: Ambulatory Visit | Attending: *Deleted | Admitting: *Deleted

## 2015-06-09 ENCOUNTER — Other Ambulatory Visit: Payer: Self-pay | Admitting: Internal Medicine

## 2015-06-09 ENCOUNTER — Other Ambulatory Visit: Payer: Medicare Other

## 2015-06-09 DIAGNOSIS — N63 Unspecified lump in breast: Secondary | ICD-10-CM | POA: Diagnosis not present

## 2015-06-09 DIAGNOSIS — R928 Other abnormal and inconclusive findings on diagnostic imaging of breast: Secondary | ICD-10-CM

## 2015-06-09 DIAGNOSIS — N6310 Unspecified lump in the right breast, unspecified quadrant: Secondary | ICD-10-CM

## 2015-06-10 ENCOUNTER — Ambulatory Visit
Admission: RE | Admit: 2015-06-10 | Discharge: 2015-06-10 | Disposition: A | Discharge status: Home / Self Care | Payer: Medicare Other | Source: Ambulatory Visit | Attending: *Deleted | Admitting: *Deleted

## 2015-06-10 ENCOUNTER — Other Ambulatory Visit: Payer: Self-pay | Admitting: *Deleted

## 2015-06-10 DIAGNOSIS — N6310 Unspecified lump in the right breast, unspecified quadrant: Secondary | ICD-10-CM

## 2015-06-10 DIAGNOSIS — N63 Unspecified lump in breast: Secondary | ICD-10-CM | POA: Diagnosis not present

## 2015-06-10 DIAGNOSIS — N6011 Diffuse cystic mastopathy of right breast: Secondary | ICD-10-CM | POA: Diagnosis not present

## 2015-06-10 MED ORDER — ACCU-CHEK SOFTCLIX LANCETS MISC: Status: DC

## 2015-06-10 NOTE — Telephone Encounter (Signed)
Patient called and states that she is at the pharmacy now and needs clarification on her lancets.  Reviewed chart and sees that patient had prior scripts for BID checking.  Resent script with appropriate directions and diagnosis codes on it. Jazmin Hartsell,CMA

## 2015-06-12 ENCOUNTER — Other Ambulatory Visit: Payer: Self-pay | Admitting: *Deleted

## 2015-06-12 DIAGNOSIS — E118 Type 2 diabetes mellitus with unspecified complications: Secondary | ICD-10-CM

## 2015-06-12 MED ORDER — LANCET DEVICE MISC: Status: AC

## 2015-06-26 ENCOUNTER — Ambulatory Visit: Payer: Medicare Other | Admitting: Family Medicine

## 2015-07-01 ENCOUNTER — Ambulatory Visit: Payer: Medicare Other | Admitting: Internal Medicine

## 2015-07-05 ENCOUNTER — Emergency Department (HOSPITAL_BASED_OUTPATIENT_CLINIC_OR_DEPARTMENT_OTHER)
Admission: EM | Admit: 2015-07-05 | Discharge: 2015-07-05 | Disposition: A | Discharge status: Home / Self Care | Payer: Medicare Other | Attending: Emergency Medicine | Admitting: Emergency Medicine

## 2015-07-05 ENCOUNTER — Encounter (HOSPITAL_BASED_OUTPATIENT_CLINIC_OR_DEPARTMENT_OTHER): Payer: Self-pay

## 2015-07-05 DIAGNOSIS — Z7984 Long term (current) use of oral hypoglycemic drugs: Secondary | ICD-10-CM | POA: Diagnosis not present

## 2015-07-05 DIAGNOSIS — E785 Hyperlipidemia, unspecified: Secondary | ICD-10-CM | POA: Diagnosis not present

## 2015-07-05 DIAGNOSIS — Z79899 Other long term (current) drug therapy: Secondary | ICD-10-CM | POA: Insufficient documentation

## 2015-07-05 DIAGNOSIS — E1165 Type 2 diabetes mellitus with hyperglycemia: Secondary | ICD-10-CM | POA: Diagnosis not present

## 2015-07-05 DIAGNOSIS — I10 Essential (primary) hypertension: Secondary | ICD-10-CM | POA: Diagnosis not present

## 2015-07-05 DIAGNOSIS — R739 Hyperglycemia, unspecified: Secondary | ICD-10-CM

## 2015-07-05 LAB — URINALYSIS, ROUTINE W REFLEX MICROSCOPIC
Bilirubin Urine: NEGATIVE
Glucose, UA: >1000 mg/dL — AB
Hgb urine dipstick: NEGATIVE
KETONES UR: NEGATIVE mg/dL
NITRITE: NEGATIVE
PH: 7 (ref 5.0–8.0)
Protein, ur: NEGATIVE mg/dL
SPECIFIC GRAVITY, URINE: 1.018 (ref 1.005–1.030)

## 2015-07-05 LAB — CBC WITH DIFFERENTIAL/PLATELET
BASOS ABS: 0 10*3/uL (ref 0.0–0.1)
Basophils Relative: 0 %
Eosinophils Absolute: 0.2 10*3/uL (ref 0.0–0.7)
Eosinophils Relative: 2 %
HEMATOCRIT: 39 % (ref 36.0–46.0)
HEMOGLOBIN: 12.8 g/dL (ref 12.0–15.0)
Lymphocytes Relative: 41 %
Lymphs Abs: 3.5 10*3/uL (ref 0.7–4.0)
MCH: 27.2 pg (ref 26.0–34.0)
MCHC: 32.8 g/dL (ref 30.0–36.0)
MCV: 83 fL (ref 78.0–100.0)
MONOS PCT: 7 %
Monocytes Absolute: 0.6 10*3/uL (ref 0.1–1.0)
NEUTROS ABS: 4.3 10*3/uL (ref 1.7–7.7)
NEUTROS PCT: 50 %
Platelets: 298 10*3/uL (ref 150–400)
RBC: 4.7 MIL/uL (ref 3.87–5.11)
RDW: 14.9 % (ref 11.5–15.5)
WBC: 8.6 10*3/uL (ref 4.0–10.5)

## 2015-07-05 LAB — URINE MICROSCOPIC-ADD ON

## 2015-07-05 LAB — BASIC METABOLIC PANEL
ANION GAP: 9 (ref 5–15)
BUN: 12 mg/dL (ref 6–20)
CALCIUM: 8.6 mg/dL — AB (ref 8.9–10.3)
CO2: 26 mmol/L (ref 22–32)
Chloride: 104 mmol/L (ref 101–111)
Creatinine, Ser: 0.81 mg/dL (ref 0.44–1.00)
Glucose, Bld: 318 mg/dL — ABNORMAL HIGH (ref 65–99)
POTASSIUM: 3.7 mmol/L (ref 3.5–5.1)
Sodium: 139 mmol/L (ref 135–145)

## 2015-07-05 LAB — CBG MONITORING, ED
GLUCOSE-CAPILLARY: 253 mg/dL — AB (ref 65–99)
Glucose-Capillary: 312 mg/dL — ABNORMAL HIGH (ref 65–99)

## 2015-07-05 MED ORDER — SODIUM CHLORIDE 0.9 % IV BOLUS (SEPSIS)
1000.0000 mL | Freq: Once | INTRAVENOUS | Status: AC
  Administered 2015-07-05: 1000 mL via INTRAVENOUS

## 2015-07-05 NOTE — ED Provider Notes (Signed)
CSN: [id number]     Arrival date & time 07/05/15  1113 History   First MD Initiated Contact with Patient 07/05/15 1123     Chief Complaint  Patient presents with  . Hyperglycemia    Kathryn Crosby is a 55 y.o. female Who presents the emergency department complaining of hyperglycemia. The patient reports she was walking to church this morning and felt flushed and that her blood sugar was elevated. She checked her blood sugar and reports it was 414. She reports recently over the past week she's had increased blood sugar levels. She reports her blood sugars have been 220 or more when she wakes up in the morning. Usually her blood sugar is around 90-100 in the morning. She takes metformin, Lantus and glimepiride for DM. She's been taking these medicines as prescribed and has not missed any doses. She reports over the past week she's been feeling more fatigued and has been having frequency of urination. She denies any recent illness. She denies fevers, dysuria, abdominal pain, nausea, vomiting, diarrhea, chest pain, shortness of breath, coughing, rashes.   Patient is a 55 y.o. female presenting with hyperglycemia. The history is provided by the patient. No language interpreter was used.  Hyperglycemia Associated symptoms: polyuria   Associated symptoms: no abdominal pain, no chest pain, no dysuria, no fever, no nausea, no shortness of breath, no vomiting and no weakness     Past Medical History  Diagnosis Date  . Diabetes mellitus   . Hypertension   . Hyperlipidemia   . Nephrolithiasis   . GERD (gastroesophageal reflux disease)   . Migraine   . Kidney stones   . Chronic headaches   . Varicose veins 03-30-15    Bilateral Leg   Past Surgical History  Procedure Laterality Date  . Abdominal hysterectomy    . Tonsillectomy    . Left ankle fracture    . Lithotripsy     Family History  Problem Relation Age of Onset  . Coronary artery disease Brother     CABG age 12  . Diabetes Mother   .  Hypertension Mother   . Kidney disease Mother   . Varicose Veins Mother   . Varicose Veins Father    Social History  Substance Use Topics  . Smoking status: Never Smoker   . Smokeless tobacco: Never Used  . Alcohol Use: No   OB History    Gravida Para Term Preterm AB TAB SAB Ectopic Multiple Living   '3 3 3       3     ' Review of Systems  Constitutional: Negative for fever and chills.  HENT: Negative for congestion and sore throat.   Eyes: Negative for visual disturbance.  Respiratory: Negative for cough and shortness of breath.   Cardiovascular: Negative for chest pain.  Gastrointestinal: Negative for nausea, vomiting, abdominal pain and diarrhea.  Endocrine: Positive for polyuria.  Genitourinary: Positive for frequency. Negative for dysuria, urgency, hematuria, decreased urine volume and difficulty urinating.  Musculoskeletal: Negative for myalgias, back pain and neck pain.  Skin: Negative for rash.  Neurological: Negative for syncope, weakness, light-headedness and headaches.      Allergies  Aspirin; Ivp dye; and Shellfish-derived products  Home Medications   Prior to Admission medications   Medication Sig Start Date End Date Taking? Authorizing Provider  ACCU-CHEK SOFTCLIX LANCETS lancets Use to check blood sugar twice daily.  Dx code: E11.9, E11.40 06/10/15  Yes Smiley Houseman, MD  amLODipine (NORVASC) 10 MG tablet Take 1  tablet (10 mg total) by mouth daily. 05/12/15  Yes Smiley Houseman, MD  atorvastatin (LIPITOR) 40 MG tablet Take 1 tablet (40 mg total) by mouth daily. 05/18/15 05/17/16 Yes Smiley Houseman, MD  glimepiride (AMARYL) 2 MG tablet TAKE TWO TABLETS BY MOUTH ONCE DAILY WITH  BREAKFAST 06/01/15  Yes Smiley Houseman, MD  glucose blood (ACCU-CHEK AVIVA PLUS) test strip Use to check blood sugar twice daily.  Dx code E11.9, E11.40. 04/20/15  Yes Zenia Resides, MD  hydrochlorothiazide (HYDRODIURIL) 25 MG tablet Take 25 mg by mouth daily.   Yes  Historical Provider, MD  Insulin Glargine (LANTUS) 100 UNIT/ML Solostar Pen Inject 10 Units into the skin daily at 10 pm. 12/11/14  Yes Smiley Houseman, MD  Lancet Device MISC To be used with accu-check lancets 06/12/15  Yes Smiley Houseman, MD  Lancets Misc. (ACCU-CHEK FASTCLIX LANCET) KIT Please use to check blood sugar 06/03/15  Yes Smiley Houseman, MD  lisinopril (PRINIVIL,ZESTRIL) 40 MG tablet Take 1 tablet (40 mg total) by mouth daily. 05/12/15  Yes Smiley Houseman, MD  metFORMIN (GLUCOPHAGE) 1000 MG tablet Take 1 tablet (1,000 mg total) by mouth 2 (two) times daily with a meal. 01/07/15  Yes Smiley Houseman, MD  metoprolol (TOPROL-XL) 200 MG 24 hr tablet Take 1 tablet (200 mg total) by mouth daily. 05/12/15  Yes Smiley Houseman, MD  omeprazole (PRILOSEC) 40 MG capsule Take 1 capsule (40 mg total) by mouth daily. 12/11/14  Yes Smiley Houseman, MD  Potassium Citrate 15 MEQ (1620 MG) TBCR Take 1 tablet by mouth 2 (two) times daily. 05/12/15  Yes Smiley Houseman, MD  ULTICARE MINI PEN NEEDLES 31G X 6 MM MISC USE TWICE DAILY AS NEEDED FOR DOSING OF BYETTA. 02/10/14  Yes Leone Haven, MD   BP 139/80 mmHg  Pulse 68  Temp(Src) 98 F (36.7 C) (Oral)  Resp 16  Ht '5\' 8"'  (1.727 m)  Wt 116.574 kg  BMI 39.09 kg/m2  SpO2 97% Physical Exam  Constitutional: She is oriented to person, place, and time. She appears well-developed and well-nourished. No distress.  Nontoxic appearing.  HENT:  Head: Normocephalic and atraumatic.  Mouth/Throat: Oropharynx is clear and moist.  Eyes: Conjunctivae are normal. Pupils are equal, round, and reactive to light. Right eye exhibits no discharge. Left eye exhibits no discharge.  Neck: Neck supple. No JVD present.  Cardiovascular: Normal rate, regular rhythm, normal heart sounds and intact distal pulses.  Exam reveals no gallop and no friction rub.   No murmur heard. Pulmonary/Chest: Effort normal and breath sounds normal. No stridor.  No respiratory distress. She has no wheezes. She has no rales.  Lungs clear to auscultation bilaterally.  Abdominal: Soft. There is no tenderness. There is no guarding.  Abdomen is soft and nontender to palpation.  Musculoskeletal: She exhibits no edema.  Lymphadenopathy:    She has no cervical adenopathy.  Neurological: She is alert and oriented to person, place, and time. Coordination normal.  Skin: Skin is warm and dry. No rash noted. She is not diaphoretic. No erythema. No pallor.  Psychiatric: She has a normal mood and affect. Her behavior is normal.  Nursing note and vitals reviewed.   ED Course  Procedures (including critical care time) Labs Review Labs Reviewed  URINALYSIS, ROUTINE W REFLEX MICROSCOPIC (NOT AT [facility]) - Abnormal; Notable for the following:    APPearance CLOUDY (*)    Glucose, UA >1000 (*)    Leukocytes,  UA MODERATE (*)    All other components within normal limits  BASIC METABOLIC PANEL - Abnormal; Notable for the following:    Glucose, Bld 318 (*)    Calcium 8.6 (*)    All other components within normal limits  URINE MICROSCOPIC-ADD ON - Abnormal; Notable for the following:    Squamous Epithelial / LPF 6-30 (*)    Bacteria, UA MANY (*)    All other components within normal limits  CBG MONITORING, ED - Abnormal; Notable for the following:    Glucose-Capillary 312 (*)    All other components within normal limits  CBG MONITORING, ED - Abnormal; Notable for the following:    Glucose-Capillary 253 (*)    All other components within normal limits  CBC WITH DIFFERENTIAL/PLATELET    Imaging Review No results found. I have personally reviewed and evaluated these lab results as part of my medical decision-making.   EKG Interpretation None      Filed Vitals:   07/05/15 1120 07/05/15 1328  BP: 151/90 139/80  Pulse: 69 68  Temp: 98 F (36.7 C)   TempSrc: Oral   Resp: 16 16  Height: '5\' 8"'  (1.727 m)   Weight: 116.574 kg   SpO2: 98% 97%     MDM    Meds given in ED:  Medications  sodium chloride 0.9 % bolus 1,000 mL (0 mLs Intravenous Stopped 07/05/15 1326)    New Prescriptions   No medications on file    Final diagnoses:  Hyperglycemia   This is a 55 y.o. female Who presents the emergency department complaining of hyperglycemia. The patient reports she was walking to church this morning and felt flushed and that her blood sugar was elevated. She checked her blood sugar and reports it was 414. She reports recently over the past week she's had increased blood sugar levels. She reports her blood sugars have been 220 or more when she wakes up in the morning. Usually her blood sugar is around 90-100 in the morning. She takes metformin, Lantus and glimepiride for DM. She's been taking these medicines as prescribed and has not missed any doses. On exam the patient is afebrile and nontoxic-appearing. She is alert and oriented. Initial blood sugar is 318 with normal anion gap. CBC is within normal limits. Urinalysis shows glucosuria. No ketones. Its nitrite negative with moderate leukocytes. Patient reports urinary frequency but no other urinary symptoms. Will send urine for culture. Repeat blood sugar is down to 253. She has normal anion gap. We'll discharge at this time after liter fluid bolus. At reevaluation she reports feeling much better. I discussed that I would like her to follow-up this week with family practice to further manage her diabetes. I discussed strict and specific return precautions. I advised the patient to follow-up with their primary care provider this week. I advised the patient to return to the emergency department with new or worsening symptoms or new concerns. The patient verbalized understanding and agreement with plan.    This patient was discussed with Dr. Oleta Mouse who agrees with assessment and plan.    Waynetta Pean, PA-C 07/05/15 [place], MD 07/06/15 0630

## 2015-07-05 NOTE — ED Notes (Signed)
Pt is on insulin and oral medications for DM. No changes in medication regimen, denies recent infection, denies current symptoms of infection.

## 2015-07-05 NOTE — Discharge Instructions (Signed)
Hyperglycemia °Hyperglycemia occurs when the glucose (sugar) in your blood is too high. Hyperglycemia can happen for many reasons, but it most often happens to people who do not know they have diabetes or are not managing their diabetes properly.  °CAUSES  °Whether you have diabetes or not, there are other causes of hyperglycemia. Hyperglycemia can occur when you have diabetes, but it can also occur in other situations that you might not be as aware of, such as: °Diabetes °· If you have diabetes and are having problems controlling your blood glucose, hyperglycemia could occur because of some of the following reasons: °¨ Not following your meal plan. °¨ Not taking your diabetes medications or not taking it properly. °¨ Exercising less or doing less activity than you normally do. °¨ Being sick. °Pre-diabetes °· This cannot be ignored. Before people develop Type 2 diabetes, they almost always have "pre-diabetes." This is when your blood glucose levels are higher than normal, but not yet high enough to be diagnosed as diabetes. Research has shown that some long-term damage to the body, especially the heart and circulatory system, may already be occurring during pre-diabetes. If you take action to manage your blood glucose when you have pre-diabetes, you may delay or prevent Type 2 diabetes from developing. °Stress °· If you have diabetes, you may be "diet" controlled or on oral medications or insulin to control your diabetes. However, you may find that your blood glucose is higher than usual in the hospital whether you have diabetes or not. This is often referred to as "stress hyperglycemia." Stress can elevate your blood glucose. This happens because of hormones put out by the body during times of stress. If stress has been the cause of your high blood glucose, it can be followed regularly by your caregiver. That way he/she can make sure your hyperglycemia does not continue to get worse or progress to  diabetes. °Steroids °· Steroids are medications that act on the infection fighting system (immune system) to block inflammation or infection. One side effect can be a rise in blood glucose. Most people can produce enough extra insulin to allow for this rise, but for those who cannot, steroids make blood glucose levels go even higher. It is not unusual for steroid treatments to "uncover" diabetes that is developing. It is not always possible to determine if the hyperglycemia will go away after the steroids are stopped. A special blood test called an A1c is sometimes done to determine if your blood glucose was elevated before the steroids were started. °SYMPTOMS °· Thirsty. °· Frequent urination. °· Dry mouth. °· Blurred vision. °· Tired or fatigue. °· Weakness. °· Sleepy. °· Tingling in feet or leg. °DIAGNOSIS  °Diagnosis is made by monitoring blood glucose in one or all of the following ways: °· A1c test. This is a chemical found in your blood. °· Fingerstick blood glucose monitoring. °· Laboratory results. °TREATMENT  °First, knowing the cause of the hyperglycemia is important before the hyperglycemia can be treated. Treatment may include, but is not be limited to: °· Education. °· Change or adjustment in medications. °· Change or adjustment in meal plan. °· Treatment for an illness, infection, etc. °· More frequent blood glucose monitoring. °· Change in exercise plan. °· Decreasing or stopping steroids. °· Lifestyle changes. °HOME CARE INSTRUCTIONS  °· Test your blood glucose as directed. °· Exercise regularly. Your caregiver will give you instructions about exercise. Pre-diabetes or diabetes which comes on with stress is helped by exercising. °· Eat wholesome,   balanced meals. Eat often and at regular, fixed times. Your caregiver or nutritionist will give you a meal plan to guide your sugar intake. °· Being at an ideal weight is important. If needed, losing as little as 10 to 15 pounds may help improve blood  glucose levels. °SEEK MEDICAL CARE IF:  °· You have questions about medicine, activity, or diet. °· You continue to have symptoms (problems such as increased thirst, urination, or weight gain). °SEEK IMMEDIATE MEDICAL CARE IF:  °· You are vomiting or have diarrhea. °· Your breath smells fruity. °· You are breathing faster or slower. °· You are very sleepy or incoherent. °· You have numbness, tingling, or pain in your feet or hands. °· You have chest pain. °· Your symptoms get worse even though you have been following your caregiver's orders. °· If you have any other questions or concerns. °  °This information is not intended to replace advice given to you by your health care provider. Make sure you discuss any questions you have with your health care provider. °  °Document Released: 07/13/2000 Document Revised: 04/11/2011 Document Reviewed: 09/23/2014 °Elsevier Interactive Patient Education ©2016 [employer] ° °

## 2015-07-05 NOTE — ED Notes (Signed)
Pt reports glucose this morning was 414, states she has fatigue, excessive urination, and generalized malaise.

## 2015-07-07 ENCOUNTER — Other Ambulatory Visit: Payer: Self-pay | Admitting: Internal Medicine

## 2015-07-07 LAB — URINE CULTURE

## 2015-07-09 ENCOUNTER — Encounter: Payer: Self-pay | Admitting: Vascular Surgery

## 2015-07-14 ENCOUNTER — Encounter: Payer: Self-pay | Admitting: Vascular Surgery

## 2015-07-14 ENCOUNTER — Other Ambulatory Visit: Payer: Self-pay | Admitting: *Deleted

## 2015-07-14 ENCOUNTER — Ambulatory Visit (INDEPENDENT_AMBULATORY_CARE_PROVIDER_SITE_OTHER): Payer: Medicare Other | Admitting: Vascular Surgery

## 2015-07-14 VITALS — BP 137/88 | HR 70 | Temp 99.1°F | Resp 16 | Wt 261.0 lb

## 2015-07-14 DIAGNOSIS — I83891 Varicose veins of right lower extremities with other complications: Secondary | ICD-10-CM | POA: Diagnosis not present

## 2015-07-14 DIAGNOSIS — I83811 Varicose veins of right lower extremities with pain: Secondary | ICD-10-CM

## 2015-07-14 NOTE — Progress Notes (Signed)
Subjective:     Patient ID: Kathryn Crosby, female   DOB: 12-15-1960, 55 y.o.   MRN: [id number]  HPI this 55 year old female I said date for continued follow-up regarding her painful varicosities and swelling in the right leg. She has tried long-leg elastic compression stockings 20-30 millimeter gradient as well as elevation and ibuprofen but continues to have aching throbbing and burning discomfort in the right leg and chronic swelling. Symptoms are worse as the day progresses. It is affecting her daily living.  Past Medical History  Diagnosis Date  . Diabetes mellitus   . Hypertension   . Hyperlipidemia   . Nephrolithiasis   . GERD (gastroesophageal reflux disease)   . Migraine   . Kidney stones   . Chronic headaches   . Varicose veins 03-30-15    Bilateral Leg    Social History  Substance Use Topics  . Smoking status: Never Smoker   . Smokeless tobacco: Never Used  . Alcohol Use: No    Family History  Problem Relation Age of Onset  . Coronary artery disease Brother     CABG age 24  . Diabetes Mother   . Hypertension Mother   . Kidney disease Mother   . Varicose Veins Mother   . Varicose Veins Father     Allergies  Allergen Reactions  . Aspirin Hives  . Ivp Dye [Iodinated Diagnostic Agents] Hives  . Shellfish-Derived Products Hives and Shortness Of Breath     Current outpatient prescriptions:  .  ACCU-CHEK SOFTCLIX LANCETS lancets, Use to check blood sugar twice daily.  Dx code: E11.9, E11.40, Disp: 100 each, Rfl: 11 .  amLODipine (NORVASC) 10 MG tablet, TAKE ONE TABLET BY MOUTH ONCE DAILY, Disp: 30 tablet, Rfl: 0 .  atorvastatin (LIPITOR) 40 MG tablet, TAKE ONE TABLET BY MOUTH ONCE DAILY, Disp: 30 tablet, Rfl: 0 .  glimepiride (AMARYL) 2 MG tablet, TAKE TWO TABLETS BY MOUTH ONCE DAILY WITH  BREAKFAST, Disp: 60 tablet, Rfl: 0 .  glucose blood (ACCU-CHEK AVIVA PLUS) test strip, Use to check blood sugar twice daily.  Dx code E11.9, E11.40., Disp: 100 each, Rfl: 5 .   hydrochlorothiazide (HYDRODIURIL) 25 MG tablet, Take 25 mg by mouth daily., Disp: , Rfl:  .  Insulin Glargine (LANTUS) 100 UNIT/ML Solostar Pen, Inject 10 Units into the skin daily at 10 pm., Disp: 15 mL, Rfl: 11 .  Lancet Device MISC, To be used with accu-check lancets, Disp: 1 each, Rfl: 0 .  Lancets Misc. (ACCU-CHEK FASTCLIX LANCET) KIT, Please use to check blood sugar, Disp: 1 kit, Rfl: 0 .  lisinopril (PRINIVIL,ZESTRIL) 40 MG tablet, TAKE ONE TABLET BY MOUTH ONCE DAILY, Disp: 30 tablet, Rfl: 0 .  metFORMIN (GLUCOPHAGE) 1000 MG tablet, Take 1 tablet (1,000 mg total) by mouth 2 (two) times daily with a meal., Disp: 180 tablet, Rfl: 3 .  metoprolol (TOPROL-XL) 200 MG 24 hr tablet, Take 1 tablet (200 mg total) by mouth daily., Disp: 90 tablet, Rfl: 3 .  omeprazole (PRILOSEC) 40 MG capsule, Take 1 capsule (40 mg total) by mouth daily., Disp: 90 capsule, Rfl: 3 .  Potassium Citrate 15 MEQ (1620 MG) TBCR, Take 1 tablet by mouth 2 (two) times daily., Disp: 120 tablet, Rfl: 0 .  ULTICARE MINI PEN NEEDLES 31G X 6 MM MISC, USE TWICE DAILY AS NEEDED FOR DOSING OF BYETTA., Disp: 100 each, Rfl: 3  Filed Vitals:   07/14/15 1046  BP: 137/88  Pulse: 70  Temp: 99.1 F (37.3 C)  TempSrc: Oral  Resp: 16  Weight: 261 lb (118.389 kg)  SpO2: 97%    Body mass index is 39.69 kg/(m^2).           Review of Systems denies chest pain, dyspnea on exertion, PND, orthopnea, hemoptysis, claudication     Objective:   Physical Exam BP 137/88 mmHg  Pulse 70  Temp(Src) 99.1 F (37.3 C) (Oral)  Resp 16  Wt 261 lb (118.389 kg)  SpO2 97%  Gen. alert and oriented 3 in no apparent distress Lungs no rhonchi or wheezing Right leg with bulging varicosities in the distal thigh extending across the patella into the pretibial region with spider and reticular veins in the medial thigh and calf. 1+ edema distally. 3+ dorsalis pedis pulse palpable.  Duplex scan was performed last visit and today I confirmed the  findings with sono site-ultrasound at the bedside. 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 Symptoms are affecting patient's daily living and resistant to conservative measures including long-leg elastic compression stockings, elevation, and ibuprofen    Plan:     Patient needs #1 laser ablation right great saphenous vein followed by three-month waiting period to see if she needs stab phlebectomy and sclerotherapy to complete her treatment regimen We will proceed with precertification to perform the laser ablation procedure in the near future

## 2015-07-15 ENCOUNTER — Encounter: Payer: Self-pay | Admitting: Internal Medicine

## 2015-07-15 ENCOUNTER — Ambulatory Visit (INDEPENDENT_AMBULATORY_CARE_PROVIDER_SITE_OTHER): Payer: Medicare Other | Admitting: Internal Medicine

## 2015-07-15 VITALS — BP 131/78 | HR 72 | Temp 98.2°F | Wt 264.0 lb

## 2015-07-15 DIAGNOSIS — E1142 Type 2 diabetes mellitus with diabetic polyneuropathy: Secondary | ICD-10-CM | POA: Diagnosis present

## 2015-07-15 DIAGNOSIS — I10 Essential (primary) hypertension: Secondary | ICD-10-CM | POA: Diagnosis not present

## 2015-07-15 MED ORDER — INSULIN PEN NEEDLE 31G X 8 MM MISC: Status: DC

## 2015-07-15 MED ORDER — HYDROCHLOROTHIAZIDE 25 MG PO TABS: 25.0000 mg | ORAL_TABLET | Freq: Every day | ORAL | Status: DC

## 2015-07-15 NOTE — Progress Notes (Signed)
Patient ID: Kathryn Crosby, female   DOB: 06-14-1960, 55 y.o.   MRN: [id number] Date of Visit: 07/15/2015   HPI:  DM:  - currently on Amaryl, Lantus, and Metformin  - Was instructed to titrate up on Lantus 1 unit per day if AM fasting CBG > 110. Currently on Lantus 18 units (during last visit patient was on 16 units) - CBGs: "200s and up" in the morning fasting - no lows - no polyuria no increased thirst - no n/v/ abdominal pain  - no changes in diet  HTN:  - takes HCTZ PRN; did take HCTZ today  - also on Norvasc 10mg  and Lisinopril 40mg , and Metoprolol 100mg   - Blood pressures: 126/70, 138/80 - no HA, chest pain, SOB, LE edema   ROS: See HPI.  [place]:  DM HTN  PHYSICAL EXAM: BP 131/78 mmHg  Pulse 72  Temp(Src) 98.2 F (36.8 C) (Oral)  Wt 264 lb (119.75 kg)  SpO2 100% GEN: NAD CV: RRR, no murmurs, rubs, or gallops PULM: CTAB, normal effort SKIN: No rash or cyanosis; warm and well-perfused EXTR: No lower extremity edema or calf tenderness NEURO: Awake, alert, no focal deficits grossly, normal speech  ASSESSMENT/PLAN:  DM (diabetes mellitus), type 2 Patient is not titrating Lantus as we last discussed.  - Encouraged to continue with current plan of titrating Lantus 1u/day if AM fasting CBG is > 110. Patient is comfortable administering insulin. Continue Amaryl and Metformin - follow up in two weeks with Dr. Valentina Lucks - continue to stay hydrated   HTN (hypertension) Controlled. BP at goal in clinic. Patient has been taking HCTZ PRN but did take today.  - advised to take HCTZ daily - continue Norvasc and Metoprolol   FOLLOW UP: Follow up with Dr. Valentina Lucks in at least 2 weeks  Smiley Houseman, MD [place]

## 2015-07-15 NOTE — Assessment & Plan Note (Signed)
Controlled. BP at goal in clinic. Patient has been taking HCTZ PRN but did take today.  - advised to take HCTZ daily - continue Norvasc and Metoprolol

## 2015-07-15 NOTE — Assessment & Plan Note (Signed)
Patient is not titrating Lantus as we last discussed.  - Encouraged to continue with current plan of titrating Lantus 1u/day if AM fasting CBG is > 110. Patient is comfortable administering insulin. Continue Amaryl and Metformin - follow up in two weeks with Dr. Valentina Lucks - continue to stay hydrated

## 2015-07-15 NOTE — Patient Instructions (Signed)
Thank you for coming in. Continue to do what we discussed with your Lantus.  Increase 1 unit of Lantus every morning for a fasting sugar level above 110 Please stay hydrated.  Please make an appointment with Dr. Valentina Lucks in 2 weeks or so

## 2015-07-17 ENCOUNTER — Ambulatory Visit (INDEPENDENT_AMBULATORY_CARE_PROVIDER_SITE_OTHER): Payer: Medicare Other | Admitting: Family Medicine

## 2015-07-17 ENCOUNTER — Encounter: Payer: Self-pay | Admitting: Family Medicine

## 2015-07-17 VITALS — BP 131/70 | Ht 68.0 in | Wt 261.0 lb

## 2015-07-17 DIAGNOSIS — M79644 Pain in right finger(s): Secondary | ICD-10-CM | POA: Diagnosis not present

## 2015-07-17 MED ORDER — METHYLPREDNISOLONE ACETATE 40 MG/ML IJ SUSP
40.0000 mg | Freq: Once | INTRAMUSCULAR | Status: AC
  Administered 2015-07-17: 40 mg via INTRA_ARTICULAR

## 2015-07-18 NOTE — Progress Notes (Signed)
   Subjective:    Patient ID: Kathryn Crosby, female    DOB: 1960-07-11, 55 y.o.   MRN: [id number]  HPI Continued pain with right thumb movement. She has been using thumb brace and it helps, esp at night, but still having significant pain with movement, even in brace. Wants to try CSI as we had discussed.   Review of Systems Pertinent review of systems: negative for fever or unusual weight change.     Objective:   Physical Exam  Vital signs reviewed. GENERAL: Well developed, well nourished, no acute distress RIGHT thumb MCP joint deformity noted. TTP. Pain with resisted thumb motion in all planes. Mild TTP at [facility], none at IP joint. SKIN: no rash. Normal temp and color. Nail os normal. VASC radial pulse 2+ B=  INJECTION: Patient was given informed consent, signed copy in the chart. Appropriate time out was taken. Area prepped and draped in usual sterile fashion. 1/2 cc of methylprednisolone 40 mg/ml plus  1/2 cc of 1% lidocaine without epinephrine was injected into the right CMP using a(n) US guided approach. The patient tolerated the procedure well. There were no complications. Post procedure instructions were given.       Assessment & Plan:  CSI injection for right MCP thumb joint.

## 2015-07-18 NOTE — Assessment & Plan Note (Signed)
CSI #1 right mcp thumb. continue brace. F/u prn

## 2015-07-19 DIAGNOSIS — Z79899 Other long term (current) drug therapy: Secondary | ICD-10-CM | POA: Diagnosis not present

## 2015-07-19 DIAGNOSIS — E785 Hyperlipidemia, unspecified: Secondary | ICD-10-CM | POA: Diagnosis not present

## 2015-07-19 DIAGNOSIS — S99921A Unspecified injury of right foot, initial encounter: Secondary | ICD-10-CM | POA: Diagnosis not present

## 2015-07-19 DIAGNOSIS — Z794 Long term (current) use of insulin: Secondary | ICD-10-CM | POA: Diagnosis not present

## 2015-07-19 DIAGNOSIS — Z888 Allergy status to other drugs, medicaments and biological substances status: Secondary | ICD-10-CM | POA: Diagnosis not present

## 2015-07-19 DIAGNOSIS — M79671 Pain in right foot: Secondary | ICD-10-CM | POA: Diagnosis not present

## 2015-07-19 DIAGNOSIS — S8991XA Unspecified injury of right lower leg, initial encounter: Secondary | ICD-10-CM | POA: Diagnosis not present

## 2015-07-19 DIAGNOSIS — K219 Gastro-esophageal reflux disease without esophagitis: Secondary | ICD-10-CM | POA: Diagnosis not present

## 2015-07-19 DIAGNOSIS — I10 Essential (primary) hypertension: Secondary | ICD-10-CM | POA: Diagnosis not present

## 2015-07-19 DIAGNOSIS — E119 Type 2 diabetes mellitus without complications: Secondary | ICD-10-CM | POA: Diagnosis not present

## 2015-07-19 DIAGNOSIS — Z91013 Allergy to seafood: Secondary | ICD-10-CM | POA: Diagnosis not present

## 2015-07-30 ENCOUNTER — Ambulatory Visit (INDEPENDENT_AMBULATORY_CARE_PROVIDER_SITE_OTHER): Payer: Medicare Other | Admitting: Pharmacist

## 2015-07-30 ENCOUNTER — Encounter: Payer: Self-pay | Admitting: Pharmacist

## 2015-07-30 DIAGNOSIS — I10 Essential (primary) hypertension: Secondary | ICD-10-CM

## 2015-07-30 DIAGNOSIS — E1142 Type 2 diabetes mellitus with diabetic polyneuropathy: Secondary | ICD-10-CM | POA: Diagnosis present

## 2015-07-30 MED ORDER — ALBIGLUTIDE 30 MG ~~LOC~~ PEN: 30.0000 mg | PEN_INJECTOR | SUBCUTANEOUS | Status: DC

## 2015-07-30 MED ORDER — DULAGLUTIDE 0.75 MG/0.5ML ~~LOC~~ SOAJ: 0.7500 mg | SUBCUTANEOUS | Status: DC

## 2015-07-30 MED ORDER — CARVEDILOL 25 MG PO TABS: 25.0000 mg | ORAL_TABLET | Freq: Two times a day (BID) | ORAL | Status: DC

## 2015-07-30 NOTE — Addendum Note (Signed)
Addended by: Leavy Cella on: 07/30/2015 03:47 PM   Modules accepted: Orders, Medications

## 2015-07-30 NOTE — Progress Notes (Signed)
Patient ID: Kathryn Crosby, female   DOB: 15-Jan-1961, 56 y.o.   MRN: [id number] Reviewed: Agree with Dr. Graylin Shiver documentation and management.

## 2015-07-30 NOTE — Assessment & Plan Note (Signed)
Hypertension longstanding currently not at goal.  Patient reports adherence with medication. Control is suboptimal due to optimization of medications.  Will change metoprolol to carvedilol 25 mg BID.

## 2015-07-30 NOTE — Patient Instructions (Addendum)
Thank you for coming in today!  I am sending in a prescription for Tanzeum.  Please pick up this prescription and bring it to your visit next week. Do not start Tanzeum until your next appointment with Dr. Valentina Lucks.   Increase Lantus to 24 units daily.  Continue to increase your Lantus dose by 1 units each day if your blood sugar is above 100 mg/dL.    STOP taking metoprolol.  START taking carvedilol 25 mg twice a day.  I have sent you a prescription to [facility] for carvedilol.    Follow up with Dr. Valentina Lucks in one week.

## 2015-07-30 NOTE — Progress Notes (Addendum)
    S:    Patient arrives in good spirits, ambulating without assistance.  Presents for diabetes evaluation, education, and management at the request of Dr. Dallas Schimke. Patient was referred on 07/15/15.  Patient was last seen by Primary Care Provider on 07/15/15.   Patient reports Diabetes was diagnosed in 7 to 8 years.   Patient reports fair adherence with medications.  Patient reports taking all medications, but is not titrating insulin as directed.   Current diabetes medications include:  Lantus 16 units, metformin, glimepiride Current hypertension medications include: amlodipine, HCTZ, lisinopril, metoprolol   Patient denies hypoglycemic events.  Patient reported dietary habits: Eats 3 meals/day - reports eating out a lot Breakfast: toast, OJ ( 6 ounces occasionally), bacon or ham, occasional hashbrown Lunch: grilled chicken, salad, cornbread Dinner: flounder, vegetables  Snacks: fruit, chip,  Drinks: unsweet, water, occasionally OJ with breakfast, soda (coke or pepsi) - once per week   Patient reports nocturia (4-5 times per night).  Patient reports neuropathy. Patient reports visual changes (reports blurry vision).  Reports seeing an eye doctor in the past.   Patient reports self foot exams.   O:  Lab Results  Component Value Date   HGBA1C 9.9 05/12/2015   There were no vitals filed for this visit.  Home fasting CBG: 200s   10 year ASCVD risk: 19.9%.  A/P: Diabetes longstanding currently uncontrolled.  Patient denies hypoglycemic events and is able to verbalize appropriate hypoglycemia management plan. Patient reports adherence with medication, but has not been titrating her Lantus as instructed.  Control is suboptimal due to need to titrate insulin dose and diet.  Increased dose of basal insulin Lantus (insulin glargine) to 24 units daily.  Patient will continue to titrate 1 unit/day if fasting CBGs > 100mg /dl until fasting CBGs reach goal or next visit.  Will start Tanzeum  (albiglutide) 30 mg weekly at her next visit.  Advised patient to pick up prescription of Tanzeum this week and bring to her next appointment.  Next A1C anticipated 2-3 months.    ASCVD risk greater than 7.5%.  Continued atorvastatin 40 mg.  Patient is not currently prescribed aspirin due to history of hives while on aspirin.  Will re-evaluate initiation of clopidogrel at next visit.     Hypertension longstanding currently not at goal.  Patient reports adherence with medication. Control is suboptimal due to optimization of medications.  Will change metoprolol to carvedilol 25 mg BID.  Written patient instructions provided.  Total time in face to face counseling 40 minutes.   Follow up in Pharmacist Clinic Visit in one week.   Patient seen with Elisabeth Most, PharmD Resident.    Contact from pharmacy.  Formulary GLP-1 agonist include Bydureon, Trulicity, or Byetta.  Changed patient's Tanzeum to Trulicity.  New prescription was sent to patient's pharmacy.  Patient to pick up and bring to clinic at next visit.  Contacted patient to update her on medication change.  There was no answer.

## 2015-07-30 NOTE — Assessment & Plan Note (Signed)
Diabetes longstanding currently uncontrolled.  Patient denies hypoglycemic events and is able to verbalize appropriate hypoglycemia management plan. Patient reports adherence with medication, but has not been titrating her Lantus as instructed.  Control is suboptimal due to need to titrate insulin dose and diet.  Increased dose of basal insulin Lantus (insulin glargine) to 24 units daily.  Patient will continue to titrate 1 unit/day if fasting CBGs > 100mg /dl until fasting CBGs reach goal or next visit.  Will start Tanzeum (albiglutide) 30 mg weekly at her next visit.  Advised patient to pick up prescription of Tanzeum this week and bring to her next appointment.  Next A1C anticipated 2-3 months.

## 2015-08-06 ENCOUNTER — Encounter: Payer: Self-pay | Admitting: Pharmacist

## 2015-08-06 ENCOUNTER — Ambulatory Visit (INDEPENDENT_AMBULATORY_CARE_PROVIDER_SITE_OTHER): Payer: Medicare Other | Admitting: Pharmacist

## 2015-08-06 VITALS — BP 130/77 | HR 74 | Wt 256.2 lb

## 2015-08-06 DIAGNOSIS — E1142 Type 2 diabetes mellitus with diabetic polyneuropathy: Secondary | ICD-10-CM | POA: Diagnosis present

## 2015-08-06 NOTE — Patient Instructions (Signed)
Start Trulicity one injection once per week.   Continue Lantus at 20 units once daily.   Decrease your lantus to 18 units if you blood sugars are < 100 in the morning.   Next visit in 4 weeks and we will adjust medicines again at that time.   Schedule Rx Clinic for 4 weeks.

## 2015-08-06 NOTE — Assessment & Plan Note (Addendum)
Diabetes longstanding currently with improve control and prepared to initiate Turlicity today.  Patient denies hypoglycemic events and is able to verbalize appropriate hypoglycemia management plan. Patient reports adherence with medication for Diabetes.  Control is suboptimal due to multiple factors including diet and exercise.Continued basal insulin Lantus (insulin glargine) at 20 units once daily with instruction to reduce to 18 units if AM CGBs are < 100. Initiated a trial of Trulicity (dulaglutide) 0.75mg  (patient has already obtained medication). Patient educated on purpose, proper use and potential adverse effects including nausea.  Following instruction patient verbalized understanding of treatment plan. Will continue glimepiride and metformin at this time. Next A1C anticipated 1-2 months.

## 2015-08-06 NOTE — Progress Notes (Signed)
    S:    Patient arrives in good spirits, ambulating without assistance. Presents for diabetes evaluation, education, and management at the request of Dr. Dallas Schimke. Patient was referred on 07/15/2015.  Patient was last seen by Primary Care Provider on 07/15/2015.    Patient reports adherence with medications.  She has been taking Lantus 20 units daily.   Current diabetes medications include: glimepiride 4mg  daily (2X 2mg ) and metformin 100mg  BID.  Current hypertension medications include: Carvedilol, amlodipine and Lisinopril  Patient denies hypoglycemic events.  Patient reported exercise habits: have improved - she is trying to be more active.    O:  Lab Results  Component Value Date   HGBA1C 9.9 05/12/2015   Filed Vitals:   08/06/15 0928  BP: 130/77  Pulse: 74    Home fasting CBG: lowest reading was this AM: 156 overall she has been ranging from high 100s to low 200s (denies any CBGs > 250 since last appointment.    A/P: Diabetes longstanding currently with improve control and prepared to initiate Turlicity today.  Patient denies hypoglycemic events and is able to verbalize appropriate hypoglycemia management plan. Patient reports adherence with medication for Diabetes.  Control is suboptimal due to multiple factors including diet and exercise.Continued basal insulin Lantus (insulin glargine) at 20 units once daily with instruction to reduce to 18 units if AM CGBs are < 100. Initiated a trial of Trulicity (dulaglutide) 0.75mg  (patient has already obtained medication). Patient educated on purpose, proper use and potential adverse effects including nausea.  Following instruction patient verbalized understanding of treatment plan.  Will continue glimepiride and metformin at this time. Next A1C anticipated 1-2 months.   Hypertension longstanding currently at goal and tolerating carvedilol well after initiation.   Patient reports adherence with medication. No change in therapy today.    Written patient instructions provided.  Total time in face to face counseling 25 minutes.   Follow up in Pharmacist Clinic Visit 4 weeks.

## 2015-08-07 ENCOUNTER — Other Ambulatory Visit: Payer: Self-pay | Admitting: Internal Medicine

## 2015-08-10 NOTE — Progress Notes (Signed)
Patient ID: Kathryn Crosby, female   DOB: 1960-07-14, 55 y.o.   MRN: [id number] Reviewed: Agree with Dr. Graylin Shiver documentation and management.

## 2015-08-13 ENCOUNTER — Encounter: Payer: Self-pay | Admitting: Vascular Surgery

## 2015-08-17 ENCOUNTER — Ambulatory Visit (INDEPENDENT_AMBULATORY_CARE_PROVIDER_SITE_OTHER): Payer: Medicare Other | Admitting: Vascular Surgery

## 2015-08-17 ENCOUNTER — Encounter: Payer: Self-pay | Admitting: Vascular Surgery

## 2015-08-17 VITALS — BP 115/78 | HR 76 | Temp 98.0°F | Resp 16 | Ht 68.0 in | Wt 248.0 lb

## 2015-08-17 DIAGNOSIS — I83891 Varicose veins of right lower extremities with other complications: Secondary | ICD-10-CM | POA: Diagnosis not present

## 2015-08-17 NOTE — Progress Notes (Signed)
Subjective:     Patient ID: Kathryn Crosby, female   DOB: 07/06/1960, 55 y.o.   MRN: [id number]  HPI this 55 year old female had laser ablation of the right great saphenous vein from the proximal calf to near the saphenofemoral junction performed under local tumescent anesthesia for gross reflux with pain and swelling. A total of 2481 J of energy was utilized. She tolerated the procedure well.   Review of Systems     Objective:   Physical Exam BP 115/78 mmHg  Pulse 76  Temp(Src) 98 F (36.7 C)  Resp 16  Ht 5\' 8"  (1.727 m)  Wt 248 lb (112.492 kg)  BMI 37.72 kg/m2  SpO2 100%       Assessment:     Well-tolerated laser ablation right great saphenous vein proximal calf to near the saphenofemoral junction performed under local tumescent anesthesia    Plan:     Return in 1 week for venous duplex exam to confirm closure right great saphenous vein

## 2015-08-17 NOTE — Progress Notes (Signed)
Laser Ablation Procedure    Date: 08/17/2015   Kathryn Crosby DOB:10/16/1960  Consent signed: Yes    Surgeon:  Dr. Nelda Severe. Kellie Simmering  Procedure: Laser Ablation: right Greater Saphenous Vein  BP 115/78 mmHg  Pulse 76  Temp(Src) 98 F (36.7 C)  Resp 16  Ht 5\' 8"  (1.727 m)  Wt 248 lb (112.492 kg)  BMI 37.72 kg/m2  SpO2 100%  Tumescent Anesthesia: 450 cc 0.9% NaCl with 50 cc Lidocaine HCL with 1% Epi and 15 cc 8.4% NaHCO3  Local Anesthesia: 3 cc Lidocaine HCL and NaHCO3 (ratio 2:1)  Pulsed Mode: 15 watts, 555ms delay, 1.0 duration  Total Energy:   2481           Total Pulses: 167               Total Time: 2:45    Patient tolerated procedure well  Notes:   Description of Procedure:  After marking the course of the secondary varicosities, the patient was placed on the operating table in the supine position, and the right leg was prepped and draped in sterile fashion.   Local anesthetic was administered and under ultrasound guidance the saphenous vein was accessed with a micro needle and guide wire; then the mirco puncture sheath was placed.  A guide wire was inserted saphenofemoral junction , followed by a 5 french sheath.  The position of the sheath and then the laser fiber below the junction was confirmed using the ultrasound.  Tumescent anesthesia was administered along the course of the saphenous vein using ultrasound guidance. The patient was placed in Trendelenburg position and protective laser glasses were placed on patient and staff, and the laser was fired at 15 watts continuous mode advancing 1-82mm/second for a total of 2481 joules.    Steri strips were applied to the stab wounds and ABD pads and thigh high compression stockings were applied.  Ace wrap bandages were applied over the phlebectomy sites and at the top of the saphenofemoral junction. Blood loss was less than 15 cc.  The patient ambulated out of the operating room having tolerated the procedure well.

## 2015-08-19 ENCOUNTER — Encounter: Payer: Self-pay | Admitting: Vascular Surgery

## 2015-08-20 ENCOUNTER — Encounter: Payer: Self-pay | Admitting: Vascular Surgery

## 2015-08-21 DIAGNOSIS — N200 Calculus of kidney: Secondary | ICD-10-CM | POA: Diagnosis not present

## 2015-08-21 DIAGNOSIS — Q6102 Congenital multiple renal cysts: Secondary | ICD-10-CM | POA: Diagnosis not present

## 2015-08-24 ENCOUNTER — Ambulatory Visit (INDEPENDENT_AMBULATORY_CARE_PROVIDER_SITE_OTHER): Payer: Medicare Other | Admitting: Vascular Surgery

## 2015-08-24 ENCOUNTER — Encounter: Payer: Self-pay | Admitting: Vascular Surgery

## 2015-08-24 ENCOUNTER — Ambulatory Visit (HOSPITAL_COMMUNITY)
Admission: RE | Admit: 2015-08-24 | Discharge: 2015-08-24 | Disposition: A | Discharge status: Home / Self Care | Payer: Medicare Other | Source: Ambulatory Visit | Attending: Vascular Surgery | Admitting: Vascular Surgery

## 2015-08-24 VITALS — BP 114/80 | HR 79 | Temp 97.5°F | Resp 16 | Ht 68.0 in | Wt 257.0 lb

## 2015-08-24 DIAGNOSIS — I83891 Varicose veins of right lower extremities with other complications: Secondary | ICD-10-CM | POA: Diagnosis not present

## 2015-08-24 DIAGNOSIS — I83811 Varicose veins of right lower extremities with pain: Secondary | ICD-10-CM

## 2015-08-24 DIAGNOSIS — Z9889 Other specified postprocedural states: Secondary | ICD-10-CM | POA: Diagnosis not present

## 2015-08-24 NOTE — Progress Notes (Signed)
Subjective:     Patient ID: Kathryn Crosby, female   DOB: Apr 03, 1960, 55 y.o.   MRN: [id number]  HPI this 55 year old female returns 1 week post-laser ablation right great saphenous vein from the proximal calf to near the saphenofemoral junction performed for painful varicosities in the right leg. She has had mild to moderate discomfort in the medial distal thigh up to the proximal thigh. She has noticed a decrease in the swelling of her right ankle. She is wearing her long leg elastic compression stockings and taking ibuprofen as instructed.  Past Medical History:  Diagnosis Date  . Chronic headaches   . Diabetes mellitus   . GERD (gastroesophageal reflux disease)   . Hyperlipidemia   . Hypertension   . Kidney stones   . Migraine   . Nephrolithiasis   . Varicose veins 03-30-15   Bilateral Leg    Social History  Substance Use Topics  . Smoking status: Never Smoker  . Smokeless tobacco: Never Used  . Alcohol use No    Family History  Problem Relation Age of Onset  . Coronary artery disease Brother     CABG age 27  . Diabetes Mother   . Hypertension Mother   . Kidney disease Mother   . Varicose Veins Mother   . Varicose Veins Father     Allergies  Allergen Reactions  . Aspirin Hives  . Ivp Dye [Iodinated Diagnostic Agents] Hives  . Shellfish-Derived Products Hives and Shortness Of Breath     Current Outpatient Prescriptions:  .  ACCU-CHEK SOFTCLIX LANCETS lancets, Use to check blood sugar twice daily.  Dx code: E11.9, E11.40, Disp: 100 each, Rfl: 11 .  amLODipine (NORVASC) 10 MG tablet, TAKE ONE TABLET BY MOUTH ONCE DAILY, Disp: 30 tablet, Rfl: 0 .  atorvastatin (LIPITOR) 40 MG tablet, TAKE ONE TABLET BY MOUTH ONCE DAILY, Disp: 30 tablet, Rfl: 0 .  carvedilol (COREG) 25 MG tablet, Take 1 tablet (25 mg total) by mouth 2 (two) times daily with a meal., Disp: 60 tablet, Rfl: 3 .  Dulaglutide (TRULICITY) 9.92 EQ/6.8TM SOPN, Inject 0.75 mg into the skin once a week., Disp: 4  pen, Rfl: 3 .  glimepiride (AMARYL) 2 MG tablet, TAKE TWO TABLETS BY MOUTH ONCE DAILY WITH  BREAKFAST, Disp: 60 tablet, Rfl: 0 .  glucose blood (ACCU-CHEK AVIVA PLUS) test strip, Use to check blood sugar twice daily.  Dx code E11.9, E11.40., Disp: 100 each, Rfl: 5 .  hydrochlorothiazide (HYDRODIURIL) 25 MG tablet, Take 1 tablet (25 mg total) by mouth daily., Disp: 30 tablet, Rfl: 2 .  Insulin Glargine (LANTUS) 100 UNIT/ML Solostar Pen, Inject 20 Units into the skin daily., Disp: 15 mL, Rfl: 11 .  Insulin Pen Needle 31G X 8 MM MISC, Use with insulin pen, Disp: 90 each, Rfl: 5 .  Lancet Device MISC, To be used with accu-check lancets, Disp: 1 each, Rfl: 0 .  Lancets Misc. (ACCU-CHEK FASTCLIX LANCET) KIT, Please use to check blood sugar, Disp: 1 kit, Rfl: 0 .  lisinopril (PRINIVIL,ZESTRIL) 40 MG tablet, TAKE ONE TABLET BY MOUTH ONCE DAILY, Disp: 30 tablet, Rfl: 0 .  metFORMIN (GLUCOPHAGE) 1000 MG tablet, Take 1 tablet (1,000 mg total) by mouth 2 (two) times daily with a meal., Disp: 180 tablet, Rfl: 3 .  omeprazole (PRILOSEC) 40 MG capsule, Take 1 capsule (40 mg total) by mouth daily., Disp: 90 capsule, Rfl: 3 .  Potassium Citrate 15 MEQ (1620 MG) TBCR, Take 1 tablet by mouth 2 (two) times  daily., Disp: 120 tablet, Rfl: 0  Vitals:   08/24/15 1026  BP: 114/80  Pulse: 79  Resp: 16  Temp: 97.5 F (36.4 C)  SpO2: 98%  Weight: 257 lb (116.6 kg)  Height: _0  (1.727 m)    Body mass index is 39.08 kg/m.        Review of Systems   denies chest pain, dyspnea on exertion, PND, orthopnea, hemoptysis, claudication  Objective:   Physical Exam BP 114/80 (BP Location: Left Arm, Patient Position: Sitting, Cuff Size: Large)   Pulse 79   Temp 97.5 F (36.4 C)   Resp 16   Ht _1  (1.727 m)   Wt 257 lb (116.6 kg)   SpO2 98%   BMI 39.08 kg/m   Gen. alert and oriented 3 in no apparent distress Lungs no rhonchi or wheezing Cardiovascular regular rhythm no murmurs Right leg with mild  tenderness to deep palpation over great saphenous vein from the knee to near the saphenofemoral junction. 1+ distal edema. Varicosities along anterior thigh and into pretibial region and lateral calf. No active ulcer. 3+ dorsalis pedis pulse palpable.  Today I ordered a venous duplex exam which I reviewed and interpreted. There is no DVT in the right leg. There is total closure of the great saphenous vein in the right leg from the proximal calf to near the saphenofemoral junction.     Assessment:     Successful laser ablation right great saphenous vein for gross reflux with painful varicosities     Plan:     Return in 3 months to determine if stab phlebectomy and/or sclerotherapy will be indicated

## 2015-08-28 DIAGNOSIS — Z9989 Dependence on other enabling machines and devices: Secondary | ICD-10-CM | POA: Diagnosis not present

## 2015-08-28 DIAGNOSIS — Z6839 Body mass index (BMI) 39.0-39.9, adult: Secondary | ICD-10-CM | POA: Diagnosis not present

## 2015-08-28 DIAGNOSIS — G4700 Insomnia, unspecified: Secondary | ICD-10-CM | POA: Diagnosis not present

## 2015-08-28 DIAGNOSIS — G4733 Obstructive sleep apnea (adult) (pediatric): Secondary | ICD-10-CM | POA: Diagnosis not present

## 2015-09-03 ENCOUNTER — Encounter: Payer: Self-pay | Admitting: Pharmacist

## 2015-09-03 ENCOUNTER — Ambulatory Visit (INDEPENDENT_AMBULATORY_CARE_PROVIDER_SITE_OTHER): Payer: Medicare Other | Admitting: Pharmacist

## 2015-09-03 DIAGNOSIS — E1142 Type 2 diabetes mellitus with diabetic polyneuropathy: Secondary | ICD-10-CM

## 2015-09-03 MED ORDER — DULAGLUTIDE 1.5 MG/0.5ML ~~LOC~~ SOAJ: 1.5000 mg | SUBCUTANEOUS | 11 refills | Status: DC

## 2015-09-03 NOTE — Progress Notes (Signed)
Patient ID: Kathryn Crosby, female   DOB: May 20, 1960, 55 y.o.   MRN: [id number] Reviewed: Agree with Dr. Graylin Shiver documentation and management.

## 2015-09-03 NOTE — Assessment & Plan Note (Signed)
Diabetes longstanding, currently uncontrolled (A1C on 05/12/15 = 9.9) but improved per patient-reported FBG 112-150 since addition of Trulicity. Patient denies hypoglycemic events and is able to verbalize appropriate hypoglycemia management plan. Patient reports adherence with medication. Control is suboptimal due to diet. Continued basal insulin Lantus (insulin glargine) at 20 units daily. Increased dose of Trulicity (dulaglutide) to 1.5 mg weekly at next refill - 1 month from now as she just refilled the 0.75 mg dose yesterday.  Continue glimepiride 4 mg daily.  Continued metformin 1000 mg BID.  Next A1C anticipated next visit.  ASCVD risk greater than 7.5%.

## 2015-09-03 NOTE — Progress Notes (Signed)
    S:    Patient arrives in good spirits, ambulating without assistance.  Presents for diabetes evaluation, education, and management at the request of Gunadasa. Patient was referred on 07/15/2015.  Patient was last seen in pharmacy clinic 08/06/15.    Current diabetes medications include: trulicity (dulaglutide) 0.75mg  weekly, metformin 1000mg  BID, glimepiride 4mg  daily (2x2mg ), lantus 20-22 units (depending on FBG, 22 units if >140) Current hypertension medications include: amlodipine 10 mg daily, carvedilol 25 mg BID, HCTZ 25 mg daily, lisinopril 40 mg daily  Patient denies hypoglycemic events since last visit but demonstrates understanding of how to correct hypoglycemic events.   Patient reported dietary habits: Eats 4 meals/day Breakfast: toast w/ eggs, orange, water Lunch:turnip greens, cabbage, unsweet tea, cornbread Drinks:Mostly water, unsweet tea.  Has stopped soda.  She is aware she is not supposed to drink tea due to kidney stones.   Reports has had kidney stones since 1978.  Her last kidney stone was a year ago. She currently has kidney stones in her kidney. She believes her potassium citrate has helped.  Patient reported exercise habits: Walking every day.  Started at 30 minutes but is now at 45 minutes/day for 7 days a week unless it is raining.    Patient reports nocturia 2x/night which has decreased from 4-5x previously Patient denies neuropathy. Patient denies visual changes. Patient reports self foot exams daily.   O:  Lab Results  Component Value Date   HGBA1C 9.9 05/12/2015   There were no vitals filed for this visit.  Home fasting CBG: 110-150 mg/dL   10 year ASCVD risk: 15.7% (last lipid panel 05/12/2015).  A/P: Diabetes longstanding, currently uncontrolled (A1C on 05/12/15 = 9.9) but improved per patient-reported FBG 112-150 since addition of Trulicity. Patient denies hypoglycemic events and is able to verbalize appropriate hypoglycemia management plan. Patient  reports adherence with medication. Control is suboptimal due to diet. Continued basal insulin Lantus (insulin glargine) at 20 units daily. Increased dose of Trulicity (dulaglutide) to 1.5 mg weekly at next refill - 1 month from now as she just refilled the 0.75 mg dose yesterday.  Continue glimepiride 4 mg daily.  Continued metformin 1000 mg BID.  Next A1C anticipated next visit.  ASCVD risk greater than 7.5%.  Continued atorvastatin 40 mg.    Hypertension longstanding/newly diagnosed currently controlled.  Patient reports adherence with medication.   Written patient instructions provided.  Total time in face to face counseling 30  minutes.   Follow up in Pharmacist Clinic Visit 2 months .   Patient seen with Mechele Dawley, PharmD Candidate and Deirdre Pippins, PharmD Resident and Bennye Alm, PharmD Resident.

## 2015-09-03 NOTE — Patient Instructions (Addendum)
Continue Trulicity [id number] mg weekly until you finish your current supply then increase to Trulicity 1.5 mg weekly  Please call our clinic if you have frequent low blood sugars or any other questions.   Followup with Dr Valentina Lucks in 8 weeks

## 2015-09-08 ENCOUNTER — Other Ambulatory Visit: Payer: Self-pay | Admitting: Internal Medicine

## 2015-09-12 ENCOUNTER — Other Ambulatory Visit: Payer: Self-pay | Admitting: Internal Medicine

## 2015-10-11 ENCOUNTER — Other Ambulatory Visit: Payer: Self-pay | Admitting: Internal Medicine

## 2015-10-30 ENCOUNTER — Ambulatory Visit (INDEPENDENT_AMBULATORY_CARE_PROVIDER_SITE_OTHER): Payer: Commercial Managed Care - HMO | Admitting: Obstetrics and Gynecology

## 2015-10-30 ENCOUNTER — Encounter: Payer: Self-pay | Admitting: Obstetrics and Gynecology

## 2015-10-30 VITALS — BP 136/78 | HR 83 | Temp 98.0°F | Ht 68.0 in | Wt 266.6 lb

## 2015-10-30 DIAGNOSIS — M79671 Pain in right foot: Secondary | ICD-10-CM | POA: Diagnosis not present

## 2015-10-30 NOTE — Patient Instructions (Signed)
Unsure of foot pain at this time Please go get foot iamging No red flags or concerning signs at this time Need to wear better foot wear with cushion and protection when feet swell Keep foot elevated, apply ice, use ibuprofen as needed

## 2015-10-30 NOTE — Progress Notes (Signed)
   Subjective:   Patient ID: Kathryn Crosby, female    DOB: Jun 09, 1960, 55 y.o.   MRN: [id number]  Patient presents for Same Day Appointment  Chief Complaint  Patient presents with  . Foot Pain    HPI: # EXTREMITY PAIN Location: right foot Pain started: about a week ago Pain is: stabbing, throbbibng and hurting Severity: 8/10  Medications tried: none Recent trauma: no Similar pain previously: no Can barely get on it and walk Never happened before Getting wrose The more she walks the worse it gets Foot swelling is normal for her No falls  Symptoms Redness: no Swelling: yes Fever: no Weakness: no Rash: no  PMH - significant for painful varicose veins of legs, diabetic neuropathy    Review of Systems   See HPI for ROS.   Smoking status - Never Smoker  Past medical history, surgical, family, and social history reviewed and updated in the EMR as appropriate.  Objective:  Ht 5\' 8"  (1.727 m)   Wt 266 lb 9.6 oz (120.9 kg)   BMI 40.54 kg/m  Vitals and nursing note reviewed  Physical Exam  Constitutional: She is well-developed, well-nourished, and in no distress.  Musculoskeletal:       Right foot: There is bony tenderness and swelling. There is no deformity.       Feet:  Pes planus  Neurological: She has normal sensation, normal strength, normal reflexes and intact cranial nerves. She exhibits normal muscle tone. Gait normal.  Unable to walk on tip-toes on right foot.     Assessment & Plan:  1. Acute foot pain, right Unknown etiology of foot pain at this time. Most likely MSK in nature however could be due to her chronic diabetic neuropathy vs varicose veins which have both in the apst caused pain in leg. No injuries or trauma to foot. Tenderness to palpation in key locations warranting foot imaging. DG foot ordered. Conservative treatment pending foot imaging. Patient to use ibuprofen as needed for pain. Discussed better foot wear for patient to help support  arch. Follow-up in 1-2 weeks with PCP to reexamine.    Orders Placed This Encounter  Procedures  . DG Foot Complete Right    Standing Status:   Future    Standing Expiration Date:   12/29/2016    Order Specific Question:   Reason for Exam (SYMPTOM  OR DIAGNOSIS REQUIRED)    Answer:   foot pain    Order Specific Question:   Is patient pregnant?    Answer:   No    Order Specific Question:   Preferred imaging location?    Answer:   Internal     Luiz Blare, DO 10/30/2015, 10:12 AM PGY-3, [place]

## 2015-11-05 ENCOUNTER — Encounter: Payer: Self-pay | Admitting: Pharmacist

## 2015-11-05 ENCOUNTER — Ambulatory Visit (INDEPENDENT_AMBULATORY_CARE_PROVIDER_SITE_OTHER): Payer: Commercial Managed Care - HMO | Admitting: Pharmacist

## 2015-11-05 DIAGNOSIS — E1142 Type 2 diabetes mellitus with diabetic polyneuropathy: Secondary | ICD-10-CM | POA: Diagnosis not present

## 2015-11-05 DIAGNOSIS — K219 Gastro-esophageal reflux disease without esophagitis: Secondary | ICD-10-CM | POA: Diagnosis not present

## 2015-11-05 MED ORDER — OMEPRAZOLE 20 MG PO CPDR: 20.0000 mg | DELAYED_RELEASE_CAPSULE | Freq: Every day | ORAL | 3 refills | Status: DC

## 2015-11-05 NOTE — Assessment & Plan Note (Signed)
Diabetes longstanding currently controlled with fasting BG < 150. Patient denies hypoglycemic events and is able to verbalize appropriate hypoglycemia management plan. Patient reports adherence with medication. She stated that her goal is to increase exercise and be able to decrease her medications.  Continued basal insulin Lantus (insulin glargine) 18-20 units daily.  Discontinued glimepiride 4mg  daily to prevent hypoglycemic episodes in light of increasing activity. She was instructed to call the clinic if she sees multiple BG < 80.  Next A1C anticipated in 1 month.

## 2015-11-05 NOTE — Assessment & Plan Note (Signed)
History of GERD with use of PRN Omeprazole 40mg  use at least 4 times per week. Discussed that this regimen is not optimized for symptom control due to action of omeprazole being more of a preventative medication instead of quick symptom relief. Switched to omeprazole 20mg  daily for GERD regimen.

## 2015-11-05 NOTE — Patient Instructions (Addendum)
Great to see you today.   Stop taking glimepiride (Amaryl) 2mg . Continue checking blood sugar 1-2 times daily. If you see multiple numbers below 80, please call the clinic. Continue diet and exercise improvements - you're doing great!   Stop taking omeprazole 40mg  as needed, and start taking omeprazole 20mg  daily.   Follow-up appointment in 1 month with Dr. Dallas Schimke.

## 2015-11-05 NOTE — Progress Notes (Addendum)
    S:    Patient arrives in good spirits with her sister, Judson Roch.  Presents for diabetes evaluation, education, and management at the request of Dr. Dallas Schimke. Patient was referred on 07/15/2015.  Patient was last seen by Primary Care Provider on 09/03/15.   Patient reports adherence with medications.  Current diabetes medications include: delaglutide (Trulicity) 1.5mg  weekly, glimepiride 4mg  with breakfast (2x2mg ), metformin 1000mg  bid, insulin glargine (Lantus) 18-20 units daily Current hypertension medications include: amlodipine 10mg  daily, carvedilol 25mg  bid, HCTZ 25mg  daily, lisinopril 40mg  daily  Patient denies hypoglycemic events.  Patient reported dietary habits: she plans on starting meal planning through the gym with frozen meals, understands which foods to avoid, and discussed portion control today  Patient reported exercise habits: she is excited and motivated about going to the gym, her goal is 5 times per week   Patient denies nocturia.  Patient denies neuropathy. Patient denies visual changes. Patient reports self foot exams.   O:  Lab Results  Component Value Date   HGBA1C 9.9 05/12/2015   Vitals:   11/05/15 1012  BP: 128/86  Pulse: 79    Home fasting CBG: 111-171 over the last 2 weeks, with most numbers below 150  2 hour post-prandial/random CBG: she forgot her afternoon meter at home  10 year ASCVD risk: 15.7%  A/P: Diabetes longstanding currently controlled with fasting BG < 150. Patient denies hypoglycemic events and is able to verbalize appropriate hypoglycemia management plan. Patient reports adherence with medication. She stated that her goal is to increase exercise and be able to decrease her medications.  Continued basal insulin Lantus (insulin glargine) 18-20 units daily.  Discontinued glimepiride 4mg  daily to prevent hypoglycemic episodes in light of increasing activity. She was instructed to call the clinic if she sees multiple BG < 80.  Next A1C  anticipated in 1 month.    ASCVD risk greater than 7.5%. Aspirin not appropriate due to allergy and Continued atorvastatin 40 mg. Recommend lipid panel with next lab draw.   Hypertension longstanding currently controlled.  Patient reports adherence with medication.   Patient reports GERD symptoms at least 4 times per week, and takes omeprazole 40mg  as needed. Discussed that this regimen is not optimized for symptom control due to action of omeprazole being more of a preventative medication instead of quick symptom relief. Switched to omeprazole 20mg  daily for GERD regimen.  Written patient instructions provided.  Total time in face to face counseling 30 minutes.   Follow up with PCP in 1 month.   Patient seen with Dr. Valentina Lucks, Belia Heman (PharmD, PGY1 pharmacy resident), and Shonna Chock [facility] pharmacy student).    Completed [employer] - My Health Action Plan with the assistance of attending - Dr. Erin Hearing - to allow patient to start "working out" at [employer].  She plans to begin working out 5 days per week.

## 2015-11-06 NOTE — Progress Notes (Signed)
Patient ID: Kathryn Crosby, female   DOB: 03/01/1960, 55 y.o.   MRN: [id number] Reviewed: Agree with Dr. Graylin Shiver documentation and management.

## 2015-11-13 ENCOUNTER — Other Ambulatory Visit: Payer: Self-pay | Admitting: *Deleted

## 2015-11-13 ENCOUNTER — Other Ambulatory Visit: Payer: Self-pay | Admitting: Internal Medicine

## 2015-11-13 DIAGNOSIS — K219 Gastro-esophageal reflux disease without esophagitis: Secondary | ICD-10-CM

## 2015-11-13 DIAGNOSIS — I10 Essential (primary) hypertension: Secondary | ICD-10-CM

## 2015-11-13 DIAGNOSIS — E1142 Type 2 diabetes mellitus with diabetic polyneuropathy: Secondary | ICD-10-CM

## 2015-11-13 DIAGNOSIS — N6310 Unspecified lump in the right breast, unspecified quadrant: Secondary | ICD-10-CM

## 2015-11-16 ENCOUNTER — Other Ambulatory Visit: Payer: Self-pay | Admitting: Internal Medicine

## 2015-11-16 MED ORDER — DULAGLUTIDE 1.5 MG/0.5ML ~~LOC~~ SOAJ: 1.5000 mg | SUBCUTANEOUS | 11 refills | Status: DC

## 2015-11-16 MED ORDER — AMLODIPINE BESYLATE 10 MG PO TABS: 10.0000 mg | ORAL_TABLET | Freq: Every day | ORAL | 0 refills | Status: DC

## 2015-11-16 MED ORDER — CARVEDILOL 25 MG PO TABS: 25.0000 mg | ORAL_TABLET | Freq: Two times a day (BID) | ORAL | 3 refills | Status: DC

## 2015-11-16 MED ORDER — LISINOPRIL 40 MG PO TABS: 40.0000 mg | ORAL_TABLET | Freq: Every day | ORAL | 0 refills | Status: DC

## 2015-11-16 MED ORDER — ACCU-CHEK AVIVA VI SOLN: 0 refills | Status: DC

## 2015-11-16 MED ORDER — INSULIN PEN NEEDLE 31G X 8 MM MISC: 5 refills | Status: DC

## 2015-11-16 MED ORDER — GLUCOSE BLOOD VI STRP: ORAL_STRIP | 5 refills | Status: DC

## 2015-11-16 MED ORDER — HYDROCHLOROTHIAZIDE 25 MG PO TABS: 25.0000 mg | ORAL_TABLET | Freq: Every day | ORAL | 2 refills | Status: DC

## 2015-11-16 MED ORDER — ACCU-CHEK SOFTCLIX LANCETS MISC: 11 refills | Status: DC

## 2015-11-16 MED ORDER — ATORVASTATIN CALCIUM 40 MG PO TABS: 40.0000 mg | ORAL_TABLET | Freq: Every day | ORAL | 3 refills | Status: DC

## 2015-11-16 MED ORDER — INSULIN GLARGINE 100 UNIT/ML SOLOSTAR PEN: 18.0000 [IU] | PEN_INJECTOR | Freq: Every day | SUBCUTANEOUS | 11 refills | Status: DC

## 2015-11-16 MED ORDER — OMEPRAZOLE 20 MG PO CPDR: 20.0000 mg | DELAYED_RELEASE_CAPSULE | Freq: Every day | ORAL | 3 refills | Status: DC

## 2015-11-16 NOTE — Telephone Encounter (Signed)
2nd request.  Martin, Tamika L, RN  

## 2015-11-20 ENCOUNTER — Encounter: Payer: Self-pay | Admitting: Vascular Surgery

## 2015-11-24 ENCOUNTER — Ambulatory Visit (INDEPENDENT_AMBULATORY_CARE_PROVIDER_SITE_OTHER): Payer: Commercial Managed Care - HMO | Admitting: Vascular Surgery

## 2015-11-24 ENCOUNTER — Telehealth: Payer: Self-pay | Admitting: Internal Medicine

## 2015-11-24 ENCOUNTER — Encounter: Payer: Self-pay | Admitting: Vascular Surgery

## 2015-11-24 VITALS — BP 117/76 | HR 88 | Temp 98.0°F | Resp 16 | Ht 68.0 in | Wt 259.0 lb

## 2015-11-24 DIAGNOSIS — H1130 Conjunctival hemorrhage, unspecified eye: Secondary | ICD-10-CM

## 2015-11-24 DIAGNOSIS — H2510 Age-related nuclear cataract, unspecified eye: Secondary | ICD-10-CM

## 2015-11-24 DIAGNOSIS — H01022 Squamous blepharitis right lower eyelid: Secondary | ICD-10-CM | POA: Diagnosis not present

## 2015-11-24 DIAGNOSIS — E119 Type 2 diabetes mellitus without complications: Secondary | ICD-10-CM

## 2015-11-24 DIAGNOSIS — H01024 Squamous blepharitis left upper eyelid: Secondary | ICD-10-CM | POA: Diagnosis not present

## 2015-11-24 DIAGNOSIS — H01025 Squamous blepharitis left lower eyelid: Secondary | ICD-10-CM | POA: Diagnosis not present

## 2015-11-24 DIAGNOSIS — I83891 Varicose veins of right lower extremities with other complications: Secondary | ICD-10-CM

## 2015-11-24 DIAGNOSIS — H01021 Squamous blepharitis right upper eyelid: Secondary | ICD-10-CM | POA: Diagnosis not present

## 2015-11-24 DIAGNOSIS — H0014 Chalazion left upper eyelid: Secondary | ICD-10-CM | POA: Diagnosis not present

## 2015-11-24 NOTE — Telephone Encounter (Signed)
[facility]: pt has a 1:00 emergency appt and needs a [employer] referral.

## 2015-11-24 NOTE — Telephone Encounter (Signed)
Referral placed in EPIC for documentation

## 2015-11-24 NOTE — Telephone Encounter (Signed)
Dr. Darlina Guys [facility]  NPI [id number], [id number], H25.13, E11.9.  Approved for 6 visits expiring on 05/22/16.  Josem Kaufmann [id number].  Will forward this note to MD to place referral in [facility] for documentation reasons.    Jazmin Hartsell,CMA

## 2015-11-24 NOTE — Progress Notes (Signed)
Subjective:     Patient ID: Kathryn Crosby, female   DOB: 12-30-1960, 55 y.o.   MRN: [id number]  HPI  This 55 year old female returns 3 months post-laser ablation right great saphenous vein for gross reflux with painful varicosities. She states that the swelling has improved but not completely resolved. She does continue to have aching and throbbing discomfort in the varicosities. She is tried long-leg elastic compression stockings 20-30 millimeter gradient as well as elevation and ibuprofen but continues to have the symptoms.  Past Medical History:  Diagnosis Date  . Chronic headaches   . Diabetes mellitus   . GERD (gastroesophageal reflux disease)   . Hyperlipidemia   . Hypertension   . Kidney stones   . Migraine   . Nephrolithiasis   . Varicose veins 03-30-15   Bilateral Leg    Social History  Substance Use Topics  . Smoking status: Never Smoker  . Smokeless tobacco: Never Used  . Alcohol use No    Family History  Problem Relation Age of Onset  . Coronary artery disease Brother     CABG age 63  . Diabetes Mother   . Hypertension Mother   . Kidney disease Mother   . Varicose Veins Mother   . Varicose Veins Father     Allergies  Allergen Reactions  . Aspirin Hives and Shortness Of Breath  . Ivp Dye [Iodinated Diagnostic Agents] Hives and Shortness Of Breath  . Shellfish-Derived Products Hives and Shortness Of Breath     Current Outpatient Prescriptions:  .  ACCU-CHEK SOFTCLIX LANCETS lancets, Use to check blood sugar twice daily.  Dx code: E11.9, E11.40, Disp: 100 each, Rfl: 11 .  amLODipine (NORVASC) 10 MG tablet, Take 1 tablet (10 mg total) by mouth daily., Disp: 30 tablet, Rfl: 0 .  atorvastatin (LIPITOR) 40 MG tablet, Take 1 tablet (40 mg total) by mouth daily., Disp: 30 tablet, Rfl: 3 .  Blood Glucose Calibration (ACCU-CHEK AVIVA) SOLN, Use as directed., Disp: 1 each, Rfl: 0 .  carvedilol (COREG) 25 MG tablet, Take 1 tablet (25 mg total) by mouth 2 (two) times  daily with a meal., Disp: 60 tablet, Rfl: 3 .  Dulaglutide (TRULICITY) 1.5 WU/9.8JX SOPN, Inject 1.5 mg into the skin once a week., Disp: 4 pen, Rfl: 11 .  glucose blood (ACCU-CHEK AVIVA PLUS) test strip, Use to check blood sugar twice daily.  Dx code E11.9, E11.40., Disp: 100 each, Rfl: 5 .  hydrochlorothiazide (HYDRODIURIL) 25 MG tablet, Take 1 tablet (25 mg total) by mouth daily., Disp: 30 tablet, Rfl: 2 .  Insulin Glargine (LANTUS) 100 UNIT/ML Solostar Pen, Inject 18-20 Units into the skin daily., Disp: 15 mL, Rfl: 11 .  Insulin Pen Needle 31G X 8 MM MISC, Use with insulin pen, Disp: 90 each, Rfl: 5 .  Lancet Device MISC, To be used with accu-check lancets, Disp: 1 each, Rfl: 0 .  Lancets Misc. (ACCU-CHEK FASTCLIX LANCET) KIT, Please use to check blood sugar, Disp: 1 kit, Rfl: 0 .  lisinopril (PRINIVIL,ZESTRIL) 40 MG tablet, Take 1 tablet (40 mg total) by mouth daily., Disp: 60 tablet, Rfl: 0 .  metFORMIN (GLUCOPHAGE) 1000 MG tablet, Take 1 tablet (1,000 mg total) by mouth 2 (two) times daily with a meal., Disp: 180 tablet, Rfl: 3 .  omeprazole (PRILOSEC) 20 MG capsule, Take 1 capsule (20 mg total) by mouth daily., Disp: 90 capsule, Rfl: 3 .  Potassium Citrate 15 MEQ (1620 MG) TBCR, TAKE ONE TABLET BY MOUTH TWICE DAILY, Disp:  120 tablet, Rfl: 0  Vitals:   11/24/15 1114  BP: 117/76  Pulse: 88  Resp: 16  Temp: 98 F (36.7 C)  SpO2: 97%  Weight: 259 lb (117.5 kg)  Height: '5\' 8"'  (1.727 m)    Body mass index is 39.38 kg/m.         Review of Systems Denies chest pain, dyspnea on exertion, PND, orthopnea, hemoptysis    Objective:   Physical Exam BP 117/76 (BP Location: Left Arm, Patient Position: Sitting, Cuff Size: Large)   Pulse 88   Temp 98 F (36.7 C)   Resp 16   Ht '5\' 8"'  (1.727 m)   Wt 259 lb (117.5 kg)   SpO2 97%   BMI 39.38 kg/m   Gen. obese female no apparent distress alert and oriented 3 Lungs no rhonchi or wheezing Right leg with bulging varicosities  beginning in the distal thigh extending anteriorly across the patella into the pretibial region. 1+ distal edema with no active ulcer. Spider veins lateral thigh.     Assessment:     Status post laser ablation right great saphenous vein for painful varicosities with improvement but not complete resolution of her symptoms of residual varicosities    Plan:     Patient needs multiple stab phlebectomy-greater than 20 of painful varicosities and sclerotherapy to be arranged in the near future

## 2015-12-03 NOTE — Progress Notes (Signed)
   [facility] Phone: [phone]   Date of Visit: 12/04/2015   HPI:  DM2:  - A1c: 9.9 (05/2015) < 11.2 (12/2014) < 8.6 (07/2014) - Medications: Lantus 18- 20 units (mainly using 18 units), Trulicity 1.5mg  weekly,  Metformin 1000mg  BID. Amaryl discontinued 11/05/15 - CBGs: 100-127; lowest is 80-90s this was in the morning but has not happened in the past 30 days. Highest 147 or 14. She stopped checking twice a day   - Foot Exm: completed today  - Exercise: taking aerobics class, [facility]  - UTD on PNA vaccine - Flu vaccine - gets Rx through [facility]   HTN:  - no chest pain, sob, lightheadedness, dizziness - does not check BP at home - takes all medications as prescribed  ROS: See HPI.  [place]:  Obesity DM2 with neuropathy Pulmonary HTN HLD Migraine Chronic Venous Insufficiency with Varicose Veins OSA OA Kidney Stones  PHYSICAL EXAM: BP 125/81   Pulse 87   Temp 97.8 F (36.6 C) (Oral)   Wt 259 lb (117.5 kg)   SpO2 98%   BMI 39.38 kg/m  GEN: NAD HEENT: Atraumatic, normocephalic, neck supple, EOMI, sclera clear  CV: RRR, no murmurs, rubs, or gallops PULM: CTAB, normal effort SKIN: No rash or cyanosis; warm and well-perfused EXTR: No lower extremity edema or calf tenderness PSYCH: Mood and affect euthymic, normal rate and volume of speech NEURO: Awake, alert, no focal deficits grossly, normal speech  Diabetic Foot Exam - Simple   Simple Foot Form Visual Inspection No deformities, no ulcerations, no other skin breakdown bilaterally:  Yes Sensation Testing Intact to touch and monofilament testing bilaterally:  Yes Pulse Check Comments DP pulses intact      ASSESSMENT/PLAN:   DM (diabetes mellitus), type 2 (HCC) A1c improved but not at goal. Patient is very driven to make healthy lifestyle changes which we discussed. Asked patient to check 2hr post-prandial after largest meal of the day (dinner) and report cbgs.     HTN (hypertension) BP at goal. No lightheadedness. Continue current regimen. BMP at next visit.   Health Maintenance: Declined Flu Vaccine  FOLLOW UP: Follow up ASAP for well woman exam   Smiley Houseman, MD PGY [place]

## 2015-12-04 ENCOUNTER — Ambulatory Visit (INDEPENDENT_AMBULATORY_CARE_PROVIDER_SITE_OTHER): Payer: Commercial Managed Care - HMO | Admitting: Internal Medicine

## 2015-12-04 ENCOUNTER — Encounter: Payer: Self-pay | Admitting: Internal Medicine

## 2015-12-04 VITALS — BP 125/81 | HR 87 | Temp 97.8°F | Wt 259.0 lb

## 2015-12-04 DIAGNOSIS — E1142 Type 2 diabetes mellitus with diabetic polyneuropathy: Secondary | ICD-10-CM

## 2015-12-04 DIAGNOSIS — I10 Essential (primary) hypertension: Secondary | ICD-10-CM

## 2015-12-04 LAB — POCT GLYCOSYLATED HEMOGLOBIN (HGB A1C): Hemoglobin A1C: 8.1

## 2015-12-04 MED ORDER — CARVEDILOL 25 MG PO TABS: 25.0000 mg | ORAL_TABLET | Freq: Two times a day (BID) | ORAL | 0 refills | Status: DC

## 2015-12-04 MED ORDER — LISINOPRIL 40 MG PO TABS: 40.0000 mg | ORAL_TABLET | Freq: Every day | ORAL | 0 refills | Status: DC

## 2015-12-04 MED ORDER — ATORVASTATIN CALCIUM 40 MG PO TABS: 40.0000 mg | ORAL_TABLET | Freq: Every day | ORAL | 0 refills | Status: DC

## 2015-12-04 MED ORDER — ACCU-CHEK AVIVA VI SOLN: 0 refills | Status: DC

## 2015-12-04 MED ORDER — AMLODIPINE BESYLATE 10 MG PO TABS: 10.0000 mg | ORAL_TABLET | Freq: Every day | ORAL | 0 refills | Status: DC

## 2015-12-04 MED ORDER — HYDROCHLOROTHIAZIDE 25 MG PO TABS: 25.0000 mg | ORAL_TABLET | Freq: Every day | ORAL | 0 refills | Status: DC

## 2015-12-04 NOTE — Patient Instructions (Addendum)
  Diet Recommendations for Diabetes   Starchy (carb) foods include: Bread, rice, pasta, potatoes, corn, crackers, bagels, muffins, all baked goods.  (Fruits, milk, and yogurt also have carbohydrate, but most of these foods will not spike your blood sugar as the starchy foods will.)  A few fruits do cause high blood sugars; use small portions of bananas (limit to 1/2 at a time), grapes, and most tropical fruits.    Protein foods include: Meat, fish, poultry, eggs, dairy foods, and beans such as pinto and kidney beans (beans also provide carbohydrate).   1. Eat at least 3 meals and 1-2 snacks per day. Never go more than 4-5 hours while awake without eating.  2. Limit starchy foods to TWO per meal and ONE per snack. ONE portion of a starchy  food is equal to the following:   - ONE slice of bread (or its equivalent, such as half of a hamburger bun).   - 1/2 cup of a "scoopable" starchy food such as potatoes or rice.   - 15 grams of carbohydrate as shown on food label.  3. Both lunch and dinner should include a protein food, a carb food, and vegetables.   - Obtain twice as many veg's as protein or carbohydrate foods for both lunch and dinner.   - Fresh or frozen veg's are best.   - Try to keep frozen veg's on hand for a quick vegetable serving.    4. Breakfast should always include protein.     Please continue to check your sugar in the morning. Check you blood sugar 2 hours after dinner.   Please make an appointment for physical soon.

## 2015-12-04 NOTE — Assessment & Plan Note (Signed)
A1c improved but not at goal. Patient is very driven to make healthy lifestyle changes which we discussed. Asked patient to check 2hr post-prandial after largest meal of the day (dinner) and report cbgs.

## 2015-12-04 NOTE — Assessment & Plan Note (Signed)
BP at goal. No lightheadedness. Continue current regimen. BMP at next visit.

## 2015-12-07 ENCOUNTER — Ambulatory Visit: Payer: Commercial Managed Care - HMO

## 2015-12-10 ENCOUNTER — Ambulatory Visit: Payer: Medicare Other | Admitting: Internal Medicine

## 2015-12-11 DIAGNOSIS — G4733 Obstructive sleep apnea (adult) (pediatric): Secondary | ICD-10-CM | POA: Diagnosis not present

## 2015-12-14 ENCOUNTER — Ambulatory Visit
Admission: RE | Admit: 2015-12-14 | Discharge: 2015-12-14 | Disposition: A | Discharge status: Home / Self Care | Payer: Commercial Managed Care - HMO | Source: Ambulatory Visit | Attending: *Deleted | Admitting: *Deleted

## 2015-12-14 DIAGNOSIS — N6310 Unspecified lump in the right breast, unspecified quadrant: Secondary | ICD-10-CM

## 2015-12-14 DIAGNOSIS — R928 Other abnormal and inconclusive findings on diagnostic imaging of breast: Secondary | ICD-10-CM | POA: Diagnosis not present

## 2015-12-16 ENCOUNTER — Ambulatory Visit (INDEPENDENT_AMBULATORY_CARE_PROVIDER_SITE_OTHER): Payer: Commercial Managed Care - HMO | Admitting: Obstetrics

## 2015-12-16 ENCOUNTER — Encounter: Payer: Self-pay | Admitting: Obstetrics

## 2015-12-16 ENCOUNTER — Encounter: Payer: Self-pay | Admitting: Vascular Surgery

## 2015-12-16 VITALS — BP 102/74 | HR 80 | Temp 98.3°F | Wt 263.8 lb

## 2015-12-16 DIAGNOSIS — B9689 Other specified bacterial agents as the cause of diseases classified elsewhere: Secondary | ICD-10-CM

## 2015-12-16 DIAGNOSIS — E109 Type 1 diabetes mellitus without complications: Secondary | ICD-10-CM | POA: Diagnosis not present

## 2015-12-16 DIAGNOSIS — B373 Candidiasis of vulva and vagina: Secondary | ICD-10-CM

## 2015-12-16 DIAGNOSIS — Z90710 Acquired absence of both cervix and uterus: Secondary | ICD-10-CM

## 2015-12-16 DIAGNOSIS — N760 Acute vaginitis: Secondary | ICD-10-CM

## 2015-12-16 DIAGNOSIS — B3731 Acute candidiasis of vulva and vagina: Secondary | ICD-10-CM

## 2015-12-16 DIAGNOSIS — E139 Other specified diabetes mellitus without complications: Secondary | ICD-10-CM

## 2015-12-16 MED ORDER — FLUCONAZOLE 200 MG PO TABS: 200.0000 mg | ORAL_TABLET | Freq: Every day | ORAL | 5 refills | Status: DC

## 2015-12-16 MED ORDER — CLINDAMYCIN HCL 300 MG PO CAPS: 300.0000 mg | ORAL_CAPSULE | Freq: Three times a day (TID) | ORAL | 2 refills | Status: DC

## 2015-12-16 NOTE — Addendum Note (Signed)
Addended by: Manuela Schwartz C on: 12/16/2015 01:27 PM   Modules accepted: Orders

## 2015-12-16 NOTE — Progress Notes (Signed)
Patient ID: Kathryn Crosby, female   DOB: 1960/10/29, 55 y.o.   MRN: [id number]  Chief Complaint  Patient presents with  . other    Possible yeast infection    HPI Kathryn Crosby is a 55 y.o. female.  Vaginal discharge and itching.  Thinks that she has yeast infection.  H/O Diabetes. HPI  Past Medical History:  Diagnosis Date  . Chronic headaches   . Diabetes mellitus   . GERD (gastroesophageal reflux disease)   . Hyperlipidemia   . Hypertension   . Kidney stones   . Migraine   . Nephrolithiasis   . Varicose veins 03-30-15   Bilateral Leg    Past Surgical History:  Procedure Laterality Date  . ABDOMINAL HYSTERECTOMY    . left ankle fracture    . LITHOTRIPSY    . TONSILLECTOMY      Family History  Problem Relation Age of Onset  . Coronary artery disease Brother     CABG age 79  . Diabetes Mother   . Hypertension Mother   . Kidney disease Mother   . Varicose Veins Mother   . Varicose Veins Father     Social History Social History  Substance Use Topics  . Smoking status: Never Smoker  . Smokeless tobacco: Never Used  . Alcohol use No    Allergies  Allergen Reactions  . Aspirin Hives and Shortness Of Breath  . Ivp Dye [Iodinated Diagnostic Agents] Hives and Shortness Of Breath  . Shellfish-Derived Products Hives and Shortness Of Breath    Current Outpatient Prescriptions  Medication Sig Dispense Refill  . ACCU-CHEK SOFTCLIX LANCETS lancets Use to check blood sugar twice daily.  Dx code: E11.9, E11.40 100 each 11  . amLODipine (NORVASC) 10 MG tablet Take 1 tablet (10 mg total) by mouth daily. 90 tablet 0  . atorvastatin (LIPITOR) 40 MG tablet Take 1 tablet (40 mg total) by mouth daily. 90 tablet 0  . Blood Glucose Calibration (ACCU-CHEK AVIVA) SOLN Use as directed. 1 each 0  . carvedilol (COREG) 25 MG tablet Take 1 tablet (25 mg total) by mouth 2 (two) times daily with a meal. 180 tablet 0  . Dulaglutide (TRULICITY) 1.5 NV/9.57YO SOPN Inject 1.5 mg into the  skin once a week. 4 pen 11  . glucose blood (ACCU-CHEK AVIVA PLUS) test strip Use to check blood sugar twice daily.  Dx code E11.9, E11.40. 100 each 5  . hydrochlorothiazide (HYDRODIURIL) 25 MG tablet Take 1 tablet (25 mg total) by mouth daily. 90 tablet 0  . Insulin Glargine (LANTUS) 100 UNIT/ML Solostar Pen Inject 18-20 Units into the skin daily. 15 mL 11  . Insulin Pen Needle 31G X 8 MM MISC Use with insulin pen 90 each 5  . Lancet Device MISC To be used with accu-check lancets 1 each 0  . Lancets Misc. (ACCU-CHEK FASTCLIX LANCET) KIT Please use to check blood sugar 1 kit 0  . lisinopril (PRINIVIL,ZESTRIL) 40 MG tablet Take 1 tablet (40 mg total) by mouth daily. 90 tablet 0  . metFORMIN (GLUCOPHAGE) 1000 MG tablet Take 1 tablet (1,000 mg total) by mouth 2 (two) times daily with a meal. 180 tablet 3  . omeprazole (PRILOSEC) 20 MG capsule Take 1 capsule (20 mg total) by mouth daily. 90 capsule 3  . Potassium Citrate 15 MEQ (1620 MG) TBCR TAKE ONE TABLET BY MOUTH TWICE DAILY 120 tablet 0  . clindamycin (CLEOCIN) 300 MG capsule Take 1 capsule (300 mg total) by mouth 3 (three) times  daily. 14 capsule 2  . fluconazole (DIFLUCAN) 200 MG tablet Take 1 tablet (200 mg total) by mouth daily. 3 tablet 5   No current facility-administered medications for this visit.     Review of Systems Review of Systems Constitutional: negative for fatigue and weight loss Respiratory: negative for cough and wheezing Cardiovascular: negative for chest pain, fatigue and palpitations Gastrointestinal: negative for abdominal pain and change in bowel habits Genitourinary:negative Integument/breast: negative for nipple discharge Musculoskeletal:negative for myalgias Neurological: negative for gait problems and tremors Behavioral/Psych: negative for abusive relationship, depression Endocrine: negative for temperature intolerance      Blood pressure 102/74, pulse 80, temperature 98.3 F (36.8 C), temperature source  Oral, weight 263 lb 12.8 oz (119.7 kg).  Physical Exam Physical Exam General:   alert  Skin:   no rash or abnormalities  Lungs:   clear to auscultation bilaterally  Heart:   regular rate and rhythm, S1, S2 normal, no murmur, click, rub or gallop  Breasts:   normal without suspicious masses, skin or nipple changes or axillary nodes  Abdomen:  normal findings: no organomegaly, soft, non-tender and no hernia  Pelvis:  External genitalia: normal general appearance Urinary system: urethral meatus normal and bladder without fullness, nontender Vaginal: normal without tenderness, induration or masses.  White discharge, no odor. Cervix: absent Adnexa: normal bimanual exam Uterus: absent    50% of 15 min visit spent on counseling and coordination of care.    Data Reviewed Labs  Assessment     Vaginal Yeast    Plan    Wet prep sent Diflucan Rx F/U in 3 weeks for pap smear and annual  No orders of the defined types were placed in this encounter.  Meds ordered this encounter  Medications  . fluconazole (DIFLUCAN) 200 MG tablet    Sig: Take 1 tablet (200 mg total) by mouth daily.    Dispense:  3 tablet    Refill:  5  . clindamycin (CLEOCIN) 300 MG capsule    Sig: Take 1 capsule (300 mg total) by mouth 3 (three) times daily.    Dispense:  14 capsule    Refill:  2

## 2015-12-18 ENCOUNTER — Other Ambulatory Visit: Payer: Self-pay | Admitting: Internal Medicine

## 2015-12-20 ENCOUNTER — Other Ambulatory Visit: Payer: Self-pay | Admitting: Obstetrics

## 2015-12-20 LAB — NUSWAB BV AND CANDIDA, NAA
CANDIDA ALBICANS, NAA: POSITIVE — AB
Candida glabrata, NAA: NEGATIVE

## 2015-12-21 ENCOUNTER — Other Ambulatory Visit: Payer: Commercial Managed Care - HMO | Admitting: Vascular Surgery

## 2015-12-23 ENCOUNTER — Encounter: Payer: Self-pay | Admitting: Vascular Surgery

## 2015-12-28 ENCOUNTER — Other Ambulatory Visit: Payer: Commercial Managed Care - HMO | Admitting: Vascular Surgery

## 2016-01-04 ENCOUNTER — Ambulatory Visit: Payer: Commercial Managed Care - HMO | Admitting: Vascular Surgery

## 2016-01-04 ENCOUNTER — Ambulatory Visit (INDEPENDENT_AMBULATORY_CARE_PROVIDER_SITE_OTHER): Payer: Commercial Managed Care - HMO | Admitting: Obstetrics

## 2016-01-04 ENCOUNTER — Encounter (HOSPITAL_COMMUNITY): Payer: Commercial Managed Care - HMO

## 2016-01-04 ENCOUNTER — Other Ambulatory Visit: Payer: Self-pay | Admitting: *Deleted

## 2016-01-04 ENCOUNTER — Encounter: Payer: Self-pay | Admitting: Obstetrics

## 2016-01-04 VITALS — BP 122/82 | HR 83 | Temp 98.8°F | Wt 273.7 lb

## 2016-01-04 DIAGNOSIS — N3281 Overactive bladder: Secondary | ICD-10-CM | POA: Diagnosis not present

## 2016-01-04 DIAGNOSIS — R350 Frequency of micturition: Secondary | ICD-10-CM | POA: Diagnosis not present

## 2016-01-04 DIAGNOSIS — I10 Essential (primary) hypertension: Secondary | ICD-10-CM

## 2016-01-04 DIAGNOSIS — Z01419 Encounter for gynecological examination (general) (routine) without abnormal findings: Secondary | ICD-10-CM

## 2016-01-04 DIAGNOSIS — B373 Candidiasis of vulva and vagina: Secondary | ICD-10-CM | POA: Diagnosis not present

## 2016-01-04 DIAGNOSIS — B3731 Acute candidiasis of vulva and vagina: Secondary | ICD-10-CM

## 2016-01-04 DIAGNOSIS — E139 Other specified diabetes mellitus without complications: Secondary | ICD-10-CM

## 2016-01-04 DIAGNOSIS — Z90710 Acquired absence of both cervix and uterus: Secondary | ICD-10-CM

## 2016-01-04 MED ORDER — ACCU-CHEK AVIVA VI SOLN: 0 refills | Status: DC

## 2016-01-04 MED ORDER — LISINOPRIL 40 MG PO TABS: 40.0000 mg | ORAL_TABLET | Freq: Every day | ORAL | 0 refills | Status: DC

## 2016-01-04 MED ORDER — CARVEDILOL 25 MG PO TABS: 25.0000 mg | ORAL_TABLET | Freq: Two times a day (BID) | ORAL | 0 refills | Status: DC

## 2016-01-04 MED ORDER — HYDROCHLOROTHIAZIDE 25 MG PO TABS: 25.0000 mg | ORAL_TABLET | Freq: Every day | ORAL | 0 refills | Status: DC

## 2016-01-04 MED ORDER — ATORVASTATIN CALCIUM 40 MG PO TABS: 40.0000 mg | ORAL_TABLET | Freq: Every day | ORAL | 0 refills | Status: DC

## 2016-01-04 MED ORDER — NITROFURANTOIN MONOHYD MACRO 100 MG PO CAPS: 100.0000 mg | ORAL_CAPSULE | Freq: Two times a day (BID) | ORAL | 2 refills | Status: DC

## 2016-01-04 MED ORDER — AMLODIPINE BESYLATE 10 MG PO TABS: 10.0000 mg | ORAL_TABLET | Freq: Every day | ORAL | 3 refills | Status: DC

## 2016-01-04 NOTE — Addendum Note (Signed)
Addended by: Manuela Schwartz C on: 01/04/2016 02:56 PM   Modules accepted: Orders

## 2016-01-04 NOTE — Progress Notes (Signed)
Subjective:        Kathryn Crosby is a 55 y.o. female here for a routine exam.  Current complaints: Urgency and frequency of urination.  Denies incontinence.   Personal health questionnaire:  Is patient Ashkenazi Jewish, have a family history of breast and/or ovarian cancer: no Is there a family history of uterine cancer diagnosed at age < 94, gastrointestinal cancer, urinary tract cancer, family member who is a Field seismologist syndrome-associated carrier: no Is the patient overweight and hypertensive, family history of diabetes, personal history of gestational diabetes, preeclampsia or PCOS: no Is patient over 60, have PCOS,  family history of premature CHD under age 10, diabetes, smoke, have hypertension or peripheral artery disease:  no At any time, has a partner hit, kicked or otherwise hurt or frightened you?: no Over the past 2 weeks, have you felt down, depressed or hopeless?: no Over the past 2 weeks, have you felt little interest or pleasure in doing things?:no   Gynecologic History No LMP recorded. Patient has had a hysterectomy. Contraception: status post hysterectomy Last Pap: n/a. Results were: n/a Last mammogram: 2017. Results were: normal  Obstetric History OB History  Gravida Para Term Preterm AB Living  _0 SAB TAB Ectopic Multiple Live Births          3    # Outcome Date GA Lbr Len/2nd Weight Sex Delivery Anes PTL Lv  3 Term 09/16/81   8 lb (3.629 kg) F Vag-Spont EPI  LIV  2 Term 11/29/77   7 lb 6 oz (3.345 kg) M Vag-Spont EPI  LIV  1 Term 02/29/76   7 lb 9 oz (3.43 kg) M Vag-Spont EPI  LIV      Past Medical History:  Diagnosis Date  . Chronic headaches   . Diabetes mellitus   . GERD (gastroesophageal reflux disease)   . Hyperlipidemia   . Hypertension   . Kidney stones   . Migraine   . Nephrolithiasis   . Varicose veins 03-30-15   Bilateral Leg    Past Surgical History:  Procedure Laterality Date  . ABDOMINAL HYSTERECTOMY    . left ankle  fracture    . LITHOTRIPSY    . TONSILLECTOMY       Current Outpatient Prescriptions:  .  ACCU-CHEK SOFTCLIX LANCETS lancets, Use to check blood sugar twice daily.  Dx code: E11.9, E11.40, Disp: 100 each, Rfl: 11 .  amLODipine (NORVASC) 10 MG tablet, Take 1 tablet (10 mg total) by mouth daily., Disp: 30 tablet, Rfl: 3 .  atorvastatin (LIPITOR) 40 MG tablet, Take 1 tablet (40 mg total) by mouth daily., Disp: 90 tablet, Rfl: 0 .  Blood Glucose Calibration (ACCU-CHEK AVIVA) SOLN, Use as directed., Disp: 1 each, Rfl: 0 .  carvedilol (COREG) 25 MG tablet, Take 1 tablet (25 mg total) by mouth 2 (two) times daily with a meal., Disp: 180 tablet, Rfl: 0 .  clindamycin (CLEOCIN) 300 MG capsule, Take 1 capsule (300 mg total) by mouth 3 (three) times daily., Disp: 14 capsule, Rfl: 2 .  Dulaglutide (TRULICITY) 1.5 KD/9.8PJ SOPN, Inject 1.5 mg into the skin once a week., Disp: 4 pen, Rfl: 11 .  fluconazole (DIFLUCAN) 200 MG tablet, Take 1 tablet (200 mg total) by mouth daily., Disp: 3 tablet, Rfl: 5 .  glucose blood (ACCU-CHEK AVIVA PLUS) test strip, Use to check blood sugar twice daily.  Dx code E11.9, E11.40., Disp: 100 each, Rfl: 5 .  hydrochlorothiazide (HYDRODIURIL)  25 MG tablet, Take 1 tablet (25 mg total) by mouth daily., Disp: 90 tablet, Rfl: 0 .  Insulin Glargine (LANTUS) 100 UNIT/ML Solostar Pen, Inject 18-20 Units into the skin daily., Disp: 15 mL, Rfl: 11 .  Insulin Pen Needle 31G X 8 MM MISC, Use with insulin pen, Disp: 90 each, Rfl: 5 .  Lancet Device MISC, To be used with accu-check lancets, Disp: 1 each, Rfl: 0 .  Lancets Misc. (ACCU-CHEK FASTCLIX LANCET) KIT, Please use to check blood sugar, Disp: 1 kit, Rfl: 0 .  lisinopril (PRINIVIL,ZESTRIL) 40 MG tablet, Take 1 tablet (40 mg total) by mouth daily., Disp: 90 tablet, Rfl: 0 .  metFORMIN (GLUCOPHAGE) 1000 MG tablet, Take 1 tablet (1,000 mg total) by mouth 2 (two) times daily with a meal., Disp: 180 tablet, Rfl: 3 .  omeprazole (PRILOSEC) 20  MG capsule, Take 1 capsule (20 mg total) by mouth daily., Disp: 90 capsule, Rfl: 3 .  Potassium Citrate 15 MEQ (1620 MG) TBCR, TAKE ONE TABLET BY MOUTH TWICE DAILY, Disp: 120 tablet, Rfl: 0 Allergies  Allergen Reactions  . Aspirin Hives and Shortness Of Breath  . Ivp Dye [Iodinated Diagnostic Agents] Hives and Shortness Of Breath  . Shellfish-Derived Products Hives and Shortness Of Breath    Social History  Substance Use Topics  . Smoking status: Never Smoker  . Smokeless tobacco: Never Used  . Alcohol use No    Family History  Problem Relation Age of Onset  . Diabetes Mother   . Hypertension Mother   . Kidney disease Mother   . Varicose Veins Mother   . Varicose Veins Father   . Coronary artery disease Brother     CABG age 59      Review of Systems  Constitutional: negative for fatigue and weight loss Respiratory: negative for cough and wheezing Cardiovascular: negative for chest pain, fatigue and palpitations Gastrointestinal: negative for abdominal pain and change in bowel habits Musculoskeletal:negative for myalgias Neurological: negative for gait problems and tremors Behavioral/Psych: negative for abusive relationship, depression Endocrine: negative for temperature intolerance    Genitourinary:negative for abnormal menstrual periods, genital lesions, hot flashes, sexual problems and vaginal discharge Integument/breast: negative for breast lump, breast tenderness, nipple discharge and skin lesion(s)    Objective:       BP 122/82   Pulse 83   Temp 98.8 F (37.1 C) (Oral)   Wt 273 lb 11.2 oz (124.1 kg)   BMI 41.62 kg/m  General:   alert  Skin:   no rash or abnormalities  Lungs:   clear to auscultation bilaterally  Heart:   regular rate and rhythm, S1, S2 normal, no murmur, click, rub or gallop  Breasts:   normal without suspicious masses, skin or nipple changes or axillary nodes  Abdomen:  normal findings: no organomegaly, soft, non-tender and no hernia   Pelvis:  External genitalia: normal general appearance Urinary system: urethral meatus normal and bladder without fullness, nontender Vaginal: normal without tenderness, induration or masses Cervix: normal appearance Adnexa: normal bimanual exam Uterus: anteverted and non-tender, normal size   Lab Review Urine pregnancy test Labs reviewed yes Radiologic studies reviewed yes  50% of 20 min visit spent on counseling and coordination of care.    Assessment:    Healthy female exam.    S/P Hysterectomy  OAB   Morbid Obesity Type 2 Diabetes  Candida Vulvitis   Plan:    Education reviewed: calcium supplements, low fat, low cholesterol diet, self breast exams and weight  bearing exercise. Follow up in: 1 year.   No orders of the defined types were placed in this encounter.  No orders of the defined types were placed in this encounter.    Patient ID: Kathryn Crosby, female   DOB: 01/13/1961, 55 y.o.   MRN: [id number]

## 2016-01-04 NOTE — Addendum Note (Signed)
Addended by: Baltazar Najjar A on: 01/04/2016 02:19 PM   Modules accepted: Orders

## 2016-01-04 NOTE — Progress Notes (Signed)
   [facility] Phone: [phone]   Date of Visit: 01/06/2016   HPI:  DM2: A1c: 8.1 (12/04/15) < 9.9 (05/2015) < 11.2 (12/2014) < 8.6 (07/2014) - Medications: Trulicity 1.5mg  (from 0.75mg ) weekly, Metformin 1000mg  BID, Lantus 18 units daily - taking medications as prescribed  - did not bring her sugar log today; by memotry CBGs have been- AM 94, 106, 100.; PM at 5 or 6: 147 (this is 1.5 hrs after meal) - she hasn't been able to go to the gym yet due to family/friend deaths, but is motivated to start  - has been eating out lately due to needing to travel more recently  - Hypoglycemia symptoms:  Lowest cbg  is 88 but she felt symptoms of hypoglycemia. This has occurred only one over the past two months.   HTN: - Medications: Carvedilol, Norvasc, Lisinopril, HCTZ - compliant with medications  - has noticed a cough for the past three month that she thinks is due to Lisinopril. No runny nose or sore throat. A nagging cough all the time. No itchy eyes or watery eyes.  - Prior to Lisinopril, she was on a different ACE which also caused her to cough. Her cough resolved when this was discontinued. Then she was placed on Lisinopril.  - checks BP at home: average 110/90.   ROS: See HPI.  [place]:  Obesity DM2 with neuropathy Pulmonary HTN HLD Migraine Chronic Venous Insufficiency with Varicose Veins OSA OA Kidney Stones  PHYSICAL EXAM: BP 104/80 (BP Location: Right Arm, Patient Position: Sitting, Cuff Size: Normal)   Pulse 89   Temp 97.8 F (36.6 C) (Oral)   Ht 5\' 8"  (1.727 m)   Wt 265 lb 6.4 oz (120.4 kg)   SpO2 96%   BMI 40.35 kg/m  GEN: NAD CV: RRR, systolic murmur noted, no rubs or gallops PULM: CTAB, normal effort SKIN: No rash or cyanosis; warm and well-perfused EXTR: No calf tenderness; some pitting edema bilaterally up to mid shin  PSYCH: Mood and affect euthymic, normal rate and volume of speech NEURO: Awake, alert, no focal deficits grossly,  normal speech Diabetic Foot Exam - Simple   Simple Foot Form Diabetic Foot exam was performed with the following findings:  Yes 01/06/2016  5:37 PM  Visual Inspection No deformities, no ulcerations, no other skin breakdown bilaterally:  Yes Sensation Testing Intact to touch and monofilament testing bilaterally:  Yes Pulse Check Posterior Tibialis and Dorsalis pulse intact bilaterally:  Yes Comments     ASSESSMENT/PLAN:  Health maintenance:  - Flu vaccine today   HTN (hypertension) Very well controlled. Seems to have developed cough due to Lisinopril. Will discontinue Lisinopril. Patient would like to wait and see how her BP is prior to starting a medication to replace Lisinopril. Nurse visit in 1 week to check BP. Continue to check BP at home. Depending on readings will determine if she will need another medication. BMP today   DM (diabetes mellitus), type 2 ([place]) Reported CBGs seemed well controlled. Continue current regimen. Foot exam completed today and is normal. Next A1c due in 03/2016   Smiley Houseman, MD PGY [place]

## 2016-01-04 NOTE — Telephone Encounter (Signed)
Pharmacy was unable to fill rx's printed on 11/3 as they were not signed. Please e-scribe.

## 2016-01-04 NOTE — Telephone Encounter (Signed)
Prescriptions eprescribed.

## 2016-01-06 ENCOUNTER — Ambulatory Visit (INDEPENDENT_AMBULATORY_CARE_PROVIDER_SITE_OTHER): Payer: Commercial Managed Care - HMO | Admitting: Internal Medicine

## 2016-01-06 ENCOUNTER — Encounter: Payer: Self-pay | Admitting: Internal Medicine

## 2016-01-06 VITALS — BP 104/80 | HR 89 | Temp 97.8°F | Ht 68.0 in | Wt 265.4 lb

## 2016-01-06 DIAGNOSIS — Z23 Encounter for immunization: Secondary | ICD-10-CM

## 2016-01-06 DIAGNOSIS — I10 Essential (primary) hypertension: Secondary | ICD-10-CM

## 2016-01-06 DIAGNOSIS — E1142 Type 2 diabetes mellitus with diabetic polyneuropathy: Secondary | ICD-10-CM

## 2016-01-06 LAB — BASIC METABOLIC PANEL WITH GFR
BUN: 13 mg/dL (ref 7–25)
CHLORIDE: 107 mmol/L (ref 98–110)
CO2: 25 mmol/L (ref 20–31)
CREATININE: 0.81 mg/dL (ref 0.50–1.05)
Calcium: 8.3 mg/dL — ABNORMAL LOW (ref 8.6–10.4)
GFR, Est African American: >89 mL/min (ref >=60)
GFR, Est Non African American: 81 mL/min (ref >=60)
Glucose, Bld: 260 mg/dL — ABNORMAL HIGH (ref 65–99)
Potassium: 3.3 mmol/L — ABNORMAL LOW (ref 3.5–5.3)
SODIUM: 143 mmol/L (ref 135–146)

## 2016-01-06 LAB — URINE CULTURE

## 2016-01-06 NOTE — Patient Instructions (Signed)
Let's stop Lisinopril. Continue your other blood pressure medications. Make a nurse visit in 1 week to check blood pressure. You should also check your blood pressure at home daily. Depending on the blood pressure I will call you regarding your follow up visit. We will get blood work today.

## 2016-01-06 NOTE — Assessment & Plan Note (Signed)
Reported CBGs seemed well controlled. Continue current regimen. Foot exam completed today and is normal. Next A1c due in 03/2016

## 2016-01-06 NOTE — Assessment & Plan Note (Addendum)
Very well controlled. Seems to have developed cough due to Lisinopril. Will discontinue Lisinopril. Patient would like to wait and see how her BP is prior to starting a medication to replace Lisinopril. Nurse visit in 1 week to check BP. Continue to check BP at home. Depending on readings will determine if she will need another medication. BMP today

## 2016-01-07 ENCOUNTER — Other Ambulatory Visit: Payer: Self-pay | Admitting: Obstetrics

## 2016-01-07 LAB — NUSWAB BV AND CANDIDA, NAA
Candida albicans, NAA: POSITIVE — AB
Candida glabrata, NAA: NEGATIVE

## 2016-01-08 ENCOUNTER — Telehealth: Payer: Self-pay

## 2016-01-08 NOTE — Telephone Encounter (Signed)
Contacted patient and advised of results and rx sent by provider.

## 2016-01-26 ENCOUNTER — Other Ambulatory Visit: Payer: Self-pay | Admitting: Obstetrics

## 2016-01-26 DIAGNOSIS — R350 Frequency of micturition: Secondary | ICD-10-CM

## 2016-01-27 ENCOUNTER — Ambulatory Visit: Payer: Commercial Managed Care - HMO | Admitting: *Deleted

## 2016-02-09 DIAGNOSIS — R350 Frequency of micturition: Secondary | ICD-10-CM | POA: Diagnosis not present

## 2016-02-09 DIAGNOSIS — R3915 Urgency of urination: Secondary | ICD-10-CM | POA: Diagnosis not present

## 2016-02-09 DIAGNOSIS — N3946 Mixed incontinence: Secondary | ICD-10-CM | POA: Diagnosis not present

## 2016-02-22 DIAGNOSIS — R32 Unspecified urinary incontinence: Secondary | ICD-10-CM | POA: Diagnosis not present

## 2016-02-24 DIAGNOSIS — R32 Unspecified urinary incontinence: Secondary | ICD-10-CM | POA: Diagnosis not present

## 2016-03-01 DIAGNOSIS — N39498 Other specified urinary incontinence: Secondary | ICD-10-CM | POA: Diagnosis not present

## 2016-03-01 DIAGNOSIS — G4719 Other hypersomnia: Secondary | ICD-10-CM | POA: Diagnosis not present

## 2016-03-01 DIAGNOSIS — N3946 Mixed incontinence: Secondary | ICD-10-CM | POA: Diagnosis not present

## 2016-03-01 DIAGNOSIS — Z6841 Body Mass Index (BMI) 40.0 and over, adult: Secondary | ICD-10-CM | POA: Diagnosis not present

## 2016-03-01 DIAGNOSIS — N200 Calculus of kidney: Secondary | ICD-10-CM | POA: Diagnosis not present

## 2016-03-01 DIAGNOSIS — I10 Essential (primary) hypertension: Secondary | ICD-10-CM | POA: Diagnosis not present

## 2016-03-01 DIAGNOSIS — G4733 Obstructive sleep apnea (adult) (pediatric): Secondary | ICD-10-CM | POA: Diagnosis not present

## 2016-03-02 ENCOUNTER — Other Ambulatory Visit: Payer: Self-pay | Admitting: *Deleted

## 2016-03-03 ENCOUNTER — Other Ambulatory Visit: Payer: Self-pay | Admitting: Internal Medicine

## 2016-03-03 MED ORDER — GLUCOSE BLOOD VI STRP: ORAL_STRIP | 5 refills | Status: DC

## 2016-03-03 MED ORDER — ACCU-CHEK AVIVA PLUS W/DEVICE KIT: PACK | 0 refills | Status: DC

## 2016-03-10 DIAGNOSIS — G4733 Obstructive sleep apnea (adult) (pediatric): Secondary | ICD-10-CM | POA: Diagnosis not present

## 2016-03-11 DIAGNOSIS — Z886 Allergy status to analgesic agent status: Secondary | ICD-10-CM | POA: Diagnosis not present

## 2016-03-11 DIAGNOSIS — I10 Essential (primary) hypertension: Secondary | ICD-10-CM | POA: Diagnosis not present

## 2016-03-11 DIAGNOSIS — S81812A Laceration without foreign body, left lower leg, initial encounter: Secondary | ICD-10-CM | POA: Diagnosis not present

## 2016-03-11 DIAGNOSIS — G4733 Obstructive sleep apnea (adult) (pediatric): Secondary | ICD-10-CM | POA: Diagnosis not present

## 2016-03-11 DIAGNOSIS — E119 Type 2 diabetes mellitus without complications: Secondary | ICD-10-CM | POA: Diagnosis not present

## 2016-03-11 DIAGNOSIS — E785 Hyperlipidemia, unspecified: Secondary | ICD-10-CM | POA: Diagnosis not present

## 2016-03-14 NOTE — Progress Notes (Signed)
   [facility] Phone: [phone]   Date of Visit: 03/15/2016   HPI:  Kathryn Crosby is a 56 y.o. female presenting to clinic today for same day appointment. PCP: Smiley Houseman, MD Concerns today include:  - reports that she had a laceration on her left lower extremity on Friday after hitting her leg on her car door - she went to [facility] and had staples placed on it - since then she has had some clear drainage from the site with some minimal bleeding - no fevers, chills, or redness around the site - has been changing her dressing daily  ROS: See HPI.  [place]:  PMH: Obesity DM2 with neuropathy Pulmonary HTN HLD Migraine Chronic Venous Insufficiency with Varicose Veins OSA OA Kidney Stones  PHYSICAL EXAM: BP 124/90   Pulse 98   Temp 98.1 F (36.7 C) (Oral)   Wt 260 lb (117.9 kg)   BMI 39.53 kg/m  Gen: NAD Skin: repaired laceration with 5 staples on the left lower extremity near the mid shin. Skin is not erythematous and is clean. Clear fluid noted from the site. She has trace pitting edema in her lower extremities.   ASSESSMENT/PLAN:  1. Laceration of skin of left lower leg without complication, subsequent encounter - no sign of infection - discussed signs or symptoms of infection  And return precautions - continue daily dressing changes  - return next Wednesday (approximately 12 days after staple placement) for staple removal.    Smiley Houseman, MD PGY [place]

## 2016-03-15 ENCOUNTER — Encounter: Payer: Self-pay | Admitting: Internal Medicine

## 2016-03-15 ENCOUNTER — Ambulatory Visit (INDEPENDENT_AMBULATORY_CARE_PROVIDER_SITE_OTHER): Payer: Medicare HMO | Admitting: Internal Medicine

## 2016-03-15 VITALS — BP 124/90 | HR 98 | Temp 98.1°F | Wt 260.0 lb

## 2016-03-15 DIAGNOSIS — S81812D Laceration without foreign body, left lower leg, subsequent encounter: Secondary | ICD-10-CM | POA: Diagnosis not present

## 2016-03-15 NOTE — Patient Instructions (Signed)
Your wound looks like it is healing well. Please watch out for any signs or symptoms of infection as we discussed. Continue to do daily dressing changes. Please make a nurse visit or you can see me in 10-14 days from when you got your staples (next Wednesday) so we can remove them.

## 2016-03-21 DIAGNOSIS — R600 Localized edema: Secondary | ICD-10-CM | POA: Diagnosis not present

## 2016-03-21 DIAGNOSIS — S8012XA Contusion of left lower leg, initial encounter: Secondary | ICD-10-CM | POA: Diagnosis not present

## 2016-03-21 DIAGNOSIS — I10 Essential (primary) hypertension: Secondary | ICD-10-CM | POA: Diagnosis not present

## 2016-03-21 DIAGNOSIS — E119 Type 2 diabetes mellitus without complications: Secondary | ICD-10-CM | POA: Diagnosis not present

## 2016-03-21 DIAGNOSIS — Z90710 Acquired absence of both cervix and uterus: Secondary | ICD-10-CM | POA: Diagnosis not present

## 2016-03-21 DIAGNOSIS — G4719 Other hypersomnia: Secondary | ICD-10-CM | POA: Diagnosis not present

## 2016-03-21 DIAGNOSIS — Z6841 Body Mass Index (BMI) 40.0 and over, adult: Secondary | ICD-10-CM | POA: Diagnosis not present

## 2016-03-21 DIAGNOSIS — M79605 Pain in left leg: Secondary | ICD-10-CM | POA: Diagnosis not present

## 2016-03-21 DIAGNOSIS — G4733 Obstructive sleep apnea (adult) (pediatric): Secondary | ICD-10-CM | POA: Diagnosis not present

## 2016-03-21 DIAGNOSIS — Z4802 Encounter for removal of sutures: Secondary | ICD-10-CM | POA: Diagnosis not present

## 2016-03-22 DIAGNOSIS — H2513 Age-related nuclear cataract, bilateral: Secondary | ICD-10-CM | POA: Diagnosis not present

## 2016-03-22 DIAGNOSIS — H01021 Squamous blepharitis right upper eyelid: Secondary | ICD-10-CM | POA: Diagnosis not present

## 2016-03-22 DIAGNOSIS — H01024 Squamous blepharitis left upper eyelid: Secondary | ICD-10-CM | POA: Diagnosis not present

## 2016-03-22 DIAGNOSIS — H01022 Squamous blepharitis right lower eyelid: Secondary | ICD-10-CM | POA: Diagnosis not present

## 2016-03-22 DIAGNOSIS — E113292 Type 2 diabetes mellitus with mild nonproliferative diabetic retinopathy without macular edema, left eye: Secondary | ICD-10-CM | POA: Diagnosis not present

## 2016-03-22 DIAGNOSIS — H01025 Squamous blepharitis left lower eyelid: Secondary | ICD-10-CM | POA: Diagnosis not present

## 2016-03-24 DIAGNOSIS — Z8249 Family history of ischemic heart disease and other diseases of the circulatory system: Secondary | ICD-10-CM | POA: Insufficient documentation

## 2016-03-24 NOTE — Progress Notes (Deleted)
   [facility] Phone: [phone]   Date of Visit: 03/25/2016   HPI:  ***  ROS: See HPI.  [place]: PMH: Obesity DM2 with neuropathy Pulmonary HTN HLD Migraine Chronic Venous Insufficiency with Varicose Veins OSA OA Kidney Stones Family history of early heart disease  PHYSICAL EXAM: There were no vitals taken for this visit. Gen: *** HEENT: *** Heart: *** Lungs: *** Neuro: *** Ext: ***  ASSESSMENT/PLAN:  Health maintenance:  -***  No problem-specific Assessment & Plan notes found for this encounter.  FOLLOW UP: Follow up in *** for ***  Smiley Houseman, MD PGY [place]

## 2016-03-25 ENCOUNTER — Ambulatory Visit: Payer: Medicare HMO | Admitting: Internal Medicine

## 2016-04-01 DIAGNOSIS — N39498 Other specified urinary incontinence: Secondary | ICD-10-CM | POA: Diagnosis not present

## 2016-04-01 DIAGNOSIS — N200 Calculus of kidney: Secondary | ICD-10-CM | POA: Diagnosis not present

## 2016-04-01 DIAGNOSIS — I10 Essential (primary) hypertension: Secondary | ICD-10-CM | POA: Diagnosis not present

## 2016-04-01 DIAGNOSIS — N3946 Mixed incontinence: Secondary | ICD-10-CM | POA: Diagnosis not present

## 2016-04-04 DIAGNOSIS — E119 Type 2 diabetes mellitus without complications: Secondary | ICD-10-CM | POA: Diagnosis not present

## 2016-04-05 DIAGNOSIS — E119 Type 2 diabetes mellitus without complications: Secondary | ICD-10-CM | POA: Diagnosis not present

## 2016-04-06 DIAGNOSIS — E119 Type 2 diabetes mellitus without complications: Secondary | ICD-10-CM | POA: Diagnosis not present

## 2016-04-06 DIAGNOSIS — G4733 Obstructive sleep apnea (adult) (pediatric): Secondary | ICD-10-CM | POA: Diagnosis not present

## 2016-04-07 DIAGNOSIS — E119 Type 2 diabetes mellitus without complications: Secondary | ICD-10-CM | POA: Diagnosis not present

## 2016-04-08 DIAGNOSIS — E119 Type 2 diabetes mellitus without complications: Secondary | ICD-10-CM | POA: Diagnosis not present

## 2016-04-09 DIAGNOSIS — E119 Type 2 diabetes mellitus without complications: Secondary | ICD-10-CM | POA: Diagnosis not present

## 2016-04-10 DIAGNOSIS — E119 Type 2 diabetes mellitus without complications: Secondary | ICD-10-CM | POA: Diagnosis not present

## 2016-04-11 ENCOUNTER — Other Ambulatory Visit: Payer: Self-pay | Admitting: Internal Medicine

## 2016-04-11 DIAGNOSIS — E119 Type 2 diabetes mellitus without complications: Secondary | ICD-10-CM | POA: Diagnosis not present

## 2016-04-12 DIAGNOSIS — E119 Type 2 diabetes mellitus without complications: Secondary | ICD-10-CM | POA: Diagnosis not present

## 2016-04-12 NOTE — Telephone Encounter (Signed)
Pharmacy calling to request refill of:  Name of Medication(s):  Lisinopril, atorvastatin  Last date of OV:  03/15/16 Pharmacy:  [facility]  Jazmin Hartsell,CMA

## 2016-04-13 DIAGNOSIS — E119 Type 2 diabetes mellitus without complications: Secondary | ICD-10-CM | POA: Diagnosis not present

## 2016-04-14 DIAGNOSIS — E119 Type 2 diabetes mellitus without complications: Secondary | ICD-10-CM | POA: Diagnosis not present

## 2016-04-15 DIAGNOSIS — E119 Type 2 diabetes mellitus without complications: Secondary | ICD-10-CM | POA: Diagnosis not present

## 2016-04-16 DIAGNOSIS — E119 Type 2 diabetes mellitus without complications: Secondary | ICD-10-CM | POA: Diagnosis not present

## 2016-04-17 DIAGNOSIS — E119 Type 2 diabetes mellitus without complications: Secondary | ICD-10-CM | POA: Diagnosis not present

## 2016-04-18 DIAGNOSIS — E119 Type 2 diabetes mellitus without complications: Secondary | ICD-10-CM | POA: Diagnosis not present

## 2016-04-19 DIAGNOSIS — E119 Type 2 diabetes mellitus without complications: Secondary | ICD-10-CM | POA: Diagnosis not present

## 2016-04-20 DIAGNOSIS — E119 Type 2 diabetes mellitus without complications: Secondary | ICD-10-CM | POA: Diagnosis not present

## 2016-04-21 DIAGNOSIS — E119 Type 2 diabetes mellitus without complications: Secondary | ICD-10-CM | POA: Diagnosis not present

## 2016-04-22 DIAGNOSIS — E119 Type 2 diabetes mellitus without complications: Secondary | ICD-10-CM | POA: Diagnosis not present

## 2016-04-23 DIAGNOSIS — E119 Type 2 diabetes mellitus without complications: Secondary | ICD-10-CM | POA: Diagnosis not present

## 2016-04-24 DIAGNOSIS — E119 Type 2 diabetes mellitus without complications: Secondary | ICD-10-CM | POA: Diagnosis not present

## 2016-04-25 DIAGNOSIS — E119 Type 2 diabetes mellitus without complications: Secondary | ICD-10-CM | POA: Diagnosis not present

## 2016-04-26 DIAGNOSIS — E119 Type 2 diabetes mellitus without complications: Secondary | ICD-10-CM | POA: Diagnosis not present

## 2016-04-27 ENCOUNTER — Encounter: Payer: Self-pay | Admitting: Internal Medicine

## 2016-04-27 ENCOUNTER — Ambulatory Visit (INDEPENDENT_AMBULATORY_CARE_PROVIDER_SITE_OTHER): Payer: Medicare HMO | Admitting: Internal Medicine

## 2016-04-27 VITALS — BP 125/79 | HR 91 | Temp 98.3°F | Ht 68.0 in | Wt 260.0 lb

## 2016-04-27 DIAGNOSIS — E119 Type 2 diabetes mellitus without complications: Secondary | ICD-10-CM | POA: Diagnosis not present

## 2016-04-27 DIAGNOSIS — E785 Hyperlipidemia, unspecified: Secondary | ICD-10-CM | POA: Diagnosis not present

## 2016-04-27 DIAGNOSIS — R079 Chest pain, unspecified: Secondary | ICD-10-CM | POA: Diagnosis not present

## 2016-04-27 DIAGNOSIS — Z794 Long term (current) use of insulin: Secondary | ICD-10-CM | POA: Diagnosis not present

## 2016-04-27 DIAGNOSIS — E1149 Type 2 diabetes mellitus with other diabetic neurological complication: Secondary | ICD-10-CM | POA: Diagnosis not present

## 2016-04-27 DIAGNOSIS — E1142 Type 2 diabetes mellitus with diabetic polyneuropathy: Secondary | ICD-10-CM

## 2016-04-27 DIAGNOSIS — Z886 Allergy status to analgesic agent status: Secondary | ICD-10-CM | POA: Diagnosis not present

## 2016-04-27 DIAGNOSIS — I10 Essential (primary) hypertension: Secondary | ICD-10-CM

## 2016-04-27 DIAGNOSIS — Z90710 Acquired absence of both cervix and uterus: Secondary | ICD-10-CM | POA: Diagnosis not present

## 2016-04-27 DIAGNOSIS — Z91041 Radiographic dye allergy status: Secondary | ICD-10-CM | POA: Diagnosis not present

## 2016-04-27 DIAGNOSIS — K219 Gastro-esophageal reflux disease without esophagitis: Secondary | ICD-10-CM | POA: Diagnosis not present

## 2016-04-27 DIAGNOSIS — R0789 Other chest pain: Secondary | ICD-10-CM | POA: Diagnosis not present

## 2016-04-27 LAB — POCT GLYCOSYLATED HEMOGLOBIN (HGB A1C): HEMOGLOBIN A1C: 13.2

## 2016-04-27 MED ORDER — INSULIN ASPART 100 UNIT/ML FLEXPEN: 3.0000 [IU] | PEN_INJECTOR | Freq: Three times a day (TID) | SUBCUTANEOUS | 0 refills | Status: DC

## 2016-04-27 NOTE — Patient Instructions (Signed)
Lets start you on Novolog 3 units with meals. Continue your Lantus and Trulicity. Please follow up in 1 week or call me if you see your sugars are low.

## 2016-04-27 NOTE — Progress Notes (Signed)
   [name] Phone: [phone]   Date of Visit: 04/27/2016   HPI:  DM2, uncontrolled: -  A1c: 8.1 (12/04/15) < 9.9 (05/2015) < 11.2 (12/2014) < 8.6 (07/2014) - Medications: Trulicity 1.5mg  (from 0.75mg ) weekly, Metformin 1000mg  BID, Lantus 24 units daily (patient self titrated from 18 units daily) - Up to date on PNA 23V  - reports she has been on Lantus 24 units daily for about 2 weeks - for the past month or so cbgs have been in the 200s. For example, this morning fasting was 247 and yesterday was 212. She also checks 2 hrs post-prandial after breakfast and dinner. After dinner, range is 200-250, after breakfast this morning was 313. She forgot her glucose log at home today.  - she denies any changes in her diet from the last time she had a visit for diabetes which was in December. She denies any changes in her activity level; she thinks she may be slightly more active.  - she alternates injection sites with every injection.  - she uses Trulicity pen appropriately (discussed with and demonstrated to pharmacy student today in clinic)  - she denies nausea, vomiting, abdominal pain, polyuria, polydipsia.  - no low cbgs. Reports the lowest was 110 which was " a while ago".   HTN:  - Medications: Norvasc 10mg  daily, Coreg 25 BID, HCTZ 25 mg daily  - Lisinopril was discontinued at last visit due to development of cough which has now resolved per patient - denies HA, blurred vision, chest pain, shortness of breath, worsening lower extremity swelling, dizziness or lightheadedness.  - does not check BP at home  - compliant with medications  ROS: See HPI.  [name]:  PMH: Obesity DM2 with neuropathy HTN Pulmonary HTN HLD Migraine Chronic Venous Insufficiency with Varicose Veins OSA OA Nephrolithiasis, recurrent  Chronic Venous Insufficiency   PHYSICAL EXAM: BP 125/79 (BP Location: Left Arm, Patient Position: Sitting, Cuff Size: Normal)   Pulse 91   Temp 98.3  F (36.8 C) (Oral)   Ht 5\' 8"  (1.727 m)   Wt 260 lb (117.9 kg)   SpO2 96%   BMI 39.53 kg/m  GEN: NAD, non-toxic appearing  CV: RRR, no murmurs, rubs, or gallops PULM: CTAB, normal effort ABD: Soft, nontender, nondistended, NABS, no organomegaly SKIN: No rash or cyanosis; warm and well-perfused EXTR: trace LE edema bilaterally. No calf tenderness  PSYCH: Mood and affect euthymic, normal rate and volume of speech NEURO: Awake, alert, no focal deficits grossly, normal speech  ASSESSMENT/PLAN:  HTN (hypertension) BP at goal today. Will need to see if we can switch HCTZ to an ARB for renal protection with her diagnosis of DM2 since patient had cough with ACE inhibitor. Will address in 1 week at follow up visit. Continue current regimen. BMP today   DM (diabetes mellitus), type 2 (Kathryn Crosby) Uncontrolled. A1c 13.2 today. Unclear why cbgs are uncontrolled compared to her reported readings in December. Denies any changes in diet or exercise. Seems to be using medications appropriately after reviewing with pharmacy. Denies symptoms of n/v, polyuria, polydipsia. Will continue Lantus 24 units daily. Will start Novolog 3 units TID with meals. Discussed monitoring for hypoglycemia. Reviewed treatment for hypoglycemia. Follow up in 1 week or sooner if she has lows. Continue Trulicity and Metformin. Check CBG AM fasting, and 2hr PP after each meals.  BMP today.   Smiley Houseman, MD PGY [place]

## 2016-04-28 ENCOUNTER — Telehealth: Payer: Self-pay | Admitting: Internal Medicine

## 2016-04-28 DIAGNOSIS — R0789 Other chest pain: Secondary | ICD-10-CM | POA: Diagnosis not present

## 2016-04-28 DIAGNOSIS — E119 Type 2 diabetes mellitus without complications: Secondary | ICD-10-CM | POA: Diagnosis not present

## 2016-04-28 DIAGNOSIS — E876 Hypokalemia: Secondary | ICD-10-CM | POA: Diagnosis not present

## 2016-04-28 DIAGNOSIS — I10 Essential (primary) hypertension: Secondary | ICD-10-CM | POA: Diagnosis not present

## 2016-04-28 DIAGNOSIS — E782 Mixed hyperlipidemia: Secondary | ICD-10-CM | POA: Diagnosis not present

## 2016-04-28 DIAGNOSIS — I517 Cardiomegaly: Secondary | ICD-10-CM | POA: Diagnosis not present

## 2016-04-28 DIAGNOSIS — E1149 Type 2 diabetes mellitus with other diabetic neurological complication: Secondary | ICD-10-CM | POA: Diagnosis not present

## 2016-04-28 DIAGNOSIS — R079 Chest pain, unspecified: Secondary | ICD-10-CM | POA: Diagnosis not present

## 2016-04-28 LAB — BASIC METABOLIC PANEL
BUN/Creatinine Ratio: 12 (ref 9–23)
BUN: 10 mg/dL (ref 6–24)
CO2: 25 mmol/L (ref 18–29)
Calcium: 8.9 mg/dL (ref 8.7–10.2)
Chloride: 101 mmol/L (ref 96–106)
Creatinine, Ser: 0.85 mg/dL (ref 0.57–1.00)
GFR calc Af Amer: 87 mL/min/{1.73_m2} (ref >59)
GFR calc non Af Amer: 76 mL/min/{1.73_m2} (ref >59)
Glucose: 315 mg/dL — ABNORMAL HIGH (ref 65–99)
Potassium: 3.2 mmol/L — ABNORMAL LOW (ref 3.5–5.2)
Sodium: 142 mmol/L (ref 134–144)

## 2016-04-28 NOTE — Telephone Encounter (Signed)
Called patient but went to voicemail. Left message to call back.

## 2016-04-28 NOTE — Telephone Encounter (Signed)
Pt was returning your call, please call back. ep °

## 2016-04-28 NOTE — Assessment & Plan Note (Addendum)
Uncontrolled. A1c 13.2 today. Unclear why cbgs are uncontrolled compared to her reported readings in December. Denies any changes in diet or exercise. Seems to be using medications appropriately after reviewing with pharmacy. Denies symptoms of n/v, polyuria, polydipsia. Will continue Lantus 24 units daily. Will start Novolog 3 units TID with meals. Discussed monitoring for hypoglycemia. Reviewed treatment for hypoglycemia. Follow up in 1 week or sooner if she has lows. Continue Trulicity and Metformin. Check CBG AM fasting, and 2hr PP after each meals.  BMP today.

## 2016-04-28 NOTE — Assessment & Plan Note (Addendum)
BP at goal today. Will need to see if we can switch HCTZ to an ARB for renal protection with her diagnosis of DM2 since patient had cough with ACE inhibitor. Will address in 1 week at follow up visit. Continue current regimen. BMP today

## 2016-04-29 DIAGNOSIS — E119 Type 2 diabetes mellitus without complications: Secondary | ICD-10-CM | POA: Diagnosis not present

## 2016-04-30 DIAGNOSIS — E119 Type 2 diabetes mellitus without complications: Secondary | ICD-10-CM | POA: Diagnosis not present

## 2016-05-01 DIAGNOSIS — E119 Type 2 diabetes mellitus without complications: Secondary | ICD-10-CM | POA: Diagnosis not present

## 2016-05-02 DIAGNOSIS — N200 Calculus of kidney: Secondary | ICD-10-CM | POA: Diagnosis not present

## 2016-05-02 DIAGNOSIS — N39498 Other specified urinary incontinence: Secondary | ICD-10-CM | POA: Diagnosis not present

## 2016-05-02 DIAGNOSIS — N3946 Mixed incontinence: Secondary | ICD-10-CM | POA: Diagnosis not present

## 2016-05-02 DIAGNOSIS — I10 Essential (primary) hypertension: Secondary | ICD-10-CM | POA: Diagnosis not present

## 2016-05-02 DIAGNOSIS — E119 Type 2 diabetes mellitus without complications: Secondary | ICD-10-CM | POA: Diagnosis not present

## 2016-05-03 DIAGNOSIS — E119 Type 2 diabetes mellitus without complications: Secondary | ICD-10-CM | POA: Diagnosis not present

## 2016-05-04 ENCOUNTER — Encounter: Payer: Self-pay | Admitting: Student

## 2016-05-04 ENCOUNTER — Ambulatory Visit (INDEPENDENT_AMBULATORY_CARE_PROVIDER_SITE_OTHER): Payer: Medicare HMO | Admitting: Student

## 2016-05-04 DIAGNOSIS — E1142 Type 2 diabetes mellitus with diabetic polyneuropathy: Secondary | ICD-10-CM | POA: Diagnosis not present

## 2016-05-04 DIAGNOSIS — E119 Type 2 diabetes mellitus without complications: Secondary | ICD-10-CM | POA: Diagnosis not present

## 2016-05-04 NOTE — Progress Notes (Signed)
   Subjective:    Patient ID: Kathryn Crosby, female    DOB: April 15, 1960, 56 y.o.   MRN: [id number]   CC: Diabetes follow up  HPI: 56 y/o with poorly controlled T2DM present for DM follow up  DM - at last visit, one week ago, she was started on meal time insulin with the plan to follow up after she had been using it for one week - she picked up the insulin yesterday but has not started to use it yet - she has tried to improve her diet over the last week and she feels her CBGs have been better since - this AM fasting was 166 which is usually in the 200s - she did not bring her meter or a CBG log  Smoking status reviewed  Review of Systems  Per HPI, else denies recent illness, fever, chest pain, shortness of breath    Objective:  BP 104/72   Pulse 75   Temp 98.6 F (37 C) (Oral)   Wt 225 lb (102.1 kg)   SpO2 97%   BMI 34.21 kg/m  Vitals and nursing note reviewed  General: NAD Cardiac: RRR,  Respiratory: CTAB, normal effort Neuro: alert and oriented, no focal deficits   Assessment & Plan:    DM (diabetes mellitus), type 2 ([place]) Encouraged her to take mealtime insulin, take and record CBGs and return in one week. Hypoglycemia precautions discussed    Kathryn Crosby A. Lincoln Brigham MD, [facility] Resident PGY-3 Pager [phone]

## 2016-05-04 NOTE — Patient Instructions (Signed)
Follow up in 1 week Record blood glucose Take the meal time insulin. If you have low blood sugars, eat something sweet like juice or fruit Get at lest 30 mins of exercise per day Obtain twice as many veg's as protein or carbohydrate foods for both lunch and dinner. If you have questions or concerns, call the office at [phone]

## 2016-05-04 NOTE — Assessment & Plan Note (Addendum)
Encouraged her to take mealtime insulin, take and record CBGs and return in one week. Hypoglycemia precautions discussed. Diet and exercise counseling given

## 2016-05-05 DIAGNOSIS — E119 Type 2 diabetes mellitus without complications: Secondary | ICD-10-CM | POA: Diagnosis not present

## 2016-05-06 DIAGNOSIS — E119 Type 2 diabetes mellitus without complications: Secondary | ICD-10-CM | POA: Diagnosis not present

## 2016-05-06 DIAGNOSIS — G4733 Obstructive sleep apnea (adult) (pediatric): Secondary | ICD-10-CM | POA: Diagnosis not present

## 2016-05-07 DIAGNOSIS — E119 Type 2 diabetes mellitus without complications: Secondary | ICD-10-CM | POA: Diagnosis not present

## 2016-05-07 DIAGNOSIS — G4733 Obstructive sleep apnea (adult) (pediatric): Secondary | ICD-10-CM | POA: Diagnosis not present

## 2016-05-08 DIAGNOSIS — E119 Type 2 diabetes mellitus without complications: Secondary | ICD-10-CM | POA: Diagnosis not present

## 2016-05-09 DIAGNOSIS — E119 Type 2 diabetes mellitus without complications: Secondary | ICD-10-CM | POA: Diagnosis not present

## 2016-05-10 ENCOUNTER — Encounter: Payer: Self-pay | Admitting: Internal Medicine

## 2016-05-10 DIAGNOSIS — E119 Type 2 diabetes mellitus without complications: Secondary | ICD-10-CM | POA: Diagnosis not present

## 2016-05-10 DIAGNOSIS — E11319 Type 2 diabetes mellitus with unspecified diabetic retinopathy without macular edema: Secondary | ICD-10-CM | POA: Insufficient documentation

## 2016-05-11 DIAGNOSIS — E119 Type 2 diabetes mellitus without complications: Secondary | ICD-10-CM | POA: Diagnosis not present

## 2016-05-12 DIAGNOSIS — E119 Type 2 diabetes mellitus without complications: Secondary | ICD-10-CM | POA: Diagnosis not present

## 2016-05-13 DIAGNOSIS — E119 Type 2 diabetes mellitus without complications: Secondary | ICD-10-CM | POA: Diagnosis not present

## 2016-05-14 DIAGNOSIS — E119 Type 2 diabetes mellitus without complications: Secondary | ICD-10-CM | POA: Diagnosis not present

## 2016-05-15 DIAGNOSIS — E119 Type 2 diabetes mellitus without complications: Secondary | ICD-10-CM | POA: Diagnosis not present

## 2016-05-16 DIAGNOSIS — E119 Type 2 diabetes mellitus without complications: Secondary | ICD-10-CM | POA: Diagnosis not present

## 2016-05-17 DIAGNOSIS — E119 Type 2 diabetes mellitus without complications: Secondary | ICD-10-CM | POA: Diagnosis not present

## 2016-05-18 DIAGNOSIS — E119 Type 2 diabetes mellitus without complications: Secondary | ICD-10-CM | POA: Diagnosis not present

## 2016-05-19 DIAGNOSIS — E119 Type 2 diabetes mellitus without complications: Secondary | ICD-10-CM | POA: Diagnosis not present

## 2016-05-20 DIAGNOSIS — E119 Type 2 diabetes mellitus without complications: Secondary | ICD-10-CM | POA: Diagnosis not present

## 2016-05-21 DIAGNOSIS — E119 Type 2 diabetes mellitus without complications: Secondary | ICD-10-CM | POA: Diagnosis not present

## 2016-05-22 DIAGNOSIS — E119 Type 2 diabetes mellitus without complications: Secondary | ICD-10-CM | POA: Diagnosis not present

## 2016-05-23 DIAGNOSIS — E119 Type 2 diabetes mellitus without complications: Secondary | ICD-10-CM | POA: Diagnosis not present

## 2016-05-24 DIAGNOSIS — E119 Type 2 diabetes mellitus without complications: Secondary | ICD-10-CM | POA: Diagnosis not present

## 2016-05-25 DIAGNOSIS — E119 Type 2 diabetes mellitus without complications: Secondary | ICD-10-CM | POA: Diagnosis not present

## 2016-05-26 DIAGNOSIS — E119 Type 2 diabetes mellitus without complications: Secondary | ICD-10-CM | POA: Diagnosis not present

## 2016-05-27 DIAGNOSIS — R32 Unspecified urinary incontinence: Secondary | ICD-10-CM | POA: Diagnosis not present

## 2016-05-27 DIAGNOSIS — E119 Type 2 diabetes mellitus without complications: Secondary | ICD-10-CM | POA: Diagnosis not present

## 2016-05-28 DIAGNOSIS — E119 Type 2 diabetes mellitus without complications: Secondary | ICD-10-CM | POA: Diagnosis not present

## 2016-05-29 DIAGNOSIS — E119 Type 2 diabetes mellitus without complications: Secondary | ICD-10-CM | POA: Diagnosis not present

## 2016-05-30 DIAGNOSIS — E119 Type 2 diabetes mellitus without complications: Secondary | ICD-10-CM | POA: Diagnosis not present

## 2016-05-31 DIAGNOSIS — E119 Type 2 diabetes mellitus without complications: Secondary | ICD-10-CM | POA: Diagnosis not present

## 2016-06-01 DIAGNOSIS — E119 Type 2 diabetes mellitus without complications: Secondary | ICD-10-CM | POA: Diagnosis not present

## 2016-06-02 DIAGNOSIS — N39498 Other specified urinary incontinence: Secondary | ICD-10-CM | POA: Diagnosis not present

## 2016-06-02 DIAGNOSIS — N200 Calculus of kidney: Secondary | ICD-10-CM | POA: Diagnosis not present

## 2016-06-02 DIAGNOSIS — E119 Type 2 diabetes mellitus without complications: Secondary | ICD-10-CM | POA: Diagnosis not present

## 2016-06-02 DIAGNOSIS — N3946 Mixed incontinence: Secondary | ICD-10-CM | POA: Diagnosis not present

## 2016-06-02 DIAGNOSIS — I10 Essential (primary) hypertension: Secondary | ICD-10-CM | POA: Diagnosis not present

## 2016-06-03 DIAGNOSIS — E119 Type 2 diabetes mellitus without complications: Secondary | ICD-10-CM | POA: Diagnosis not present

## 2016-06-04 DIAGNOSIS — E119 Type 2 diabetes mellitus without complications: Secondary | ICD-10-CM | POA: Diagnosis not present

## 2016-06-05 DIAGNOSIS — E119 Type 2 diabetes mellitus without complications: Secondary | ICD-10-CM | POA: Diagnosis not present

## 2016-06-06 DIAGNOSIS — G4733 Obstructive sleep apnea (adult) (pediatric): Secondary | ICD-10-CM | POA: Diagnosis not present

## 2016-06-06 DIAGNOSIS — E119 Type 2 diabetes mellitus without complications: Secondary | ICD-10-CM | POA: Diagnosis not present

## 2016-06-07 DIAGNOSIS — E119 Type 2 diabetes mellitus without complications: Secondary | ICD-10-CM | POA: Diagnosis not present

## 2016-06-08 ENCOUNTER — Other Ambulatory Visit: Payer: Self-pay | Admitting: Internal Medicine

## 2016-06-08 DIAGNOSIS — I10 Essential (primary) hypertension: Secondary | ICD-10-CM

## 2016-06-08 DIAGNOSIS — E119 Type 2 diabetes mellitus without complications: Secondary | ICD-10-CM | POA: Diagnosis not present

## 2016-06-09 DIAGNOSIS — E119 Type 2 diabetes mellitus without complications: Secondary | ICD-10-CM | POA: Diagnosis not present

## 2016-06-10 DIAGNOSIS — E119 Type 2 diabetes mellitus without complications: Secondary | ICD-10-CM | POA: Diagnosis not present

## 2016-06-11 DIAGNOSIS — E119 Type 2 diabetes mellitus without complications: Secondary | ICD-10-CM | POA: Diagnosis not present

## 2016-06-12 DIAGNOSIS — E119 Type 2 diabetes mellitus without complications: Secondary | ICD-10-CM | POA: Diagnosis not present

## 2016-06-13 DIAGNOSIS — E119 Type 2 diabetes mellitus without complications: Secondary | ICD-10-CM | POA: Diagnosis not present

## 2016-06-14 DIAGNOSIS — E119 Type 2 diabetes mellitus without complications: Secondary | ICD-10-CM | POA: Diagnosis not present

## 2016-06-15 DIAGNOSIS — E119 Type 2 diabetes mellitus without complications: Secondary | ICD-10-CM | POA: Diagnosis not present

## 2016-06-16 DIAGNOSIS — E119 Type 2 diabetes mellitus without complications: Secondary | ICD-10-CM | POA: Diagnosis not present

## 2016-06-17 DIAGNOSIS — E119 Type 2 diabetes mellitus without complications: Secondary | ICD-10-CM | POA: Diagnosis not present

## 2016-06-18 DIAGNOSIS — E119 Type 2 diabetes mellitus without complications: Secondary | ICD-10-CM | POA: Diagnosis not present

## 2016-06-19 DIAGNOSIS — E119 Type 2 diabetes mellitus without complications: Secondary | ICD-10-CM | POA: Diagnosis not present

## 2016-06-20 ENCOUNTER — Ambulatory Visit (INDEPENDENT_AMBULATORY_CARE_PROVIDER_SITE_OTHER): Payer: Medicare HMO | Admitting: Internal Medicine

## 2016-06-20 ENCOUNTER — Encounter: Payer: Self-pay | Admitting: Internal Medicine

## 2016-06-20 VITALS — BP 110/80 | HR 85 | Temp 98.3°F | Ht 68.0 in | Wt 255.0 lb

## 2016-06-20 DIAGNOSIS — E119 Type 2 diabetes mellitus without complications: Secondary | ICD-10-CM | POA: Diagnosis not present

## 2016-06-20 DIAGNOSIS — E1142 Type 2 diabetes mellitus with diabetic polyneuropathy: Secondary | ICD-10-CM | POA: Diagnosis not present

## 2016-06-20 DIAGNOSIS — Z794 Long term (current) use of insulin: Secondary | ICD-10-CM | POA: Diagnosis not present

## 2016-06-20 DIAGNOSIS — E785 Hyperlipidemia, unspecified: Secondary | ICD-10-CM | POA: Diagnosis not present

## 2016-06-20 MED ORDER — METFORMIN HCL 1000 MG PO TABS: 1000.0000 mg | ORAL_TABLET | Freq: Two times a day (BID) | ORAL | 3 refills | Status: DC

## 2016-06-20 MED ORDER — AMLODIPINE BESYLATE 10 MG PO TABS: 10.0000 mg | ORAL_TABLET | Freq: Every day | ORAL | 3 refills | Status: DC

## 2016-06-20 NOTE — Progress Notes (Signed)
   [facility] Phone: [phone]   Date of Visit: 06/20/2016   HPI:  DM2:  -  A1c: 13.2 (03/2016)< 8.1 (12/04/15) <9.9 (05/2015) < 11.2 (12/2014) < 8.6 (07/2014) - Medications: Trulicity 1.5mg  (from 0.75mg ) weekly, Metformin 1000mg  BID, Lantus, Novolog - patient reports of taking Lantus 18-24 units depending on her CBG. She has been taking Novolog 3 units TID with meals.  - has only been checking cbg daily. - CBGs AM fasting: 147, 181, 219, 209, 182, 312, 204, 211, 189 - denies polyuria or polydipsia, no abdominal pain or nausea/vomiting   HDL:  - on Lipitor 40mg  daily. Compliant with medications - denies myalgias or RUQ pain - due for lipid panel today and is fasting - has not been following healthy diet recently but is going back to healthy lifestyle  ROS: See HPI.  [place]:  PMH: Obesity DM2 with neuropathy HTN Pulmonary HTN HLD Migraine Chronic Venous Insufficiency with Varicose Veins OSA OA Nephrolithiasis, recurrent  Chronic Venous Insufficiency   PHYSICAL EXAM: BP 110/80   Pulse 85   Temp 98.3 F (36.8 C) (Oral)   Ht 5\' 8"  (1.727 m)   Wt 255 lb (115.7 kg)   SpO2 98%   BMI 38.77 kg/m  GEN: NAD CV: RRR, no murmurs, rubs, or gallops PULM: CTAB, normal effort SKIN: No rash or cyanosis; warm and well-perfused EXTR: No lower extremity edema or calf tenderness PSYCH: Mood and affect euthymic, normal rate and volume of speech NEURO: Awake, alert, no focal deficits grossly, normal speech   ASSESSMENT/PLAN:  Health maintenance:  - lipid panel today   DM (diabetes mellitus), type 2 ([facility]) Uncontrolled. Plan to use a consistent dose of Lantus (and only change with uptitration after we discuss). Lantus 20 units a day and Novolog 3 units TID with meals. Check CBG AM fasting and 2hr postprandial. Patient to get MyChart and message me in 1 week to review cbgs. Next A1c is due next month. Counseled on healthy diet and  lifestyle  Hyperlipidemia Lipid panel today. Continue Lipitor 40mg . Counseled on healthy diet and lifestyle.    Smiley Houseman, MD PGY [place]

## 2016-06-20 NOTE — Patient Instructions (Addendum)
Please check your sugar: check in the morning before eating, then check two hours after each meal.   Lantus 20 units units a day. Continue Novolog 3 units with meals. Message me in 1 week to review sugars.   Follow up in clinic in about 1 month   Make an appointment for your mammogram     Diet Recommendations for Diabetes   Starchy (carb) foods include: Bread, rice, pasta, potatoes, corn, crackers, bagels, muffins, all baked goods.  (Fruits, milk, and yogurt also have carbohydrate, but most of these foods will not spike your blood sugar as the starchy foods will.)  A few fruits do cause high blood sugars; use small portions of bananas (limit to 1/2 at a time), grapes, and most tropical fruits.    Protein foods include: Meat, fish, poultry, eggs, dairy foods, and beans such as pinto and kidney beans (beans also provide carbohydrate).   1. Eat at least 3 meals and 1-2 snacks per day. Never go more than 4-5 hours while awake without eating.  2. Limit starchy foods to TWO per meal and ONE per snack. ONE portion of a starchy  food is equal to the following:   - ONE slice of bread (or its equivalent, such as half of a hamburger bun).   - 1/2 cup of a "scoopable" starchy food such as potatoes or rice.   - 15 grams of carbohydrate as shown on food label.  3. Both lunch and dinner should include a protein food, a carb food, and vegetables.   - Obtain twice as many veg's as protein or carbohydrate foods for both lunch and dinner.   - Fresh or frozen veg's are best.   - Try to keep frozen veg's on hand for a quick vegetable serving.    4. Breakfast should always include protein.

## 2016-06-21 ENCOUNTER — Telehealth: Payer: Self-pay | Admitting: Internal Medicine

## 2016-06-21 DIAGNOSIS — E119 Type 2 diabetes mellitus without complications: Secondary | ICD-10-CM | POA: Diagnosis not present

## 2016-06-21 LAB — LIPID PANEL
CHOL/HDL RATIO: 5.2 ratio — AB (ref 0.0–4.4)
Cholesterol, Total: 162 mg/dL (ref 100–199)
HDL: 31 mg/dL — ABNORMAL LOW (ref >39)
LDL Calculated: 98 mg/dL (ref 0–99)
TRIGLYCERIDES: 166 mg/dL — AB (ref 0–149)
VLDL Cholesterol Cal: 33 mg/dL (ref 5–40)

## 2016-06-21 NOTE — Assessment & Plan Note (Signed)
Lipid panel today. Continue Lipitor 40mg . Counseled on healthy diet and lifestyle.

## 2016-06-21 NOTE — Assessment & Plan Note (Addendum)
Uncontrolled. Plan to use a consistent dose of Lantus (and only change with uptitration after we discuss). Lantus 20 units a day and Novolog 3 units TID with meals. Check CBG AM fasting and 2hr postprandial. Patient to get MyChart and message me in 1 week to review cbgs. Next A1c is due next month. Counseled on healthy diet and lifestyle

## 2016-06-21 NOTE — Telephone Encounter (Signed)
Called patient but went to voicemail. Left message to call back.

## 2016-06-21 NOTE — Addendum Note (Signed)
Addended by: Smiley Houseman on: 06/21/2016 05:22 PM   Modules accepted: Level of Service

## 2016-06-22 ENCOUNTER — Telehealth: Payer: Self-pay | Admitting: *Deleted

## 2016-06-22 DIAGNOSIS — E119 Type 2 diabetes mellitus without complications: Secondary | ICD-10-CM | POA: Diagnosis not present

## 2016-06-22 NOTE — Telephone Encounter (Signed)
Will forward to MD. Hernandez Losasso,CMA  

## 2016-06-22 NOTE — Telephone Encounter (Signed)
Patient returning MD's call from yesterday.

## 2016-06-23 ENCOUNTER — Other Ambulatory Visit: Payer: Self-pay | Admitting: Internal Medicine

## 2016-06-23 DIAGNOSIS — Z1231 Encounter for screening mammogram for malignant neoplasm of breast: Secondary | ICD-10-CM

## 2016-06-23 DIAGNOSIS — E119 Type 2 diabetes mellitus without complications: Secondary | ICD-10-CM | POA: Diagnosis not present

## 2016-06-23 NOTE — Telephone Encounter (Signed)
Attempted to call again, but went to voicemail.

## 2016-06-23 NOTE — Telephone Encounter (Signed)
Pt is returning drs call.  Number is [phone]. Message can be left on voicemail

## 2016-06-24 DIAGNOSIS — E119 Type 2 diabetes mellitus without complications: Secondary | ICD-10-CM | POA: Diagnosis not present

## 2016-06-24 NOTE — Telephone Encounter (Signed)
Spoke with patient about lipid results. No changes to meds but encouraged diet and lifestyle changes. Reminded patient to message MyChart next week to review CBGs. Questions answered.

## 2016-06-25 DIAGNOSIS — E119 Type 2 diabetes mellitus without complications: Secondary | ICD-10-CM | POA: Diagnosis not present

## 2016-06-26 DIAGNOSIS — E119 Type 2 diabetes mellitus without complications: Secondary | ICD-10-CM | POA: Diagnosis not present

## 2016-06-27 DIAGNOSIS — E119 Type 2 diabetes mellitus without complications: Secondary | ICD-10-CM | POA: Diagnosis not present

## 2016-06-28 DIAGNOSIS — E119 Type 2 diabetes mellitus without complications: Secondary | ICD-10-CM | POA: Diagnosis not present

## 2016-06-29 ENCOUNTER — Other Ambulatory Visit: Payer: Self-pay | Admitting: Internal Medicine

## 2016-06-29 DIAGNOSIS — E119 Type 2 diabetes mellitus without complications: Secondary | ICD-10-CM | POA: Diagnosis not present

## 2016-06-30 DIAGNOSIS — E119 Type 2 diabetes mellitus without complications: Secondary | ICD-10-CM | POA: Diagnosis not present

## 2016-07-01 DIAGNOSIS — E119 Type 2 diabetes mellitus without complications: Secondary | ICD-10-CM | POA: Diagnosis not present

## 2016-07-02 DIAGNOSIS — E119 Type 2 diabetes mellitus without complications: Secondary | ICD-10-CM | POA: Diagnosis not present

## 2016-07-03 DIAGNOSIS — E119 Type 2 diabetes mellitus without complications: Secondary | ICD-10-CM | POA: Diagnosis not present

## 2016-07-04 ENCOUNTER — Telehealth: Payer: Self-pay | Admitting: Internal Medicine

## 2016-07-04 DIAGNOSIS — E119 Type 2 diabetes mellitus without complications: Secondary | ICD-10-CM | POA: Diagnosis not present

## 2016-07-04 NOTE — Telephone Encounter (Signed)
Called patient to review CBG logs. Current insulin dose Lantus 20 units and Novolog 3 units TID.   5/20: AM 141, 147 2 hr PP lunch 5/21: AM 106, 101 2 hr PP lunch 5/23: AM 100, 108 2 hr PP lunch 5/24: AM 103, 92 2 hr PP lunch 5/25: AM 109, 109 2 hr PP lunch 5/26: 101 AM, 249 2 hr PP lunch (visited the hospital and ate at cafeteria) 5/27: 100 AM, 100 2 hr PP lunch 5/28: 100 AM, 106 2 hr PP lunch 5/29: 187 AM, 105 2 hr PP lunch 5/30:  108 AM, 107 2hr PP lunch 5/31: 104 AM   No lows or hypoglycemia symptoms. CBGs seem controlled and therefore will not change dose. However, discussed that I would like to see her 2 hr PP for each meal. Has an appointment on 6/22. Will call her again around 6/15 to review.

## 2016-07-05 DIAGNOSIS — E119 Type 2 diabetes mellitus without complications: Secondary | ICD-10-CM | POA: Diagnosis not present

## 2016-07-06 ENCOUNTER — Other Ambulatory Visit: Payer: Self-pay | Admitting: Internal Medicine

## 2016-07-06 DIAGNOSIS — G4733 Obstructive sleep apnea (adult) (pediatric): Secondary | ICD-10-CM | POA: Diagnosis not present

## 2016-07-06 DIAGNOSIS — E119 Type 2 diabetes mellitus without complications: Secondary | ICD-10-CM | POA: Diagnosis not present

## 2016-07-07 DIAGNOSIS — G4733 Obstructive sleep apnea (adult) (pediatric): Secondary | ICD-10-CM | POA: Diagnosis not present

## 2016-07-07 DIAGNOSIS — E119 Type 2 diabetes mellitus without complications: Secondary | ICD-10-CM | POA: Diagnosis not present

## 2016-07-08 DIAGNOSIS — E119 Type 2 diabetes mellitus without complications: Secondary | ICD-10-CM | POA: Diagnosis not present

## 2016-07-09 DIAGNOSIS — E119 Type 2 diabetes mellitus without complications: Secondary | ICD-10-CM | POA: Diagnosis not present

## 2016-07-10 DIAGNOSIS — E119 Type 2 diabetes mellitus without complications: Secondary | ICD-10-CM | POA: Diagnosis not present

## 2016-07-11 DIAGNOSIS — E119 Type 2 diabetes mellitus without complications: Secondary | ICD-10-CM | POA: Diagnosis not present

## 2016-07-12 DIAGNOSIS — E119 Type 2 diabetes mellitus without complications: Secondary | ICD-10-CM | POA: Diagnosis not present

## 2016-07-13 DIAGNOSIS — E119 Type 2 diabetes mellitus without complications: Secondary | ICD-10-CM | POA: Diagnosis not present

## 2016-07-14 DIAGNOSIS — E119 Type 2 diabetes mellitus without complications: Secondary | ICD-10-CM | POA: Diagnosis not present

## 2016-07-15 DIAGNOSIS — E119 Type 2 diabetes mellitus without complications: Secondary | ICD-10-CM | POA: Diagnosis not present

## 2016-07-16 DIAGNOSIS — E119 Type 2 diabetes mellitus without complications: Secondary | ICD-10-CM | POA: Diagnosis not present

## 2016-07-17 DIAGNOSIS — E119 Type 2 diabetes mellitus without complications: Secondary | ICD-10-CM | POA: Diagnosis not present

## 2016-07-18 DIAGNOSIS — E119 Type 2 diabetes mellitus without complications: Secondary | ICD-10-CM | POA: Diagnosis not present

## 2016-07-19 ENCOUNTER — Ambulatory Visit: Payer: Medicare HMO

## 2016-07-19 ENCOUNTER — Telehealth: Payer: Self-pay | Admitting: Internal Medicine

## 2016-07-19 DIAGNOSIS — E119 Type 2 diabetes mellitus without complications: Secondary | ICD-10-CM | POA: Diagnosis not present

## 2016-07-19 NOTE — Telephone Encounter (Signed)
Called to discuss cbgs Highest 155 Lows: no lows Patient is not at home currently but reports her cbgs have been doing well.  We will discuss more in detail on Friday at her visit.

## 2016-07-20 DIAGNOSIS — E119 Type 2 diabetes mellitus without complications: Secondary | ICD-10-CM | POA: Diagnosis not present

## 2016-07-21 DIAGNOSIS — E119 Type 2 diabetes mellitus without complications: Secondary | ICD-10-CM | POA: Diagnosis not present

## 2016-07-22 ENCOUNTER — Ambulatory Visit (INDEPENDENT_AMBULATORY_CARE_PROVIDER_SITE_OTHER): Payer: Medicare HMO | Admitting: Internal Medicine

## 2016-07-22 ENCOUNTER — Other Ambulatory Visit: Payer: Self-pay | Admitting: Internal Medicine

## 2016-07-22 ENCOUNTER — Encounter: Payer: Self-pay | Admitting: Internal Medicine

## 2016-07-22 ENCOUNTER — Ambulatory Visit
Admission: RE | Admit: 2016-07-22 | Discharge: 2016-07-22 | Disposition: A | Discharge status: Home / Self Care | Payer: Medicare HMO | Source: Ambulatory Visit | Attending: Family Medicine | Admitting: Family Medicine

## 2016-07-22 VITALS — BP 110/70 | HR 84 | Temp 98.3°F | Ht 68.0 in | Wt 262.0 lb

## 2016-07-22 DIAGNOSIS — M542 Cervicalgia: Secondary | ICD-10-CM | POA: Diagnosis not present

## 2016-07-22 DIAGNOSIS — Z1231 Encounter for screening mammogram for malignant neoplasm of breast: Secondary | ICD-10-CM

## 2016-07-22 DIAGNOSIS — E1142 Type 2 diabetes mellitus with diabetic polyneuropathy: Secondary | ICD-10-CM

## 2016-07-22 DIAGNOSIS — R928 Other abnormal and inconclusive findings on diagnostic imaging of breast: Secondary | ICD-10-CM

## 2016-07-22 DIAGNOSIS — E119 Type 2 diabetes mellitus without complications: Secondary | ICD-10-CM | POA: Diagnosis not present

## 2016-07-22 LAB — POCT GLYCOSYLATED HEMOGLOBIN (HGB A1C): HEMOGLOBIN A1C: 10

## 2016-07-22 MED ORDER — NAPROXEN 500 MG PO TABS: 500.0000 mg | ORAL_TABLET | Freq: Two times a day (BID) | ORAL | 0 refills | Status: DC

## 2016-07-22 NOTE — Progress Notes (Signed)
[facility] Phone: [phone]   Date of Visit: 07/22/2016   HPI:  Right Sided Neck Pain:  - reports of right neck pain for about 1 month - denies any injury, inciting event, or overuse - describes as a sharp pain without any radiation - denies any radiation of pain to the extremity - denies any numbness or tingling of the upper extremity   - pain has been stable and not worsening  - has tried heat which does not help  - has tried a small amount of advil (4 tablets total over the past month) without much relief - aggravating factors: pain with rotation of the neck, she cannot sleep on that side  - no alleviating factors - no prior history of neck pain  - denies shoulder pain - no night sweats, weight loss, or fevers  DM2:  - Current Medications: Trulicity 1.5 weekly, Lantus 18units, Novolog 3 units TID  - brought her cbg log all cbgs are mainly 150 and below except for a few postprandial dinner, but overall much improved  - denies polyuria or polydipsia, denies blurred vision  - is walking about 20 minutes a day with granddaughter  - is intermittently going to the [facility] and doing water aerobics but she does not like classes. We discussed finding activities she does enjoy.  - per chart review patient initially was on Losartan and Lisinopril, and therefore Losartan was discontinued. Lisinopril was discontinued due to cough (which improved after discontinuing). We discussed starting Losartan as this will provide renal protection. Of note, patient is taking potassium tablets due to hypokalemia. We discussed possibly decreasing this when we do start Losartan.    ROS: See HPI.  [place]:  PMH: Obesity DM2 with neuropathy and retinopathy  HTN Pulmonary HTN, Mild HLD Migraine Chronic Venous Insufficiency with Varicose Veins OSA OA Nephrolithiasis, recurrent  Chronic Venous Insufficiency  Urge Incontinence Family History of Heart Disease  PHYSICAL  EXAM: BP 110/70   Pulse 84   Temp 98.3 F (36.8 C) (Oral)   Ht 5\' 8"  (1.727 m)   Wt 262 lb (118.8 kg)   SpO2 97%   BMI 39.84 kg/m  Gen: NAD, pleasant HEENT: normal thyroid MSK:  Neck:  No tenderness to palpation of the c-spine. No swelling noted. Tenderness to palpation of the right sternocleidomastoid muscle (lower third of the muscle). ROM: flexion is normal but produces pain. Extension is slightly limited due to pain. Left lateral rotation is limited (45 degrees) and produces pain. Right lateral rotation is about 70 degrees.  Heart: RRR, 1-4/4 systolic murmur, no rubs or gallops Lungs: CTAB, normal effort  Ext: slight pitting edema bilaterally, no calf tenderness.   ASSESSMENT/PLAN:  Right Sided Neck Pain:  Likely muscular as this is reproducible with palpation of the lower third of the sternocleidomastoid. No bony tenderness. No red flags noted in history. Will try ROM exercises and 4-5 day course of Naproxen. Take with meal. Discussed risk and benefits of Naproxen and monitoring for side effects. Follow up in 1 week. If no improvement consider x-ray.    DM (diabetes mellitus), type 2 (HCC) A1c improved to 10 from 13.2 but still above goal. Her cbgs look pretty good though. I will not make any changes to regimen right now. Patient to increase physical activity (she is motivated) and watch her diet). We discussed starting patient on an ARB (had cough with ACE inhibitor) for renal protection. Since her blood pressure is well controlled, we would replace  HCTZ with Losartan. She is given K supplements by her urologist and he recently increased the dose from BID to TID. Will check a BMP today to monitor Cr and K. Then if okay, will discontinue HCTZ and start Losartan. Will likely decrease the frequency of K supplements when we make the change.  Follow up in 1 week   Smiley Houseman, MD PGY [place]

## 2016-07-22 NOTE — Patient Instructions (Addendum)
Continue your current diabetes medications including Lantus 18 units in the mornign and Novolog 3 units with meals. Please continue checking your sugars. Please follow up in 1 week.    Cervical Strain and Sprain Rehab Ask your health care provider which exercises are safe for you. Do exercises exactly as told by your health care provider and adjust them as directed. It is normal to feel mild stretching, pulling, tightness, or discomfort as you do these exercises, but you should stop right away if you feel sudden pain or your pain gets worse.Do not begin these exercises until told by your health care provider. Stretching and range of motion exercises These exercises warm up your muscles and joints and improve the movement and flexibility of your neck. These exercises also help to relieve pain, numbness, and tingling. Exercise A: Cervical side bend  1. Using good posture, sit on a stable chair or stand up. 2. Without moving your shoulders, slowly tilt your left / right ear to your shoulder until you feel a stretch in your neck muscles. You should be looking straight ahead. 3. Hold for __________ seconds. 4. Repeat with the other side of your neck. Repeat __________ times. Complete this exercise __________ times a day. Exercise B: Cervical rotation  1. Using good posture, sit on a stable chair or stand up. 2. Slowly turn your head to the side as if you are looking over your left / right shoulder. ? Keep your eyes level with the ground. ? Stop when you feel a stretch along the side and the back of your neck. 3. Hold for __________ seconds. 4. Repeat this by turning to your other side. Repeat __________ times. Complete this exercise __________ times a day. Exercise C: Thoracic extension and pectoral stretch 1. Roll a towel or a small blanket so it is about 4 inches (10 cm) in diameter. 2. Lie down on your back on a firm surface. 3. Put the towel lengthwise, under your spine in the middle of  your back. It should not be not under your shoulder blades. The towel should line up with your spine from your middle back to your lower back. 4. Put your hands behind your head and let your elbows fall out to your sides. 5. Hold for __________ seconds. Repeat __________ times. Complete this exercise __________ times a day. Strengthening exercises These exercises build strength and endurance in your neck. Endurance is the ability to use your muscles for a long time, even after your muscles get tired. Exercise D: Upper cervical flexion, isometric 1. Lie on your back with a thin pillow behind your head and a small rolled-up towel under your neck. 2. Gently tuck your chin toward your chest and nod your head down to look toward your feet. Do not lift your head off the pillow. 3. Hold for __________ seconds. 4. Release the tension slowly. Relax your neck muscles completely before you repeat this exercise. Repeat __________ times. Complete this exercise __________ times a day. Exercise E: Cervical extension, isometric  1. Stand about 6 inches (15 cm) away from a wall, with your back facing the wall. 2. Place a soft object, about 6-8 inches (15-20 cm) in diameter, between the back of your head and the wall. A soft object could be a small pillow, a ball, or a folded towel. 3. Gently tilt your head back and press into the soft object. Keep your jaw and forehead relaxed. 4. Hold for __________ seconds. 5. Release the tension slowly. Relax your  neck muscles completely before you repeat this exercise. Repeat __________ times. Complete this exercise __________ times a day. Posture and body mechanics  Body mechanics refers to the movements and positions of your body while you do your daily activities. Posture is part of body mechanics. Good posture and healthy body mechanics can help to relieve stress in your body's tissues and joints. Good posture means that your spine is in its natural S-curve position  (your spine is neutral), your shoulders are pulled back slightly, and your head is not tipped forward. The following are general guidelines for applying improved posture and body mechanics to your everyday activities. Standing  When standing, keep your spine neutral and keep your feet about hip-width apart. Keep a slight bend in your knees. Your ears, shoulders, and hips should line up.  When you do a task in which you stand in one place for a long time, place one foot up on a stable object that is 2-4 inches (5-10 cm) high, such as a footstool. This helps keep your spine neutral. Sitting   When sitting, keep your spine neutral and your keep feet flat on the floor. Use a footrest, if necessary, and keep your thighs parallel to the floor. Avoid rounding your shoulders, and avoid tilting your head forward.  When working at a desk or a computer, keep your desk at a height where your hands are slightly lower than your elbows. Slide your chair under your desk so you are close enough to maintain good posture.  When working at a computer, place your monitor at a height where you are looking straight ahead and you do not have to tilt your head forward or downward to look at the screen. Resting When lying down and resting, avoid positions that are most painful for you. Try to support your neck in a neutral position. You can use a contour pillow or a small rolled-up towel. Your pillow should support your neck but not push on it. This information is not intended to replace advice given to you by your health care provider. Make sure you discuss any questions you have with your health care provider. Document Released: 01/17/2005 Document Revised: 09/24/2015 Document Reviewed: 12/24/2014 Elsevier Interactive Patient Education  2018 [employer].  Neck Exercises Neck exercises can be important for many reasons:  They can help you to improve and maintain flexibility in your neck. This can be especially  important as you age.  They can help to make your neck stronger. This can make movement easier.  They can reduce or prevent neck pain.  They may help your upper back.  Ask your health care provider which neck exercises would be best for you. Exercises Neck Press Repeat this exercise 10 times. Do it first thing in the morning and right before bed or as told by your health care provider. 1. Lie on your back on a firm bed or on the floor with a pillow under your head. 2. Use your neck muscles to push your head down on the pillow and straighten your spine. 3. Hold the position as well as you can. Keep your head facing up and your chin tucked. 4. Slowly count to 5 while holding this position. 5. Relax for a few seconds. Then repeat.  Isometric Strengthening Do a full set of these exercises 2 times a day or as told by your health care provider. 1. Sit in a supportive chair and place your hand on your forehead. 2. Push forward with your  head and neck while pushing back with your hand. Hold for 10 seconds. 3. Relax. Then repeat the exercise 3 times. 4. Next, do thesequence again, this time putting your hand against the back of your head. Use your head and neck to push backward against the hand pressure. 5. Finally, do the same exercise on either side of your head, pushing sideways against the pressure of your hand.  Prone Head Lifts Repeat this exercise 5 times. Do this 2 times a day or as told by your health care provider. 1. Lie face-down, resting on your elbows so that your chest and upper back are raised. 2. Start with your head facing downward, near your chest. Position your chin either on or near your chest. 3. Slowly lift your head upward. Lift until you are looking straight ahead. Then continue lifting your head as far back as you can stretch. 4. Hold your head up for 5 seconds. Then slowly lower it to your starting position.  Supine Head Lifts Repeat this exercise 8-10 times. Do  this 2 times a day or as told by your health care provider. 1. Lie on your back, bending your knees to point to the ceiling and keeping your feet flat on the floor. 2. Lift your head slowly off the floor, raising your chin toward your chest. 3. Hold for 5 seconds. 4. Relax and repeat.  Scapular Retraction Repeat this exercise 5 times. Do this 2 times a day or as told by your health care provider. 1. Stand with your arms at your sides. Look straight ahead. 2. Slowly pull both shoulders backward and downward until you feel a stretch between your shoulder blades in your upper back. 3. Hold for 10-30 seconds. 4. Relax and repeat.  Contact a health care provider if:  Your neck pain or discomfort gets much worse when you do an exercise.  Your neck pain or discomfort does not improve within 2 hours after you exercise. If you have any of these problems, stop exercising right away. Do not do the exercises again unless your health care provider says that you can. Get help right away if:  You develop sudden, severe neck pain. If this happens, stop exercising right away. Do not do the exercises again unless your health care provider says that you can. Exercises Neck Stretch  Repeat this exercise 3-5 times. 1. Do this exercise while standing or while sitting in a chair. 2. Place your feet flat on the floor, shoulder-width apart. 3. Slowly turn your head to the right. Turn it all the way to the right so you can look over your right shoulder. Do not tilt or tip your head. 4. Hold this position for 10-30 seconds. 5. Slowly turn your head to the left, to look over your left shoulder. 6. Hold this position for 10-30 seconds.  Neck Retraction Repeat this exercise 8-10 times. Do this 3-4 times a day or as told by your health care provider. 1. Do this exercise while standing or while sitting in a sturdy chair. 2. Look straight ahead. Do not bend your neck. 3. Use your fingers to push your chin  backward. Do not bend your neck for this movement. Continue to face straight ahead. If you are doing the exercise properly, you will feel a slight sensation in your throat and a stretch at the back of your neck. 4. Hold the stretch for 1-2 seconds. Relax and repeat.  This information is not intended to replace advice given to you by  your health care provider. Make sure you discuss any questions you have with your health care provider. Document Released: 12/29/2014 Document Revised: 06/25/2015 Document Reviewed: 07/28/2014 Elsevier Interactive Patient Education  2017 [employer].

## 2016-07-22 NOTE — Assessment & Plan Note (Addendum)
A1c improved to 10 from 13.2 but still above goal. Her cbgs look pretty good though. I will not make any changes to regimen right now. Patient to increase physical activity (she is motivated) and watch her diet). We discussed starting patient on an ARB (had cough with ACE inhibitor) for renal protection. Since her blood pressure is well controlled, we would replace HCTZ with Losartan. She is given K supplements by her urologist and he recently increased the dose from BID to TID. Will check a BMP today to monitor Cr and K. Then if okay, will discontinue HCTZ and start Losartan. Will likely decrease the frequency of K supplements when we make the change.  Follow up in 1 week

## 2016-07-23 DIAGNOSIS — E119 Type 2 diabetes mellitus without complications: Secondary | ICD-10-CM | POA: Diagnosis not present

## 2016-07-23 LAB — BASIC METABOLIC PANEL
BUN/Creatinine Ratio: 16 (ref 9–23)
BUN: 13 mg/dL (ref 6–24)
CALCIUM: 8.7 mg/dL (ref 8.7–10.2)
CO2: 25 mmol/L (ref 20–29)
CREATININE: 0.83 mg/dL (ref 0.57–1.00)
Chloride: 108 mmol/L — ABNORMAL HIGH (ref 96–106)
GFR calc Af Amer: 90 mL/min/{1.73_m2} (ref >59)
GFR, EST NON AFRICAN AMERICAN: 78 mL/min/{1.73_m2} (ref >59)
Glucose: 175 mg/dL — ABNORMAL HIGH (ref 65–99)
Potassium: 3.9 mmol/L (ref 3.5–5.2)
Sodium: 146 mmol/L — ABNORMAL HIGH (ref 134–144)

## 2016-07-24 DIAGNOSIS — E119 Type 2 diabetes mellitus without complications: Secondary | ICD-10-CM | POA: Diagnosis not present

## 2016-07-25 ENCOUNTER — Other Ambulatory Visit: Payer: Self-pay | Admitting: Family Medicine

## 2016-07-25 ENCOUNTER — Telehealth: Payer: Self-pay | Admitting: Internal Medicine

## 2016-07-25 DIAGNOSIS — E119 Type 2 diabetes mellitus without complications: Secondary | ICD-10-CM | POA: Diagnosis not present

## 2016-07-25 DIAGNOSIS — R928 Other abnormal and inconclusive findings on diagnostic imaging of breast: Secondary | ICD-10-CM

## 2016-07-25 MED ORDER — LOSARTAN POTASSIUM 50 MG PO TABS: 50.0000 mg | ORAL_TABLET | Freq: Every day | ORAL | 0 refills | Status: DC

## 2016-07-25 NOTE — Telephone Encounter (Signed)
Please call patient to report that her creatinine (kidney function) is good and her potassium is stable. We can proceed with switching the HCTZ with Losartan for kidney protection (and blood pressure) as we discussed on Friday. Ask her to please stop the HCTZ, and we will replace with Losartan 50mg  daily (which I sent to the pharmacy). As we discussed this medication may affect her potasium level, so I would also recommend just taking your potassium tablet 1 tablet twice a day (instead of 1 tablet three times a day). Follow up in 1 week so we can monitor your kidneys and potassium again after making these changes. Keep an eye on your blood pressure as well; let us know if your blood pressure is too low. Thanks.

## 2016-07-26 DIAGNOSIS — E119 Type 2 diabetes mellitus without complications: Secondary | ICD-10-CM | POA: Diagnosis not present

## 2016-07-27 ENCOUNTER — Other Ambulatory Visit: Payer: Medicare HMO

## 2016-07-27 DIAGNOSIS — E119 Type 2 diabetes mellitus without complications: Secondary | ICD-10-CM | POA: Diagnosis not present

## 2016-07-28 DIAGNOSIS — E119 Type 2 diabetes mellitus without complications: Secondary | ICD-10-CM | POA: Diagnosis not present

## 2016-07-29 DIAGNOSIS — E119 Type 2 diabetes mellitus without complications: Secondary | ICD-10-CM | POA: Diagnosis not present

## 2016-07-30 DIAGNOSIS — E119 Type 2 diabetes mellitus without complications: Secondary | ICD-10-CM | POA: Diagnosis not present

## 2016-07-31 DIAGNOSIS — E119 Type 2 diabetes mellitus without complications: Secondary | ICD-10-CM | POA: Diagnosis not present

## 2016-08-01 ENCOUNTER — Telehealth: Payer: Self-pay | Admitting: Internal Medicine

## 2016-08-01 ENCOUNTER — Ambulatory Visit
Admission: RE | Admit: 2016-08-01 | Discharge: 2016-08-01 | Disposition: A | Discharge status: Home / Self Care | Payer: Medicare HMO | Source: Ambulatory Visit | Attending: Family Medicine | Admitting: Family Medicine

## 2016-08-01 ENCOUNTER — Other Ambulatory Visit: Payer: Self-pay | Admitting: Family Medicine

## 2016-08-01 DIAGNOSIS — E119 Type 2 diabetes mellitus without complications: Secondary | ICD-10-CM | POA: Diagnosis not present

## 2016-08-01 DIAGNOSIS — Z6839 Body mass index (BMI) 39.0-39.9, adult: Secondary | ICD-10-CM | POA: Diagnosis not present

## 2016-08-01 DIAGNOSIS — G4719 Other hypersomnia: Secondary | ICD-10-CM | POA: Diagnosis not present

## 2016-08-01 DIAGNOSIS — I10 Essential (primary) hypertension: Secondary | ICD-10-CM | POA: Diagnosis not present

## 2016-08-01 DIAGNOSIS — R928 Other abnormal and inconclusive findings on diagnostic imaging of breast: Secondary | ICD-10-CM

## 2016-08-01 DIAGNOSIS — Z9989 Dependence on other enabling machines and devices: Secondary | ICD-10-CM | POA: Diagnosis not present

## 2016-08-01 DIAGNOSIS — G4733 Obstructive sleep apnea (adult) (pediatric): Secondary | ICD-10-CM | POA: Diagnosis not present

## 2016-08-01 DIAGNOSIS — N6489 Other specified disorders of breast: Secondary | ICD-10-CM | POA: Diagnosis not present

## 2016-08-01 DIAGNOSIS — N6320 Unspecified lump in the left breast, unspecified quadrant: Secondary | ICD-10-CM

## 2016-08-01 NOTE — Telephone Encounter (Signed)
Attempted to call patient to discuss the note written on 6/25. It looks like I did not forward to [facility] as I intended to. Went to [employer]. Asked patient to call back.   Please let patient know about the following message:  Please call patient to report that her creatinine (kidney function) is good and her potassium is stable. We can proceed with switching the HCTZ with Losartan for kidney protection (and blood pressure) as we discussed on Friday. Ask her to please stop the HCTZ, and we will replace with Losartan 50mg  daily (which I sent to the pharmacy). As we discussed this medication may affect her potasium level, so I would also recommend just taking your potassium tablet 1 tablet twice a day (instead of 1 tablet three times a day). Follow up in 1 week so we can monitor your kidneys and potassium again after making these changes. Keep an eye on your blood pressure as well; let us know if your blood pressure is too low. Thanks.

## 2016-08-02 ENCOUNTER — Ambulatory Visit
Admission: RE | Admit: 2016-08-02 | Discharge: 2016-08-02 | Disposition: A | Discharge status: Home / Self Care | Payer: Medicare HMO | Source: Ambulatory Visit | Attending: Family Medicine | Admitting: Family Medicine

## 2016-08-02 ENCOUNTER — Other Ambulatory Visit: Payer: Self-pay | Admitting: Obstetrics and Gynecology

## 2016-08-02 DIAGNOSIS — M79671 Pain in right foot: Secondary | ICD-10-CM

## 2016-08-02 DIAGNOSIS — N6032 Fibrosclerosis of left breast: Secondary | ICD-10-CM | POA: Diagnosis not present

## 2016-08-02 DIAGNOSIS — N6320 Unspecified lump in the left breast, unspecified quadrant: Secondary | ICD-10-CM

## 2016-08-02 DIAGNOSIS — E119 Type 2 diabetes mellitus without complications: Secondary | ICD-10-CM | POA: Diagnosis not present

## 2016-08-02 DIAGNOSIS — N6321 Unspecified lump in the left breast, upper outer quadrant: Secondary | ICD-10-CM | POA: Diagnosis not present

## 2016-08-02 NOTE — Telephone Encounter (Signed)
Pt contacted and informed of PCP plan. Pt has discontinued her HCTZ and replaced with Lorsartan, this change took place last week. Pt verbalized understanding to take her potassium twice a day instead of 3x daily. Pt has a FU apt 7/5.

## 2016-08-03 DIAGNOSIS — E119 Type 2 diabetes mellitus without complications: Secondary | ICD-10-CM | POA: Diagnosis not present

## 2016-08-03 NOTE — Progress Notes (Signed)
   [facility] Phone: [phone]   Date of Visit: 08/04/2016   HPI:  Patient is here for follow up for DM and Neck pain   DM2:  - Medications: Lantus 18units daily, Novolog 3 units TID.  - she did not bring her CBG logs today but reports that her cbgs have been well controlled with highest cbg to 137. No lows, as her lowest cbg is 100. She has been on this dose for about 1 week. Reports she takes novolog 2 hours after her meal - we also started her on Losartan (switched from HCTZ) for HTN and renal protection  - no polyuria or polydipsia, no nausea  - she has been going to to gym more regularly   Neck Pain:  - reports neck pain has significantly improved. She has been doing the stretching exercises which seem to help. She is able to move her neck like usual without any pain now. She is able to sleep better now  - she had neck pain for about 1 month. She did not have an injury, inciting event, or overuse - no pain radiating into the upper extremities   ROS: See HPI.  Why:  PMH: Obesity DM2 with neuropathy and retinopathy  HTN Pulmonary HTN, Mild HLD Migraine Chronic Venous Insufficiency with Varicose Veins OSA OA Nephrolithiasis, recurrent  Chronic Venous Insufficiency  Urge Incontinence Family History of Heart Disease  PHYSICAL EXAM: BP 126/80   Pulse 81   Temp 98.1 F (36.7 C) (Oral)   Wt 262 lb (118.8 kg)   SpO2 99%   BMI 39.84 kg/m  GEN: NAD CV: RRR, no murmurs, rubs, or gallops PULM: CTAB, normal effort SKIN: No rash or cyanosis; warm and well-perfused EXTR: No lower extremity edema or calf tenderness PSYCH: Mood and affect euthymic, normal rate and volume of speech NEURO: Awake, alert, no focal deficits grossly, normal speech  ASSESSMENT/PLAN:  Health maintenance:  - has an eye appointment   HTN (hypertension) Controlled. Will get BMP today since we started Losartan 1 week ago.   DM (diabetes mellitus), type 2  ([place]) Continue current regimen as her cbgs seem controlled.  Medications: Lantus 18units daily, Novolog 3 units TID.  Asked her to take Novolog right before eating the meal instead of 2hrs after. Patient to call me in 1 week to make sure of continued control. Has an eye appointment   Neck Pain: Neck strain significantly improved. No need to image since improved.  - follow up PRN for neck pain   Smiley Houseman, MD PGY [place]

## 2016-08-04 ENCOUNTER — Encounter: Payer: Self-pay | Admitting: Internal Medicine

## 2016-08-04 ENCOUNTER — Ambulatory Visit (INDEPENDENT_AMBULATORY_CARE_PROVIDER_SITE_OTHER): Payer: Medicare HMO | Admitting: Internal Medicine

## 2016-08-04 VITALS — BP 126/80 | HR 81 | Temp 98.1°F | Wt 262.0 lb

## 2016-08-04 DIAGNOSIS — M542 Cervicalgia: Secondary | ICD-10-CM | POA: Diagnosis not present

## 2016-08-04 DIAGNOSIS — Z794 Long term (current) use of insulin: Secondary | ICD-10-CM

## 2016-08-04 DIAGNOSIS — I10 Essential (primary) hypertension: Secondary | ICD-10-CM

## 2016-08-04 DIAGNOSIS — E118 Type 2 diabetes mellitus with unspecified complications: Secondary | ICD-10-CM | POA: Diagnosis not present

## 2016-08-04 DIAGNOSIS — E119 Type 2 diabetes mellitus without complications: Secondary | ICD-10-CM | POA: Diagnosis not present

## 2016-08-04 NOTE — Assessment & Plan Note (Signed)
Continue current regimen as her cbgs seem controlled.  Medications: Lantus 18units daily, Novolog 3 units TID.  Asked her to take Novolog right before eating the meal instead of 2hrs after. Patient to call me in 1 week to make sure of continued control. Has an eye appointment

## 2016-08-04 NOTE — Patient Instructions (Signed)
Thank you for coming!  Please call me in 1 week to talk about your sugar.

## 2016-08-04 NOTE — Assessment & Plan Note (Signed)
Controlled. Will get BMP today since we started Losartan 1 week ago.

## 2016-08-05 DIAGNOSIS — E119 Type 2 diabetes mellitus without complications: Secondary | ICD-10-CM | POA: Diagnosis not present

## 2016-08-05 LAB — BASIC METABOLIC PANEL
BUN / CREAT RATIO: 14 (ref 9–23)
BUN: 9 mg/dL (ref 6–24)
CO2: 23 mmol/L (ref 20–29)
CREATININE: 0.64 mg/dL (ref 0.57–1.00)
Calcium: 8.6 mg/dL — ABNORMAL LOW (ref 8.7–10.2)
Chloride: 105 mmol/L (ref 96–106)
GFR calc Af Amer: 114 mL/min/{1.73_m2} (ref >59)
GFR, EST NON AFRICAN AMERICAN: 99 mL/min/{1.73_m2} (ref >59)
GLUCOSE: 139 mg/dL — AB (ref 65–99)
Potassium: 3.7 mmol/L (ref 3.5–5.2)
SODIUM: 145 mmol/L — AB (ref 134–144)

## 2016-08-06 DIAGNOSIS — G4733 Obstructive sleep apnea (adult) (pediatric): Secondary | ICD-10-CM | POA: Diagnosis not present

## 2016-08-06 DIAGNOSIS — E119 Type 2 diabetes mellitus without complications: Secondary | ICD-10-CM | POA: Diagnosis not present

## 2016-08-07 DIAGNOSIS — E119 Type 2 diabetes mellitus without complications: Secondary | ICD-10-CM | POA: Diagnosis not present

## 2016-08-08 DIAGNOSIS — E119 Type 2 diabetes mellitus without complications: Secondary | ICD-10-CM | POA: Diagnosis not present

## 2016-08-09 DIAGNOSIS — E119 Type 2 diabetes mellitus without complications: Secondary | ICD-10-CM | POA: Diagnosis not present

## 2016-08-10 DIAGNOSIS — E119 Type 2 diabetes mellitus without complications: Secondary | ICD-10-CM | POA: Diagnosis not present

## 2016-08-11 DIAGNOSIS — E119 Type 2 diabetes mellitus without complications: Secondary | ICD-10-CM | POA: Diagnosis not present

## 2016-08-12 DIAGNOSIS — E119 Type 2 diabetes mellitus without complications: Secondary | ICD-10-CM | POA: Diagnosis not present

## 2016-08-13 DIAGNOSIS — E119 Type 2 diabetes mellitus without complications: Secondary | ICD-10-CM | POA: Diagnosis not present

## 2016-08-14 DIAGNOSIS — E119 Type 2 diabetes mellitus without complications: Secondary | ICD-10-CM | POA: Diagnosis not present

## 2016-08-15 DIAGNOSIS — E119 Type 2 diabetes mellitus without complications: Secondary | ICD-10-CM | POA: Diagnosis not present

## 2016-08-16 DIAGNOSIS — E119 Type 2 diabetes mellitus without complications: Secondary | ICD-10-CM | POA: Diagnosis not present

## 2016-08-17 DIAGNOSIS — E119 Type 2 diabetes mellitus without complications: Secondary | ICD-10-CM | POA: Diagnosis not present

## 2016-08-18 ENCOUNTER — Other Ambulatory Visit: Payer: Self-pay | Admitting: Internal Medicine

## 2016-08-18 DIAGNOSIS — E119 Type 2 diabetes mellitus without complications: Secondary | ICD-10-CM | POA: Diagnosis not present

## 2016-08-19 DIAGNOSIS — E119 Type 2 diabetes mellitus without complications: Secondary | ICD-10-CM | POA: Diagnosis not present

## 2016-08-19 DIAGNOSIS — I10 Essential (primary) hypertension: Secondary | ICD-10-CM | POA: Diagnosis not present

## 2016-08-19 DIAGNOSIS — N39498 Other specified urinary incontinence: Secondary | ICD-10-CM | POA: Diagnosis not present

## 2016-08-19 DIAGNOSIS — N3946 Mixed incontinence: Secondary | ICD-10-CM | POA: Diagnosis not present

## 2016-08-19 DIAGNOSIS — N200 Calculus of kidney: Secondary | ICD-10-CM | POA: Diagnosis not present

## 2016-08-20 DIAGNOSIS — E119 Type 2 diabetes mellitus without complications: Secondary | ICD-10-CM | POA: Diagnosis not present

## 2016-08-21 DIAGNOSIS — E119 Type 2 diabetes mellitus without complications: Secondary | ICD-10-CM | POA: Diagnosis not present

## 2016-08-22 ENCOUNTER — Other Ambulatory Visit: Payer: Self-pay | Admitting: Internal Medicine

## 2016-08-22 DIAGNOSIS — E119 Type 2 diabetes mellitus without complications: Secondary | ICD-10-CM | POA: Diagnosis not present

## 2016-08-23 DIAGNOSIS — E119 Type 2 diabetes mellitus without complications: Secondary | ICD-10-CM | POA: Diagnosis not present

## 2016-08-24 DIAGNOSIS — E119 Type 2 diabetes mellitus without complications: Secondary | ICD-10-CM | POA: Diagnosis not present

## 2016-08-25 DIAGNOSIS — E119 Type 2 diabetes mellitus without complications: Secondary | ICD-10-CM | POA: Diagnosis not present

## 2016-08-26 DIAGNOSIS — N200 Calculus of kidney: Secondary | ICD-10-CM | POA: Diagnosis not present

## 2016-08-26 DIAGNOSIS — E119 Type 2 diabetes mellitus without complications: Secondary | ICD-10-CM | POA: Diagnosis not present

## 2016-08-26 DIAGNOSIS — N2889 Other specified disorders of kidney and ureter: Secondary | ICD-10-CM | POA: Diagnosis not present

## 2016-08-27 DIAGNOSIS — E119 Type 2 diabetes mellitus without complications: Secondary | ICD-10-CM | POA: Diagnosis not present

## 2016-08-28 DIAGNOSIS — E119 Type 2 diabetes mellitus without complications: Secondary | ICD-10-CM | POA: Diagnosis not present

## 2016-08-29 ENCOUNTER — Other Ambulatory Visit: Payer: Self-pay | Admitting: Internal Medicine

## 2016-08-29 DIAGNOSIS — E119 Type 2 diabetes mellitus without complications: Secondary | ICD-10-CM | POA: Diagnosis not present

## 2016-08-30 DIAGNOSIS — E119 Type 2 diabetes mellitus without complications: Secondary | ICD-10-CM | POA: Diagnosis not present

## 2016-08-31 DIAGNOSIS — E119 Type 2 diabetes mellitus without complications: Secondary | ICD-10-CM | POA: Diagnosis not present

## 2016-09-01 DIAGNOSIS — E119 Type 2 diabetes mellitus without complications: Secondary | ICD-10-CM | POA: Diagnosis not present

## 2016-09-02 ENCOUNTER — Other Ambulatory Visit: Payer: Self-pay | Admitting: Internal Medicine

## 2016-09-02 DIAGNOSIS — E119 Type 2 diabetes mellitus without complications: Secondary | ICD-10-CM | POA: Diagnosis not present

## 2016-09-03 DIAGNOSIS — E119 Type 2 diabetes mellitus without complications: Secondary | ICD-10-CM | POA: Diagnosis not present

## 2016-09-04 DIAGNOSIS — E119 Type 2 diabetes mellitus without complications: Secondary | ICD-10-CM | POA: Diagnosis not present

## 2016-09-05 DIAGNOSIS — E119 Type 2 diabetes mellitus without complications: Secondary | ICD-10-CM | POA: Diagnosis not present

## 2016-09-06 DIAGNOSIS — G4733 Obstructive sleep apnea (adult) (pediatric): Secondary | ICD-10-CM | POA: Diagnosis not present

## 2016-09-06 DIAGNOSIS — E119 Type 2 diabetes mellitus without complications: Secondary | ICD-10-CM | POA: Diagnosis not present

## 2016-09-07 DIAGNOSIS — E119 Type 2 diabetes mellitus without complications: Secondary | ICD-10-CM | POA: Diagnosis not present

## 2016-09-08 DIAGNOSIS — E119 Type 2 diabetes mellitus without complications: Secondary | ICD-10-CM | POA: Diagnosis not present

## 2016-09-09 DIAGNOSIS — E119 Type 2 diabetes mellitus without complications: Secondary | ICD-10-CM | POA: Diagnosis not present

## 2016-09-10 DIAGNOSIS — E119 Type 2 diabetes mellitus without complications: Secondary | ICD-10-CM | POA: Diagnosis not present

## 2016-09-11 DIAGNOSIS — E119 Type 2 diabetes mellitus without complications: Secondary | ICD-10-CM | POA: Diagnosis not present

## 2016-09-12 DIAGNOSIS — R109 Unspecified abdominal pain: Secondary | ICD-10-CM | POA: Diagnosis not present

## 2016-09-12 DIAGNOSIS — Z794 Long term (current) use of insulin: Secondary | ICD-10-CM | POA: Diagnosis not present

## 2016-09-12 DIAGNOSIS — K219 Gastro-esophageal reflux disease without esophagitis: Secondary | ICD-10-CM | POA: Diagnosis not present

## 2016-09-12 DIAGNOSIS — Z91041 Radiographic dye allergy status: Secondary | ICD-10-CM | POA: Diagnosis not present

## 2016-09-12 DIAGNOSIS — R1031 Right lower quadrant pain: Secondary | ICD-10-CM | POA: Diagnosis not present

## 2016-09-12 DIAGNOSIS — I10 Essential (primary) hypertension: Secondary | ICD-10-CM | POA: Diagnosis not present

## 2016-09-12 DIAGNOSIS — Z886 Allergy status to analgesic agent status: Secondary | ICD-10-CM | POA: Diagnosis not present

## 2016-09-12 DIAGNOSIS — Z91013 Allergy to seafood: Secondary | ICD-10-CM | POA: Diagnosis not present

## 2016-09-12 DIAGNOSIS — N132 Hydronephrosis with renal and ureteral calculous obstruction: Secondary | ICD-10-CM | POA: Diagnosis not present

## 2016-09-12 DIAGNOSIS — Z79899 Other long term (current) drug therapy: Secondary | ICD-10-CM | POA: Diagnosis not present

## 2016-09-12 DIAGNOSIS — G4733 Obstructive sleep apnea (adult) (pediatric): Secondary | ICD-10-CM | POA: Diagnosis not present

## 2016-09-12 DIAGNOSIS — E119 Type 2 diabetes mellitus without complications: Secondary | ICD-10-CM | POA: Diagnosis not present

## 2016-09-12 DIAGNOSIS — N2882 Megaloureter: Secondary | ICD-10-CM | POA: Diagnosis not present

## 2016-09-12 DIAGNOSIS — N200 Calculus of kidney: Secondary | ICD-10-CM | POA: Diagnosis not present

## 2016-09-12 DIAGNOSIS — Z791 Long term (current) use of non-steroidal anti-inflammatories (NSAID): Secondary | ICD-10-CM | POA: Diagnosis not present

## 2016-09-12 DIAGNOSIS — R1084 Generalized abdominal pain: Secondary | ICD-10-CM | POA: Diagnosis not present

## 2016-09-12 DIAGNOSIS — K5730 Diverticulosis of large intestine without perforation or abscess without bleeding: Secondary | ICD-10-CM | POA: Diagnosis not present

## 2016-09-13 ENCOUNTER — Other Ambulatory Visit: Payer: Self-pay | Admitting: Student

## 2016-09-13 ENCOUNTER — Other Ambulatory Visit: Payer: Self-pay | Admitting: *Deleted

## 2016-09-13 DIAGNOSIS — E119 Type 2 diabetes mellitus without complications: Secondary | ICD-10-CM | POA: Diagnosis not present

## 2016-09-13 MED ORDER — POTASSIUM CITRATE ER 15 MEQ (1620 MG) PO TBCR: 1.0000 | EXTENDED_RELEASE_TABLET | Freq: Three times a day (TID) | ORAL | 0 refills | Status: DC

## 2016-09-13 NOTE — Telephone Encounter (Signed)
Called patient who reports that she was recently in the hospital for kidney stones. She had surgery for them. She came home yesterday but had to go back to that emergency department due to pain. She is doing better today. I called to review her medication. She is taking potassium 1 tablet 3 times a day per her urologist. She does not need another glucometer. Therefore I will not refill that.  For her diabetes she is currently on Lantus 16 and NovoLog 3 3 times a day. Her CBGs have been well-controlled all below 150.  She will make a follow-up visit with me after she gets better from her kidney sounds.

## 2016-09-14 DIAGNOSIS — E119 Type 2 diabetes mellitus without complications: Secondary | ICD-10-CM | POA: Diagnosis not present

## 2016-09-15 DIAGNOSIS — E119 Type 2 diabetes mellitus without complications: Secondary | ICD-10-CM | POA: Diagnosis not present

## 2016-09-16 DIAGNOSIS — E119 Type 2 diabetes mellitus without complications: Secondary | ICD-10-CM | POA: Diagnosis not present

## 2016-09-17 DIAGNOSIS — E119 Type 2 diabetes mellitus without complications: Secondary | ICD-10-CM | POA: Diagnosis not present

## 2016-09-18 DIAGNOSIS — E119 Type 2 diabetes mellitus without complications: Secondary | ICD-10-CM | POA: Diagnosis not present

## 2016-09-19 DIAGNOSIS — N3946 Mixed incontinence: Secondary | ICD-10-CM | POA: Diagnosis not present

## 2016-09-19 DIAGNOSIS — H01021 Squamous blepharitis right upper eyelid: Secondary | ICD-10-CM | POA: Diagnosis not present

## 2016-09-19 DIAGNOSIS — H01024 Squamous blepharitis left upper eyelid: Secondary | ICD-10-CM | POA: Diagnosis not present

## 2016-09-19 DIAGNOSIS — E118 Type 2 diabetes mellitus with unspecified complications: Secondary | ICD-10-CM | POA: Diagnosis not present

## 2016-09-19 DIAGNOSIS — N200 Calculus of kidney: Secondary | ICD-10-CM | POA: Diagnosis not present

## 2016-09-19 DIAGNOSIS — I10 Essential (primary) hypertension: Secondary | ICD-10-CM | POA: Diagnosis not present

## 2016-09-19 DIAGNOSIS — E113293 Type 2 diabetes mellitus with mild nonproliferative diabetic retinopathy without macular edema, bilateral: Secondary | ICD-10-CM | POA: Diagnosis not present

## 2016-09-19 DIAGNOSIS — H2513 Age-related nuclear cataract, bilateral: Secondary | ICD-10-CM | POA: Diagnosis not present

## 2016-09-19 DIAGNOSIS — N39498 Other specified urinary incontinence: Secondary | ICD-10-CM | POA: Diagnosis not present

## 2016-09-19 DIAGNOSIS — H01022 Squamous blepharitis right lower eyelid: Secondary | ICD-10-CM | POA: Diagnosis not present

## 2016-09-19 DIAGNOSIS — E119 Type 2 diabetes mellitus without complications: Secondary | ICD-10-CM | POA: Diagnosis not present

## 2016-09-19 DIAGNOSIS — H01025 Squamous blepharitis left lower eyelid: Secondary | ICD-10-CM | POA: Diagnosis not present

## 2016-09-20 DIAGNOSIS — E119 Type 2 diabetes mellitus without complications: Secondary | ICD-10-CM | POA: Diagnosis not present

## 2016-09-21 ENCOUNTER — Other Ambulatory Visit: Payer: Self-pay | Admitting: Obstetrics

## 2016-09-21 DIAGNOSIS — E119 Type 2 diabetes mellitus without complications: Secondary | ICD-10-CM | POA: Diagnosis not present

## 2016-09-22 DIAGNOSIS — E119 Type 2 diabetes mellitus without complications: Secondary | ICD-10-CM | POA: Diagnosis not present

## 2016-09-23 DIAGNOSIS — E119 Type 2 diabetes mellitus without complications: Secondary | ICD-10-CM | POA: Diagnosis not present

## 2016-09-24 DIAGNOSIS — E119 Type 2 diabetes mellitus without complications: Secondary | ICD-10-CM | POA: Diagnosis not present

## 2016-09-25 DIAGNOSIS — E119 Type 2 diabetes mellitus without complications: Secondary | ICD-10-CM | POA: Diagnosis not present

## 2016-09-26 DIAGNOSIS — E119 Type 2 diabetes mellitus without complications: Secondary | ICD-10-CM | POA: Diagnosis not present

## 2016-09-27 DIAGNOSIS — E119 Type 2 diabetes mellitus without complications: Secondary | ICD-10-CM | POA: Diagnosis not present

## 2016-09-28 DIAGNOSIS — E119 Type 2 diabetes mellitus without complications: Secondary | ICD-10-CM | POA: Diagnosis not present

## 2016-09-29 DIAGNOSIS — E119 Type 2 diabetes mellitus without complications: Secondary | ICD-10-CM | POA: Diagnosis not present

## 2016-09-30 ENCOUNTER — Other Ambulatory Visit: Payer: Self-pay | Admitting: Obstetrics

## 2016-09-30 DIAGNOSIS — E119 Type 2 diabetes mellitus without complications: Secondary | ICD-10-CM | POA: Diagnosis not present

## 2016-10-01 DIAGNOSIS — E119 Type 2 diabetes mellitus without complications: Secondary | ICD-10-CM | POA: Diagnosis not present

## 2016-10-02 DIAGNOSIS — E119 Type 2 diabetes mellitus without complications: Secondary | ICD-10-CM | POA: Diagnosis not present

## 2016-10-03 DIAGNOSIS — E119 Type 2 diabetes mellitus without complications: Secondary | ICD-10-CM | POA: Diagnosis not present

## 2016-10-04 ENCOUNTER — Other Ambulatory Visit: Payer: Self-pay | Admitting: Internal Medicine

## 2016-10-04 DIAGNOSIS — E119 Type 2 diabetes mellitus without complications: Secondary | ICD-10-CM | POA: Diagnosis not present

## 2016-10-04 DIAGNOSIS — K219 Gastro-esophageal reflux disease without esophagitis: Secondary | ICD-10-CM

## 2016-10-05 DIAGNOSIS — E119 Type 2 diabetes mellitus without complications: Secondary | ICD-10-CM | POA: Diagnosis not present

## 2016-10-06 DIAGNOSIS — E119 Type 2 diabetes mellitus without complications: Secondary | ICD-10-CM | POA: Diagnosis not present

## 2016-10-07 DIAGNOSIS — G4733 Obstructive sleep apnea (adult) (pediatric): Secondary | ICD-10-CM | POA: Diagnosis not present

## 2016-10-07 DIAGNOSIS — E119 Type 2 diabetes mellitus without complications: Secondary | ICD-10-CM | POA: Diagnosis not present

## 2016-10-08 DIAGNOSIS — E119 Type 2 diabetes mellitus without complications: Secondary | ICD-10-CM | POA: Diagnosis not present

## 2016-10-09 DIAGNOSIS — E119 Type 2 diabetes mellitus without complications: Secondary | ICD-10-CM | POA: Diagnosis not present

## 2016-10-10 DIAGNOSIS — E119 Type 2 diabetes mellitus without complications: Secondary | ICD-10-CM | POA: Diagnosis not present

## 2016-10-11 DIAGNOSIS — E119 Type 2 diabetes mellitus without complications: Secondary | ICD-10-CM | POA: Diagnosis not present

## 2016-10-12 DIAGNOSIS — E119 Type 2 diabetes mellitus without complications: Secondary | ICD-10-CM | POA: Diagnosis not present

## 2016-10-13 DIAGNOSIS — E119 Type 2 diabetes mellitus without complications: Secondary | ICD-10-CM | POA: Diagnosis not present

## 2016-10-14 DIAGNOSIS — E119 Type 2 diabetes mellitus without complications: Secondary | ICD-10-CM | POA: Diagnosis not present

## 2016-10-15 DIAGNOSIS — E119 Type 2 diabetes mellitus without complications: Secondary | ICD-10-CM | POA: Diagnosis not present

## 2016-10-16 DIAGNOSIS — E119 Type 2 diabetes mellitus without complications: Secondary | ICD-10-CM | POA: Diagnosis not present

## 2016-10-17 DIAGNOSIS — E119 Type 2 diabetes mellitus without complications: Secondary | ICD-10-CM | POA: Diagnosis not present

## 2016-10-18 DIAGNOSIS — G4733 Obstructive sleep apnea (adult) (pediatric): Secondary | ICD-10-CM | POA: Diagnosis not present

## 2016-10-18 DIAGNOSIS — E119 Type 2 diabetes mellitus without complications: Secondary | ICD-10-CM | POA: Diagnosis not present

## 2016-10-19 DIAGNOSIS — E119 Type 2 diabetes mellitus without complications: Secondary | ICD-10-CM | POA: Diagnosis not present

## 2016-10-20 DIAGNOSIS — N200 Calculus of kidney: Secondary | ICD-10-CM | POA: Diagnosis not present

## 2016-10-20 DIAGNOSIS — R32 Unspecified urinary incontinence: Secondary | ICD-10-CM | POA: Diagnosis not present

## 2016-10-20 DIAGNOSIS — R109 Unspecified abdominal pain: Secondary | ICD-10-CM | POA: Diagnosis not present

## 2016-10-20 DIAGNOSIS — E119 Type 2 diabetes mellitus without complications: Secondary | ICD-10-CM | POA: Diagnosis not present

## 2016-10-21 DIAGNOSIS — E119 Type 2 diabetes mellitus without complications: Secondary | ICD-10-CM | POA: Diagnosis not present

## 2016-10-22 DIAGNOSIS — E119 Type 2 diabetes mellitus without complications: Secondary | ICD-10-CM | POA: Diagnosis not present

## 2016-10-23 DIAGNOSIS — E119 Type 2 diabetes mellitus without complications: Secondary | ICD-10-CM | POA: Diagnosis not present

## 2016-10-24 ENCOUNTER — Other Ambulatory Visit: Payer: Self-pay | Admitting: Internal Medicine

## 2016-10-24 DIAGNOSIS — E119 Type 2 diabetes mellitus without complications: Secondary | ICD-10-CM | POA: Diagnosis not present

## 2016-10-25 DIAGNOSIS — E119 Type 2 diabetes mellitus without complications: Secondary | ICD-10-CM | POA: Diagnosis not present

## 2016-10-26 DIAGNOSIS — E119 Type 2 diabetes mellitus without complications: Secondary | ICD-10-CM | POA: Diagnosis not present

## 2016-10-27 DIAGNOSIS — E119 Type 2 diabetes mellitus without complications: Secondary | ICD-10-CM | POA: Diagnosis not present

## 2016-10-28 DIAGNOSIS — E119 Type 2 diabetes mellitus without complications: Secondary | ICD-10-CM | POA: Diagnosis not present

## 2016-10-29 DIAGNOSIS — E119 Type 2 diabetes mellitus without complications: Secondary | ICD-10-CM | POA: Diagnosis not present

## 2016-10-30 DIAGNOSIS — E119 Type 2 diabetes mellitus without complications: Secondary | ICD-10-CM | POA: Diagnosis not present

## 2016-10-31 DIAGNOSIS — E119 Type 2 diabetes mellitus without complications: Secondary | ICD-10-CM | POA: Diagnosis not present

## 2016-11-01 ENCOUNTER — Other Ambulatory Visit: Payer: Self-pay | Admitting: Internal Medicine

## 2016-11-01 DIAGNOSIS — I10 Essential (primary) hypertension: Secondary | ICD-10-CM

## 2016-11-01 DIAGNOSIS — E119 Type 2 diabetes mellitus without complications: Secondary | ICD-10-CM | POA: Diagnosis not present

## 2016-11-02 DIAGNOSIS — E119 Type 2 diabetes mellitus without complications: Secondary | ICD-10-CM | POA: Diagnosis not present

## 2016-11-03 DIAGNOSIS — E119 Type 2 diabetes mellitus without complications: Secondary | ICD-10-CM | POA: Diagnosis not present

## 2016-11-04 DIAGNOSIS — E119 Type 2 diabetes mellitus without complications: Secondary | ICD-10-CM | POA: Diagnosis not present

## 2016-11-05 DIAGNOSIS — E119 Type 2 diabetes mellitus without complications: Secondary | ICD-10-CM | POA: Diagnosis not present

## 2016-11-06 DIAGNOSIS — E119 Type 2 diabetes mellitus without complications: Secondary | ICD-10-CM | POA: Diagnosis not present

## 2016-11-06 DIAGNOSIS — G4733 Obstructive sleep apnea (adult) (pediatric): Secondary | ICD-10-CM | POA: Diagnosis not present

## 2016-11-07 ENCOUNTER — Other Ambulatory Visit: Payer: Self-pay | Admitting: Internal Medicine

## 2016-11-07 DIAGNOSIS — E119 Type 2 diabetes mellitus without complications: Secondary | ICD-10-CM | POA: Diagnosis not present

## 2016-11-08 DIAGNOSIS — E119 Type 2 diabetes mellitus without complications: Secondary | ICD-10-CM | POA: Diagnosis not present

## 2016-11-09 DIAGNOSIS — E119 Type 2 diabetes mellitus without complications: Secondary | ICD-10-CM | POA: Diagnosis not present

## 2016-11-10 DIAGNOSIS — E119 Type 2 diabetes mellitus without complications: Secondary | ICD-10-CM | POA: Diagnosis not present

## 2016-11-11 DIAGNOSIS — G4733 Obstructive sleep apnea (adult) (pediatric): Secondary | ICD-10-CM | POA: Diagnosis not present

## 2016-11-11 DIAGNOSIS — E119 Type 2 diabetes mellitus without complications: Secondary | ICD-10-CM | POA: Diagnosis not present

## 2016-11-12 DIAGNOSIS — E119 Type 2 diabetes mellitus without complications: Secondary | ICD-10-CM | POA: Diagnosis not present

## 2016-11-13 DIAGNOSIS — E119 Type 2 diabetes mellitus without complications: Secondary | ICD-10-CM | POA: Diagnosis not present

## 2016-11-14 ENCOUNTER — Telehealth: Payer: Self-pay | Admitting: Internal Medicine

## 2016-11-14 DIAGNOSIS — E119 Type 2 diabetes mellitus without complications: Secondary | ICD-10-CM | POA: Diagnosis not present

## 2016-11-14 NOTE — Telephone Encounter (Signed)
Called patient to review sugars. Patient is currently busy. I will call her in the morning on Wednesday.

## 2016-11-15 DIAGNOSIS — E119 Type 2 diabetes mellitus without complications: Secondary | ICD-10-CM | POA: Diagnosis not present

## 2016-11-16 DIAGNOSIS — E119 Type 2 diabetes mellitus without complications: Secondary | ICD-10-CM | POA: Diagnosis not present

## 2016-11-16 NOTE — Telephone Encounter (Signed)
Attempted to call patient this morning. Went to [employer]. Left message.

## 2016-11-16 NOTE — Telephone Encounter (Signed)
Called patient. Went to [employer]. Left message asking to make follow up appointment for DM2.

## 2016-11-17 ENCOUNTER — Other Ambulatory Visit: Payer: Self-pay | Admitting: Internal Medicine

## 2016-11-17 DIAGNOSIS — E119 Type 2 diabetes mellitus without complications: Secondary | ICD-10-CM | POA: Diagnosis not present

## 2016-11-18 DIAGNOSIS — E119 Type 2 diabetes mellitus without complications: Secondary | ICD-10-CM | POA: Diagnosis not present

## 2016-11-19 DIAGNOSIS — E119 Type 2 diabetes mellitus without complications: Secondary | ICD-10-CM | POA: Diagnosis not present

## 2016-11-20 DIAGNOSIS — E119 Type 2 diabetes mellitus without complications: Secondary | ICD-10-CM | POA: Diagnosis not present

## 2016-11-21 DIAGNOSIS — E119 Type 2 diabetes mellitus without complications: Secondary | ICD-10-CM | POA: Diagnosis not present

## 2016-11-22 DIAGNOSIS — I10 Essential (primary) hypertension: Secondary | ICD-10-CM | POA: Diagnosis not present

## 2016-11-22 DIAGNOSIS — E119 Type 2 diabetes mellitus without complications: Secondary | ICD-10-CM | POA: Diagnosis not present

## 2016-11-22 DIAGNOSIS — Z6841 Body Mass Index (BMI) 40.0 and over, adult: Secondary | ICD-10-CM | POA: Diagnosis not present

## 2016-11-22 DIAGNOSIS — Z9989 Dependence on other enabling machines and devices: Secondary | ICD-10-CM | POA: Diagnosis not present

## 2016-11-22 DIAGNOSIS — G4733 Obstructive sleep apnea (adult) (pediatric): Secondary | ICD-10-CM | POA: Diagnosis not present

## 2016-11-22 DIAGNOSIS — Z23 Encounter for immunization: Secondary | ICD-10-CM | POA: Diagnosis not present

## 2016-11-23 ENCOUNTER — Other Ambulatory Visit: Payer: Self-pay | Admitting: Internal Medicine

## 2016-11-23 DIAGNOSIS — E119 Type 2 diabetes mellitus without complications: Secondary | ICD-10-CM | POA: Diagnosis not present

## 2016-11-24 DIAGNOSIS — E119 Type 2 diabetes mellitus without complications: Secondary | ICD-10-CM | POA: Diagnosis not present

## 2016-11-25 DIAGNOSIS — E119 Type 2 diabetes mellitus without complications: Secondary | ICD-10-CM | POA: Diagnosis not present

## 2016-11-26 DIAGNOSIS — E119 Type 2 diabetes mellitus without complications: Secondary | ICD-10-CM | POA: Diagnosis not present

## 2016-11-27 DIAGNOSIS — E119 Type 2 diabetes mellitus without complications: Secondary | ICD-10-CM | POA: Diagnosis not present

## 2016-11-28 DIAGNOSIS — E119 Type 2 diabetes mellitus without complications: Secondary | ICD-10-CM | POA: Diagnosis not present

## 2016-11-29 DIAGNOSIS — E119 Type 2 diabetes mellitus without complications: Secondary | ICD-10-CM | POA: Diagnosis not present

## 2016-11-29 NOTE — Progress Notes (Deleted)
   [facility] Phone: [phone]   Date of Visit: 12/01/2016   HPI:  DM2: - A1c: 10 (07/2016) <13.2 (03/2016) - Medications:  - cbgs:  ROS: See HPI.  [place]:  PMH: Obesity DM2 with neuropathy and retinopathy  HTN Pulmonary HTN, Mild HLD Migraine Chronic Venous Insufficiency with Varicose Veins OSA OA Nephrolithiasis, recurrent  Chronic Venous Insufficiency  Urge Incontinence Family History of Heart Disease  PHYSICAL EXAM: There were no vitals taken for this visit. Gen: *** HEENT: *** Heart: *** Lungs: *** Neuro: *** Ext: ***  ASSESSMENT/PLAN:  Health maintenance:  -***  No problem-specific Assessment & Plan notes found for this encounter.  FOLLOW UP: Follow up in *** for ***  Smiley Houseman, MD PGY [place]

## 2016-11-30 DIAGNOSIS — E119 Type 2 diabetes mellitus without complications: Secondary | ICD-10-CM | POA: Diagnosis not present

## 2016-12-01 ENCOUNTER — Ambulatory Visit: Payer: Medicare HMO | Admitting: Internal Medicine

## 2016-12-01 DIAGNOSIS — E119 Type 2 diabetes mellitus without complications: Secondary | ICD-10-CM | POA: Diagnosis not present

## 2016-12-02 DIAGNOSIS — E119 Type 2 diabetes mellitus without complications: Secondary | ICD-10-CM | POA: Diagnosis not present

## 2016-12-03 DIAGNOSIS — E119 Type 2 diabetes mellitus without complications: Secondary | ICD-10-CM | POA: Diagnosis not present

## 2016-12-04 DIAGNOSIS — E119 Type 2 diabetes mellitus without complications: Secondary | ICD-10-CM | POA: Diagnosis not present

## 2016-12-05 DIAGNOSIS — E119 Type 2 diabetes mellitus without complications: Secondary | ICD-10-CM | POA: Diagnosis not present

## 2016-12-06 DIAGNOSIS — E119 Type 2 diabetes mellitus without complications: Secondary | ICD-10-CM | POA: Diagnosis not present

## 2016-12-07 DIAGNOSIS — G4733 Obstructive sleep apnea (adult) (pediatric): Secondary | ICD-10-CM | POA: Diagnosis not present

## 2016-12-07 DIAGNOSIS — E119 Type 2 diabetes mellitus without complications: Secondary | ICD-10-CM | POA: Diagnosis not present

## 2016-12-08 DIAGNOSIS — E119 Type 2 diabetes mellitus without complications: Secondary | ICD-10-CM | POA: Diagnosis not present

## 2016-12-09 DIAGNOSIS — E118 Type 2 diabetes mellitus with unspecified complications: Secondary | ICD-10-CM | POA: Diagnosis not present

## 2016-12-09 DIAGNOSIS — N200 Calculus of kidney: Secondary | ICD-10-CM | POA: Diagnosis not present

## 2016-12-09 DIAGNOSIS — N3946 Mixed incontinence: Secondary | ICD-10-CM | POA: Diagnosis not present

## 2016-12-09 DIAGNOSIS — N39498 Other specified urinary incontinence: Secondary | ICD-10-CM | POA: Diagnosis not present

## 2016-12-09 DIAGNOSIS — E119 Type 2 diabetes mellitus without complications: Secondary | ICD-10-CM | POA: Diagnosis not present

## 2016-12-09 DIAGNOSIS — R32 Unspecified urinary incontinence: Secondary | ICD-10-CM | POA: Diagnosis not present

## 2016-12-09 DIAGNOSIS — I10 Essential (primary) hypertension: Secondary | ICD-10-CM | POA: Diagnosis not present

## 2016-12-10 DIAGNOSIS — E119 Type 2 diabetes mellitus without complications: Secondary | ICD-10-CM | POA: Diagnosis not present

## 2016-12-11 DIAGNOSIS — E119 Type 2 diabetes mellitus without complications: Secondary | ICD-10-CM | POA: Diagnosis not present

## 2016-12-11 NOTE — Progress Notes (Deleted)
   [facility] Phone: [phone]   Date of Visit: 12/13/2016   HPI:  DM2, uncontrolled:  - Hemoglobin A1c 10 (07/2016) <13.2 (03/2016) < 8.1 (12/2015) - Medications:  - CBGS - compliance   ROS: See HPI.  [place]:  PMH: Obesity DM2 with neuropathy and retinopathy  HTN Pulmonary HTN, Mild HLD Migraine Chronic Venous Insufficiency with Varicose Veins OSA OA Nephrolithiasis, recurrent  Chronic Venous Insufficiency  Urge Incontinence Family History of Heart Disease GERD  PHYSICAL EXAM: There were no vitals taken for this visit. Gen: *** HEENT: *** Heart: *** Lungs: *** Neuro: *** Ext: ***  ASSESSMENT/PLAN:  Health maintenance:  -***  No problem-specific Assessment & Plan notes found for this encounter.  FOLLOW UP: Follow up in *** for ***  Smiley Houseman, MD PGY [place]

## 2016-12-12 DIAGNOSIS — E119 Type 2 diabetes mellitus without complications: Secondary | ICD-10-CM | POA: Diagnosis not present

## 2016-12-13 ENCOUNTER — Ambulatory Visit: Payer: Medicare HMO | Admitting: Internal Medicine

## 2016-12-13 DIAGNOSIS — E119 Type 2 diabetes mellitus without complications: Secondary | ICD-10-CM | POA: Diagnosis not present

## 2016-12-14 DIAGNOSIS — E119 Type 2 diabetes mellitus without complications: Secondary | ICD-10-CM | POA: Diagnosis not present

## 2016-12-15 DIAGNOSIS — E119 Type 2 diabetes mellitus without complications: Secondary | ICD-10-CM | POA: Diagnosis not present

## 2016-12-16 DIAGNOSIS — E119 Type 2 diabetes mellitus without complications: Secondary | ICD-10-CM | POA: Diagnosis not present

## 2016-12-17 DIAGNOSIS — E119 Type 2 diabetes mellitus without complications: Secondary | ICD-10-CM | POA: Diagnosis not present

## 2016-12-18 DIAGNOSIS — E119 Type 2 diabetes mellitus without complications: Secondary | ICD-10-CM | POA: Diagnosis not present

## 2016-12-19 DIAGNOSIS — E119 Type 2 diabetes mellitus without complications: Secondary | ICD-10-CM | POA: Diagnosis not present

## 2016-12-20 DIAGNOSIS — E119 Type 2 diabetes mellitus without complications: Secondary | ICD-10-CM | POA: Diagnosis not present

## 2016-12-21 DIAGNOSIS — E119 Type 2 diabetes mellitus without complications: Secondary | ICD-10-CM | POA: Diagnosis not present

## 2016-12-22 DIAGNOSIS — E119 Type 2 diabetes mellitus without complications: Secondary | ICD-10-CM | POA: Diagnosis not present

## 2016-12-23 DIAGNOSIS — E119 Type 2 diabetes mellitus without complications: Secondary | ICD-10-CM | POA: Diagnosis not present

## 2016-12-24 DIAGNOSIS — E119 Type 2 diabetes mellitus without complications: Secondary | ICD-10-CM | POA: Diagnosis not present

## 2016-12-25 DIAGNOSIS — E119 Type 2 diabetes mellitus without complications: Secondary | ICD-10-CM | POA: Diagnosis not present

## 2016-12-26 DIAGNOSIS — E119 Type 2 diabetes mellitus without complications: Secondary | ICD-10-CM | POA: Diagnosis not present

## 2016-12-27 DIAGNOSIS — E119 Type 2 diabetes mellitus without complications: Secondary | ICD-10-CM | POA: Diagnosis not present

## 2016-12-28 DIAGNOSIS — E119 Type 2 diabetes mellitus without complications: Secondary | ICD-10-CM | POA: Diagnosis not present

## 2016-12-29 DIAGNOSIS — E119 Type 2 diabetes mellitus without complications: Secondary | ICD-10-CM | POA: Diagnosis not present

## 2016-12-30 DIAGNOSIS — E119 Type 2 diabetes mellitus without complications: Secondary | ICD-10-CM | POA: Diagnosis not present

## 2016-12-31 DIAGNOSIS — E119 Type 2 diabetes mellitus without complications: Secondary | ICD-10-CM | POA: Diagnosis not present

## 2017-01-01 DIAGNOSIS — E119 Type 2 diabetes mellitus without complications: Secondary | ICD-10-CM | POA: Diagnosis not present

## 2017-01-02 DIAGNOSIS — E119 Type 2 diabetes mellitus without complications: Secondary | ICD-10-CM | POA: Diagnosis not present

## 2017-01-03 DIAGNOSIS — E119 Type 2 diabetes mellitus without complications: Secondary | ICD-10-CM | POA: Diagnosis not present

## 2017-01-04 DIAGNOSIS — E119 Type 2 diabetes mellitus without complications: Secondary | ICD-10-CM | POA: Diagnosis not present

## 2017-01-05 DIAGNOSIS — E119 Type 2 diabetes mellitus without complications: Secondary | ICD-10-CM | POA: Diagnosis not present

## 2017-01-06 DIAGNOSIS — E119 Type 2 diabetes mellitus without complications: Secondary | ICD-10-CM | POA: Diagnosis not present

## 2017-01-06 DIAGNOSIS — G4733 Obstructive sleep apnea (adult) (pediatric): Secondary | ICD-10-CM | POA: Diagnosis not present

## 2017-01-07 DIAGNOSIS — E119 Type 2 diabetes mellitus without complications: Secondary | ICD-10-CM | POA: Diagnosis not present

## 2017-01-08 DIAGNOSIS — E119 Type 2 diabetes mellitus without complications: Secondary | ICD-10-CM | POA: Diagnosis not present

## 2017-01-09 DIAGNOSIS — E119 Type 2 diabetes mellitus without complications: Secondary | ICD-10-CM | POA: Diagnosis not present

## 2017-01-10 DIAGNOSIS — N3946 Mixed incontinence: Secondary | ICD-10-CM | POA: Diagnosis not present

## 2017-01-10 DIAGNOSIS — I10 Essential (primary) hypertension: Secondary | ICD-10-CM | POA: Diagnosis not present

## 2017-01-10 DIAGNOSIS — N39498 Other specified urinary incontinence: Secondary | ICD-10-CM | POA: Diagnosis not present

## 2017-01-10 DIAGNOSIS — E119 Type 2 diabetes mellitus without complications: Secondary | ICD-10-CM | POA: Diagnosis not present

## 2017-01-10 DIAGNOSIS — N200 Calculus of kidney: Secondary | ICD-10-CM | POA: Diagnosis not present

## 2017-01-11 DIAGNOSIS — E119 Type 2 diabetes mellitus without complications: Secondary | ICD-10-CM | POA: Diagnosis not present

## 2017-01-12 DIAGNOSIS — E119 Type 2 diabetes mellitus without complications: Secondary | ICD-10-CM | POA: Diagnosis not present

## 2017-01-13 DIAGNOSIS — E119 Type 2 diabetes mellitus without complications: Secondary | ICD-10-CM | POA: Diagnosis not present

## 2017-01-14 DIAGNOSIS — E119 Type 2 diabetes mellitus without complications: Secondary | ICD-10-CM | POA: Diagnosis not present

## 2017-01-15 DIAGNOSIS — E119 Type 2 diabetes mellitus without complications: Secondary | ICD-10-CM | POA: Diagnosis not present

## 2017-01-15 NOTE — Progress Notes (Signed)
   [facility] Phone: [phone]   Date of Visit: 01/16/2017   HPI:  DM2:  - Medication: Lantus 20 units daily. She has not been taking novolog for the past month because it makes her feel dizzy. She does check her cbg during these symptoms and her cbg is not low - she brought her meter today. She only checks her sugar once a day. We discussed the need to check TID. Her AM fasting is mainly in 170s up to 204.   Left Knee Pain:  - acute on chronic. Symptoms started about 3 years ago.  - symptoms worsened 4 months ago. She describes as a stabbing pain  - no buckling or catching  - she was seen by an orthopedic doctor Dr. Andee Poles in [place] and she was told that she needed surgery on her knee - no recent injury or trauma - no swelling - has tried heat and rubbing her knee - no weakness or numbness of the leg  - xray of knee from 01/2014 showed mild medial compartment OA  ROS: See HPI.  [place]:  PMH: HTN DM2 with retinopathy and neuropathy Systolic Heart murmur HLD Chronic Venous Insufficiency Mild Pulmonary HTN Migraine OSA OA knee Hx of recurrent nephrolithiasis Urge Incontinence Obesity GERD FH of early heart disease  PHYSICAL EXAM: BP 122/70   Pulse 83   Temp 98 F (36.7 C) (Oral)   Ht 5\' 8"  (1.727 m)   Wt 264 lb (119.7 kg)   SpO2 95%   BMI 40.14 kg/m  GEN: NAD  CV: RRR, no murmurs, rubs, or gallops PULM: CTAB, normal effort SKIN: No rash or cyanosis; warm and well-perfused EXTR: No lower extremity edema or calf tenderness MSK: no swelling of the left knee and no overlying erythema or increased warmth. Tenderness to palpation of the medial joint line only. Ligaments are intact. Range of motion is normal. Tessaly test is negative.  PSYCH: Mood and affect euthymic, normal rate and volume of speech NEURO: Awake, alert, no focal deficits grossly, normal speech   ASSESSMENT/PLAN:  Health maintenance:  - discussed indication for  shringrix. Patient would like to get vaccine. rx sent to pharmacy. Discussed common side effects.   DM (diabetes mellitus), type 2 (HCC) Lantus 18 units daily Novolog 3 units three times a day with meal Check sugars three times a day before meals. And then twice a week also check 2 hours after a meal.  Patient to call me in 1 week to review sugars  Follow up in 2 weeks.    Left Knee Pain: acute on chronic. No sign of meniscal tear/injury. Ligaments intact. Likely OA.  - xray of knee - Naproxen 500mg  BID for 3-4 days (with food) then take as needed - referral to PT  Smiley Houseman, MD PGY [place]

## 2017-01-16 ENCOUNTER — Other Ambulatory Visit: Payer: Self-pay

## 2017-01-16 ENCOUNTER — Ambulatory Visit (INDEPENDENT_AMBULATORY_CARE_PROVIDER_SITE_OTHER): Payer: Medicare HMO | Admitting: Internal Medicine

## 2017-01-16 ENCOUNTER — Encounter: Payer: Self-pay | Admitting: Internal Medicine

## 2017-01-16 VITALS — BP 122/70 | HR 83 | Temp 98.0°F | Ht 68.0 in | Wt 264.0 lb

## 2017-01-16 DIAGNOSIS — G8929 Other chronic pain: Secondary | ICD-10-CM

## 2017-01-16 DIAGNOSIS — M25562 Pain in left knee: Secondary | ICD-10-CM | POA: Diagnosis not present

## 2017-01-16 DIAGNOSIS — E1142 Type 2 diabetes mellitus with diabetic polyneuropathy: Secondary | ICD-10-CM

## 2017-01-16 DIAGNOSIS — E119 Type 2 diabetes mellitus without complications: Secondary | ICD-10-CM | POA: Diagnosis not present

## 2017-01-16 LAB — POCT GLYCOSYLATED HEMOGLOBIN (HGB A1C): Hemoglobin A1C: 9.9

## 2017-01-16 MED ORDER — LOSARTAN POTASSIUM 50 MG PO TABS: 50.0000 mg | ORAL_TABLET | Freq: Every day | ORAL | 2 refills | Status: DC

## 2017-01-16 MED ORDER — ZOSTER VAC RECOMB ADJUVANTED 50 MCG/0.5ML IM SUSR: 0.5000 mL | Freq: Once | INTRAMUSCULAR | 1 refills | Status: AC

## 2017-01-16 MED ORDER — POTASSIUM CITRATE ER 15 MEQ (1620 MG) PO TBCR
1.0000 | EXTENDED_RELEASE_TABLET | Freq: Three times a day (TID) | ORAL | 0 refills | Status: AC
Start: 2017-01-16 — End: ?

## 2017-01-16 MED ORDER — NAPROXEN 500 MG PO TABS: 500.0000 mg | ORAL_TABLET | Freq: Two times a day (BID) | ORAL | 0 refills | Status: DC

## 2017-01-16 NOTE — Patient Instructions (Addendum)
Lantus 18 units daily Novolog 3 units three times a day with meal Check your sugars three times a day before meals. And then twice a week also check 2 hours after a meal.  Call me in 1 week to check your sugars  Follow up in 2 weeks.   For you knee pain, lets get an xray. Try Naproxen twice a day for 2-3 days then as needed. I made a referral to physical therapy.

## 2017-01-17 DIAGNOSIS — E119 Type 2 diabetes mellitus without complications: Secondary | ICD-10-CM | POA: Diagnosis not present

## 2017-01-18 ENCOUNTER — Encounter: Payer: Self-pay | Admitting: Internal Medicine

## 2017-01-18 DIAGNOSIS — E119 Type 2 diabetes mellitus without complications: Secondary | ICD-10-CM | POA: Diagnosis not present

## 2017-01-18 NOTE — Assessment & Plan Note (Signed)
Lantus 18 units daily Novolog 3 units three times a day with meal Check sugars three times a day before meals. And then twice a week also check 2 hours after a meal.  Patient to call me in 1 week to review sugars  Follow up in 2 weeks.

## 2017-01-19 DIAGNOSIS — E119 Type 2 diabetes mellitus without complications: Secondary | ICD-10-CM | POA: Diagnosis not present

## 2017-01-20 DIAGNOSIS — E119 Type 2 diabetes mellitus without complications: Secondary | ICD-10-CM | POA: Diagnosis not present

## 2017-01-21 DIAGNOSIS — E119 Type 2 diabetes mellitus without complications: Secondary | ICD-10-CM | POA: Diagnosis not present

## 2017-01-22 DIAGNOSIS — E119 Type 2 diabetes mellitus without complications: Secondary | ICD-10-CM | POA: Diagnosis not present

## 2017-01-23 DIAGNOSIS — E119 Type 2 diabetes mellitus without complications: Secondary | ICD-10-CM | POA: Diagnosis not present

## 2017-01-24 DIAGNOSIS — E119 Type 2 diabetes mellitus without complications: Secondary | ICD-10-CM | POA: Diagnosis not present

## 2017-01-25 DIAGNOSIS — E119 Type 2 diabetes mellitus without complications: Secondary | ICD-10-CM | POA: Diagnosis not present

## 2017-01-25 DIAGNOSIS — G4733 Obstructive sleep apnea (adult) (pediatric): Secondary | ICD-10-CM | POA: Diagnosis not present

## 2017-01-26 DIAGNOSIS — G4733 Obstructive sleep apnea (adult) (pediatric): Secondary | ICD-10-CM | POA: Diagnosis not present

## 2017-01-26 DIAGNOSIS — E119 Type 2 diabetes mellitus without complications: Secondary | ICD-10-CM | POA: Diagnosis not present

## 2017-01-27 ENCOUNTER — Ambulatory Visit
Admission: RE | Admit: 2017-01-27 | Discharge: 2017-01-27 | Disposition: A | Discharge status: Home / Self Care | Payer: Medicare HMO | Source: Ambulatory Visit | Attending: Family Medicine | Admitting: Family Medicine

## 2017-01-27 DIAGNOSIS — M179 Osteoarthritis of knee, unspecified: Secondary | ICD-10-CM | POA: Diagnosis not present

## 2017-01-27 DIAGNOSIS — M25562 Pain in left knee: Principal | ICD-10-CM

## 2017-01-27 DIAGNOSIS — G8929 Other chronic pain: Secondary | ICD-10-CM

## 2017-01-27 DIAGNOSIS — E119 Type 2 diabetes mellitus without complications: Secondary | ICD-10-CM | POA: Diagnosis not present

## 2017-01-28 DIAGNOSIS — E119 Type 2 diabetes mellitus without complications: Secondary | ICD-10-CM | POA: Diagnosis not present

## 2017-01-29 DIAGNOSIS — E119 Type 2 diabetes mellitus without complications: Secondary | ICD-10-CM | POA: Diagnosis not present

## 2017-01-30 DIAGNOSIS — E119 Type 2 diabetes mellitus without complications: Secondary | ICD-10-CM | POA: Diagnosis not present

## 2017-01-31 DIAGNOSIS — E119 Type 2 diabetes mellitus without complications: Secondary | ICD-10-CM | POA: Diagnosis not present

## 2017-01-31 NOTE — Progress Notes (Deleted)
   [facility] Phone: [phone]   Date of Visit: 02/01/2017   HPI:  ***  ROS: See HPI.  [place]:  PMH: HTN Pulmonary HTN DM2 with neuropathy and retinopathy HLD Varicose Veins  Chronic Venous Insufficiency OSA Knee OA Nephrolithiasis GERD Urge Incontinence Obesity FH of early heart disease  PHYSICAL EXAM: There were no vitals taken for this visit. Gen: *** HEENT: *** Heart: *** Lungs: *** Neuro: *** Ext: ***  ASSESSMENT/PLAN:  Health maintenance:  -***  No problem-specific Assessment & Plan notes found for this encounter.  FOLLOW UP: Follow up in *** for ***  Smiley Houseman, MD PGY [place]

## 2017-02-01 ENCOUNTER — Ambulatory Visit: Payer: Medicare HMO | Admitting: Internal Medicine

## 2017-02-01 ENCOUNTER — Telehealth: Payer: Self-pay | Admitting: Internal Medicine

## 2017-02-01 DIAGNOSIS — E119 Type 2 diabetes mellitus without complications: Secondary | ICD-10-CM | POA: Diagnosis not present

## 2017-02-01 NOTE — Telephone Encounter (Signed)
Attempted to call patient to discuss xray results. Went to voicemail, left message to call back.

## 2017-02-02 DIAGNOSIS — E119 Type 2 diabetes mellitus without complications: Secondary | ICD-10-CM | POA: Diagnosis not present

## 2017-02-03 DIAGNOSIS — E119 Type 2 diabetes mellitus without complications: Secondary | ICD-10-CM | POA: Diagnosis not present

## 2017-02-04 DIAGNOSIS — E119 Type 2 diabetes mellitus without complications: Secondary | ICD-10-CM | POA: Diagnosis not present

## 2017-02-05 DIAGNOSIS — E119 Type 2 diabetes mellitus without complications: Secondary | ICD-10-CM | POA: Diagnosis not present

## 2017-02-06 DIAGNOSIS — E119 Type 2 diabetes mellitus without complications: Secondary | ICD-10-CM | POA: Diagnosis not present

## 2017-02-06 DIAGNOSIS — G4733 Obstructive sleep apnea (adult) (pediatric): Secondary | ICD-10-CM | POA: Diagnosis not present

## 2017-02-07 DIAGNOSIS — E119 Type 2 diabetes mellitus without complications: Secondary | ICD-10-CM | POA: Diagnosis not present

## 2017-02-08 ENCOUNTER — Telehealth: Payer: Self-pay | Admitting: Internal Medicine

## 2017-02-08 DIAGNOSIS — E119 Type 2 diabetes mellitus without complications: Secondary | ICD-10-CM | POA: Diagnosis not present

## 2017-02-08 NOTE — Telephone Encounter (Signed)
Called to review cbgs. Went to [employer], left message to return call.

## 2017-02-09 DIAGNOSIS — E119 Type 2 diabetes mellitus without complications: Secondary | ICD-10-CM | POA: Diagnosis not present

## 2017-02-10 DIAGNOSIS — E119 Type 2 diabetes mellitus without complications: Secondary | ICD-10-CM | POA: Diagnosis not present

## 2017-02-11 DIAGNOSIS — E119 Type 2 diabetes mellitus without complications: Secondary | ICD-10-CM | POA: Diagnosis not present

## 2017-02-12 DIAGNOSIS — E119 Type 2 diabetes mellitus without complications: Secondary | ICD-10-CM | POA: Diagnosis not present

## 2017-02-13 DIAGNOSIS — E119 Type 2 diabetes mellitus without complications: Secondary | ICD-10-CM | POA: Diagnosis not present

## 2017-02-14 DIAGNOSIS — E119 Type 2 diabetes mellitus without complications: Secondary | ICD-10-CM | POA: Diagnosis not present

## 2017-02-15 DIAGNOSIS — E119 Type 2 diabetes mellitus without complications: Secondary | ICD-10-CM | POA: Diagnosis not present

## 2017-02-16 DIAGNOSIS — E119 Type 2 diabetes mellitus without complications: Secondary | ICD-10-CM | POA: Diagnosis not present

## 2017-02-17 DIAGNOSIS — E119 Type 2 diabetes mellitus without complications: Secondary | ICD-10-CM | POA: Diagnosis not present

## 2017-02-18 DIAGNOSIS — E119 Type 2 diabetes mellitus without complications: Secondary | ICD-10-CM | POA: Diagnosis not present

## 2017-02-19 DIAGNOSIS — E119 Type 2 diabetes mellitus without complications: Secondary | ICD-10-CM | POA: Diagnosis not present

## 2017-02-20 DIAGNOSIS — E119 Type 2 diabetes mellitus without complications: Secondary | ICD-10-CM | POA: Diagnosis not present

## 2017-02-20 NOTE — Progress Notes (Deleted)
   [facility] Phone: [phone]   Date of Visit: 02/22/2017   HPI:  ***  ROS: See HPI.  [place]:  PMH: HTN HLD Obesity  Pulmonary HTN, mild Chronic Venous Insufficiency  OSA GERD DM2 with neuropathy and retinopathy  Migraine Knee OA Recurrent Nephrolithiasis  Urge Incontinence  Family History of Early Heart Disease   PHYSICAL EXAM: There were no vitals taken for this visit. Gen: *** HEENT: *** Heart: *** Lungs: *** Neuro: *** Ext: ***  ASSESSMENT/PLAN:  Health maintenance:  -***  No problem-specific Assessment & Plan notes found for this encounter.  FOLLOW UP: Follow up in *** for ***  Smiley Houseman, MD PGY [place]

## 2017-02-21 DIAGNOSIS — E119 Type 2 diabetes mellitus without complications: Secondary | ICD-10-CM | POA: Diagnosis not present

## 2017-02-22 ENCOUNTER — Ambulatory Visit: Payer: Medicare HMO | Admitting: Internal Medicine

## 2017-02-22 DIAGNOSIS — E119 Type 2 diabetes mellitus without complications: Secondary | ICD-10-CM | POA: Diagnosis not present

## 2017-02-23 DIAGNOSIS — E119 Type 2 diabetes mellitus without complications: Secondary | ICD-10-CM | POA: Diagnosis not present

## 2017-02-24 DIAGNOSIS — E119 Type 2 diabetes mellitus without complications: Secondary | ICD-10-CM | POA: Diagnosis not present

## 2017-02-25 DIAGNOSIS — E119 Type 2 diabetes mellitus without complications: Secondary | ICD-10-CM | POA: Diagnosis not present

## 2017-02-25 NOTE — Progress Notes (Signed)
   [facility] Phone: [phone]   Date of Visit: 02/27/2017   HPI:  DM2: - hemoglobin A1c 9.9 in December 2018 - Medications: Metformin 1000mg  BID, Trulicity 1.5mg  weekly, Lantus 18units daily, Novolog 4 units TID (she increased her Novolog herself due to elevated cbg)  - no polyuria, polydipsia or polyphagia - cbgs: in the AM fasting (180, 156, 142, 124), 2hr PP Lunch (132-136, 151), 2hr PP Dinner (120-130, highest 151). No lows - reports that she does eat snacks at night - she also eats potatoes often - she drinks juice and sweet tea about 5 times a week - at the last visit she mentioned she was to start going to the gym. She has not started yet. Her obstacle: "nothing. I just feel lazy". Her daughter and grand-daughter have started going to the gym.  ROS: See HPI.  [place]:  PMH: Mild Pulmonary HTN HTN HLD Migraine Chronic Venous Insufficiency OSA DM2 with retinopathy and neuropathy Knee OA Recurrent Nephrolithiasis Urge Incontinence Obesity FH of early heart disease  PHYSICAL EXAM: BP 124/76   Pulse 84   Temp (!) 97.4 F (36.3 C) (Oral)   Wt 270 lb (122.5 kg)   SpO2 96%   BMI 41.05 kg/m  GEN: NAD CV: RRR, no murmurs, rubs, or gallops PULM: CTAB, normal effort SKIN: No rash or cyanosis; warm and well-perfused EXTR: No lower extremity edema or calf tenderness PSYCH: Mood and affect euthymic, normal rate and volume of speech NEURO: Awake, alert, no focal deficits grossly, normal speech Diabetic Foot Exam - Simple   Simple Foot Form Diabetic Foot exam was performed with the following findings:  Yes 02/27/2017  4:10 PM  Visual Inspection No deformities, no ulcerations, no other skin breakdown bilaterally:  Yes Sensation Testing Intact to touch and monofilament testing bilaterally:  Yes Pulse Check Posterior Tibialis and Dorsalis pulse intact bilaterally:  Yes Comments     ASSESSMENT/PLAN:  Health maintenance:  - reminded her  about ophthalmology exam   DM (diabetes mellitus), type 2 ([place]) Uncontrolled still. Asked her to increase Novolog to 5 units TID with meals for the next 3-4 days then increase to 6 units TID with meals if sugars are still above goal. Continue Lantus [id number] daily, Trulicity, and Metformin. Patient to call me in 4 weeks to discuss sugars. Follow up in clinic in early march. Patient made specific goals to improve her diet and physical activity:  Goal: decrease tea consumption to twice a week (from 5 times a week) Goal: go to the gym twice a week at least.    Smiley Houseman, MD PGY [place]

## 2017-02-26 DIAGNOSIS — E119 Type 2 diabetes mellitus without complications: Secondary | ICD-10-CM | POA: Diagnosis not present

## 2017-02-27 ENCOUNTER — Ambulatory Visit (INDEPENDENT_AMBULATORY_CARE_PROVIDER_SITE_OTHER): Payer: Medicare HMO | Admitting: Internal Medicine

## 2017-02-27 ENCOUNTER — Other Ambulatory Visit: Payer: Self-pay

## 2017-02-27 ENCOUNTER — Encounter: Payer: Self-pay | Admitting: Internal Medicine

## 2017-02-27 DIAGNOSIS — E1142 Type 2 diabetes mellitus with diabetic polyneuropathy: Secondary | ICD-10-CM

## 2017-02-27 DIAGNOSIS — E119 Type 2 diabetes mellitus without complications: Secondary | ICD-10-CM | POA: Diagnosis not present

## 2017-02-27 NOTE — Assessment & Plan Note (Addendum)
Uncontrolled still. Asked her to increase Novolog to 5 units TID with meals for the next 3-4 days then increase to 6 units TID with meals if sugars are still above goal. Continue Lantus [id number] daily, Trulicity, and Metformin. Patient to call me in 4 weeks to discuss sugars. Follow up in clinic in early march. Patient made specific goals to improve her diet and physical activity:  Goal: decrease tea consumption to twice a week (from 5 times a week) Goal: go to the gym twice a week at least.

## 2017-02-27 NOTE — Patient Instructions (Addendum)
Goal: decrease tea consumption to twice a week (from 5 times a week) Goal: go to the gym twice a week at least.  Follow up in March for your next A1c  CALL ME IN 4 WEEKS to discuss sugars   Let's go up to 5 units of Novolog. If your sugars are still higher than your goal, go up to 6 units.  Lantus 18 units  Sugar goal 90-130.

## 2017-02-28 ENCOUNTER — Encounter: Payer: Self-pay | Admitting: Internal Medicine

## 2017-02-28 DIAGNOSIS — E119 Type 2 diabetes mellitus without complications: Secondary | ICD-10-CM | POA: Diagnosis not present

## 2017-03-01 DIAGNOSIS — E119 Type 2 diabetes mellitus without complications: Secondary | ICD-10-CM | POA: Diagnosis not present

## 2017-03-02 DIAGNOSIS — E119 Type 2 diabetes mellitus without complications: Secondary | ICD-10-CM | POA: Diagnosis not present

## 2017-03-03 DIAGNOSIS — E119 Type 2 diabetes mellitus without complications: Secondary | ICD-10-CM | POA: Diagnosis not present

## 2017-03-04 DIAGNOSIS — E119 Type 2 diabetes mellitus without complications: Secondary | ICD-10-CM | POA: Diagnosis not present

## 2017-03-05 DIAGNOSIS — E119 Type 2 diabetes mellitus without complications: Secondary | ICD-10-CM | POA: Diagnosis not present

## 2017-03-06 DIAGNOSIS — E119 Type 2 diabetes mellitus without complications: Secondary | ICD-10-CM | POA: Diagnosis not present

## 2017-03-07 DIAGNOSIS — E119 Type 2 diabetes mellitus without complications: Secondary | ICD-10-CM | POA: Diagnosis not present

## 2017-03-08 DIAGNOSIS — E119 Type 2 diabetes mellitus without complications: Secondary | ICD-10-CM | POA: Diagnosis not present

## 2017-03-09 DIAGNOSIS — G4733 Obstructive sleep apnea (adult) (pediatric): Secondary | ICD-10-CM | POA: Diagnosis not present

## 2017-03-09 DIAGNOSIS — E119 Type 2 diabetes mellitus without complications: Secondary | ICD-10-CM | POA: Diagnosis not present

## 2017-03-10 ENCOUNTER — Telehealth: Payer: Self-pay | Admitting: Internal Medicine

## 2017-03-10 DIAGNOSIS — M1711 Unilateral primary osteoarthritis, right knee: Secondary | ICD-10-CM

## 2017-03-10 DIAGNOSIS — E119 Type 2 diabetes mellitus without complications: Secondary | ICD-10-CM | POA: Diagnosis not present

## 2017-03-10 NOTE — Telephone Encounter (Signed)
I have sent in a referral for orthpedics

## 2017-03-10 NOTE — Telephone Encounter (Signed)
Will forward to MD to advise. Willowdean Luhmann,CMA  

## 2017-03-10 NOTE — Telephone Encounter (Signed)
Pt would like a referral to an orthopedic. Her knee has gotten a lot worse since the last time she came to see Gunadasa Please advise

## 2017-03-11 DIAGNOSIS — E119 Type 2 diabetes mellitus without complications: Secondary | ICD-10-CM | POA: Diagnosis not present

## 2017-03-12 DIAGNOSIS — R1030 Lower abdominal pain, unspecified: Secondary | ICD-10-CM | POA: Diagnosis not present

## 2017-03-12 DIAGNOSIS — R1084 Generalized abdominal pain: Secondary | ICD-10-CM | POA: Diagnosis not present

## 2017-03-12 DIAGNOSIS — I10 Essential (primary) hypertension: Secondary | ICD-10-CM | POA: Diagnosis not present

## 2017-03-12 DIAGNOSIS — N2889 Other specified disorders of kidney and ureter: Secondary | ICD-10-CM | POA: Diagnosis not present

## 2017-03-12 DIAGNOSIS — M7989 Other specified soft tissue disorders: Secondary | ICD-10-CM | POA: Diagnosis not present

## 2017-03-12 DIAGNOSIS — Z888 Allergy status to other drugs, medicaments and biological substances status: Secondary | ICD-10-CM | POA: Diagnosis not present

## 2017-03-12 DIAGNOSIS — Z91013 Allergy to seafood: Secondary | ICD-10-CM | POA: Diagnosis not present

## 2017-03-12 DIAGNOSIS — M79605 Pain in left leg: Secondary | ICD-10-CM | POA: Diagnosis not present

## 2017-03-12 DIAGNOSIS — E119 Type 2 diabetes mellitus without complications: Secondary | ICD-10-CM | POA: Diagnosis not present

## 2017-03-12 DIAGNOSIS — K219 Gastro-esophageal reflux disease without esophagitis: Secondary | ICD-10-CM | POA: Diagnosis not present

## 2017-03-12 DIAGNOSIS — K5730 Diverticulosis of large intestine without perforation or abscess without bleeding: Secondary | ICD-10-CM | POA: Diagnosis not present

## 2017-03-12 DIAGNOSIS — Z886 Allergy status to analgesic agent status: Secondary | ICD-10-CM | POA: Diagnosis not present

## 2017-03-12 DIAGNOSIS — E785 Hyperlipidemia, unspecified: Secondary | ICD-10-CM | POA: Diagnosis not present

## 2017-03-12 DIAGNOSIS — M25562 Pain in left knee: Secondary | ICD-10-CM | POA: Diagnosis not present

## 2017-03-13 DIAGNOSIS — E119 Type 2 diabetes mellitus without complications: Secondary | ICD-10-CM | POA: Diagnosis not present

## 2017-03-13 DIAGNOSIS — M25552 Pain in left hip: Secondary | ICD-10-CM | POA: Diagnosis not present

## 2017-03-13 DIAGNOSIS — Z794 Long term (current) use of insulin: Secondary | ICD-10-CM | POA: Diagnosis not present

## 2017-03-13 DIAGNOSIS — M1712 Unilateral primary osteoarthritis, left knee: Secondary | ICD-10-CM | POA: Diagnosis not present

## 2017-03-14 DIAGNOSIS — E119 Type 2 diabetes mellitus without complications: Secondary | ICD-10-CM | POA: Diagnosis not present

## 2017-03-15 DIAGNOSIS — E119 Type 2 diabetes mellitus without complications: Secondary | ICD-10-CM | POA: Diagnosis not present

## 2017-03-16 DIAGNOSIS — E119 Type 2 diabetes mellitus without complications: Secondary | ICD-10-CM | POA: Diagnosis not present

## 2017-03-17 DIAGNOSIS — E119 Type 2 diabetes mellitus without complications: Secondary | ICD-10-CM | POA: Diagnosis not present

## 2017-03-18 DIAGNOSIS — E119 Type 2 diabetes mellitus without complications: Secondary | ICD-10-CM | POA: Diagnosis not present

## 2017-03-19 DIAGNOSIS — E119 Type 2 diabetes mellitus without complications: Secondary | ICD-10-CM | POA: Diagnosis not present

## 2017-03-20 DIAGNOSIS — E119 Type 2 diabetes mellitus without complications: Secondary | ICD-10-CM | POA: Diagnosis not present

## 2017-03-21 DIAGNOSIS — E119 Type 2 diabetes mellitus without complications: Secondary | ICD-10-CM | POA: Diagnosis not present

## 2017-03-22 DIAGNOSIS — E119 Type 2 diabetes mellitus without complications: Secondary | ICD-10-CM | POA: Diagnosis not present

## 2017-03-23 ENCOUNTER — Other Ambulatory Visit: Payer: Self-pay | Admitting: Internal Medicine

## 2017-03-23 DIAGNOSIS — E118 Type 2 diabetes mellitus with unspecified complications: Secondary | ICD-10-CM | POA: Diagnosis not present

## 2017-03-23 DIAGNOSIS — E119 Type 2 diabetes mellitus without complications: Secondary | ICD-10-CM | POA: Diagnosis not present

## 2017-03-23 DIAGNOSIS — N200 Calculus of kidney: Secondary | ICD-10-CM | POA: Diagnosis not present

## 2017-03-23 DIAGNOSIS — N39498 Other specified urinary incontinence: Secondary | ICD-10-CM | POA: Diagnosis not present

## 2017-03-23 DIAGNOSIS — I10 Essential (primary) hypertension: Secondary | ICD-10-CM | POA: Diagnosis not present

## 2017-03-23 DIAGNOSIS — N3946 Mixed incontinence: Secondary | ICD-10-CM | POA: Diagnosis not present

## 2017-03-23 DIAGNOSIS — Z794 Long term (current) use of insulin: Principal | ICD-10-CM

## 2017-03-23 DIAGNOSIS — E1142 Type 2 diabetes mellitus with diabetic polyneuropathy: Secondary | ICD-10-CM

## 2017-03-24 DIAGNOSIS — E119 Type 2 diabetes mellitus without complications: Secondary | ICD-10-CM | POA: Diagnosis not present

## 2017-03-25 DIAGNOSIS — E119 Type 2 diabetes mellitus without complications: Secondary | ICD-10-CM | POA: Diagnosis not present

## 2017-03-26 DIAGNOSIS — E119 Type 2 diabetes mellitus without complications: Secondary | ICD-10-CM | POA: Diagnosis not present

## 2017-03-27 ENCOUNTER — Other Ambulatory Visit: Payer: Self-pay | Admitting: Internal Medicine

## 2017-03-27 DIAGNOSIS — E119 Type 2 diabetes mellitus without complications: Secondary | ICD-10-CM | POA: Diagnosis not present

## 2017-03-27 DIAGNOSIS — I10 Essential (primary) hypertension: Secondary | ICD-10-CM

## 2017-03-28 DIAGNOSIS — E119 Type 2 diabetes mellitus without complications: Secondary | ICD-10-CM | POA: Diagnosis not present

## 2017-03-29 DIAGNOSIS — E119 Type 2 diabetes mellitus without complications: Secondary | ICD-10-CM | POA: Diagnosis not present

## 2017-03-30 DIAGNOSIS — E119 Type 2 diabetes mellitus without complications: Secondary | ICD-10-CM | POA: Diagnosis not present

## 2017-03-31 DIAGNOSIS — E119 Type 2 diabetes mellitus without complications: Secondary | ICD-10-CM | POA: Diagnosis not present

## 2017-04-01 DIAGNOSIS — E119 Type 2 diabetes mellitus without complications: Secondary | ICD-10-CM | POA: Diagnosis not present

## 2017-04-02 DIAGNOSIS — E119 Type 2 diabetes mellitus without complications: Secondary | ICD-10-CM | POA: Diagnosis not present

## 2017-04-03 DIAGNOSIS — E119 Type 2 diabetes mellitus without complications: Secondary | ICD-10-CM | POA: Diagnosis not present

## 2017-04-04 DIAGNOSIS — E119 Type 2 diabetes mellitus without complications: Secondary | ICD-10-CM | POA: Diagnosis not present

## 2017-04-05 DIAGNOSIS — E119 Type 2 diabetes mellitus without complications: Secondary | ICD-10-CM | POA: Diagnosis not present

## 2017-04-06 DIAGNOSIS — G4733 Obstructive sleep apnea (adult) (pediatric): Secondary | ICD-10-CM | POA: Diagnosis not present

## 2017-04-06 DIAGNOSIS — E119 Type 2 diabetes mellitus without complications: Secondary | ICD-10-CM | POA: Diagnosis not present

## 2017-04-07 DIAGNOSIS — E119 Type 2 diabetes mellitus without complications: Secondary | ICD-10-CM | POA: Diagnosis not present

## 2017-04-08 DIAGNOSIS — E119 Type 2 diabetes mellitus without complications: Secondary | ICD-10-CM | POA: Diagnosis not present

## 2017-04-09 DIAGNOSIS — E119 Type 2 diabetes mellitus without complications: Secondary | ICD-10-CM | POA: Diagnosis not present

## 2017-04-10 ENCOUNTER — Other Ambulatory Visit: Payer: Self-pay | Admitting: Internal Medicine

## 2017-04-10 DIAGNOSIS — E119 Type 2 diabetes mellitus without complications: Secondary | ICD-10-CM | POA: Diagnosis not present

## 2017-04-10 DIAGNOSIS — M1712 Unilateral primary osteoarthritis, left knee: Secondary | ICD-10-CM | POA: Diagnosis not present

## 2017-04-10 DIAGNOSIS — I10 Essential (primary) hypertension: Secondary | ICD-10-CM | POA: Diagnosis not present

## 2017-04-10 DIAGNOSIS — M25562 Pain in left knee: Secondary | ICD-10-CM | POA: Diagnosis not present

## 2017-04-11 DIAGNOSIS — E119 Type 2 diabetes mellitus without complications: Secondary | ICD-10-CM | POA: Diagnosis not present

## 2017-04-12 DIAGNOSIS — E119 Type 2 diabetes mellitus without complications: Secondary | ICD-10-CM | POA: Diagnosis not present

## 2017-04-13 DIAGNOSIS — E119 Type 2 diabetes mellitus without complications: Secondary | ICD-10-CM | POA: Diagnosis not present

## 2017-04-14 DIAGNOSIS — E119 Type 2 diabetes mellitus without complications: Secondary | ICD-10-CM | POA: Diagnosis not present

## 2017-04-15 DIAGNOSIS — E119 Type 2 diabetes mellitus without complications: Secondary | ICD-10-CM | POA: Diagnosis not present

## 2017-04-16 DIAGNOSIS — E119 Type 2 diabetes mellitus without complications: Secondary | ICD-10-CM | POA: Diagnosis not present

## 2017-04-17 DIAGNOSIS — E119 Type 2 diabetes mellitus without complications: Secondary | ICD-10-CM | POA: Diagnosis not present

## 2017-04-18 ENCOUNTER — Encounter: Payer: Self-pay | Admitting: *Deleted

## 2017-04-18 DIAGNOSIS — E119 Type 2 diabetes mellitus without complications: Secondary | ICD-10-CM | POA: Diagnosis not present

## 2017-04-19 DIAGNOSIS — E119 Type 2 diabetes mellitus without complications: Secondary | ICD-10-CM | POA: Diagnosis not present

## 2017-04-20 DIAGNOSIS — E119 Type 2 diabetes mellitus without complications: Secondary | ICD-10-CM | POA: Diagnosis not present

## 2017-04-21 DIAGNOSIS — E119 Type 2 diabetes mellitus without complications: Secondary | ICD-10-CM | POA: Diagnosis not present

## 2017-04-22 DIAGNOSIS — E119 Type 2 diabetes mellitus without complications: Secondary | ICD-10-CM | POA: Diagnosis not present

## 2017-04-23 DIAGNOSIS — E119 Type 2 diabetes mellitus without complications: Secondary | ICD-10-CM | POA: Diagnosis not present

## 2017-04-24 DIAGNOSIS — E119 Type 2 diabetes mellitus without complications: Secondary | ICD-10-CM | POA: Diagnosis not present

## 2017-04-25 DIAGNOSIS — E119 Type 2 diabetes mellitus without complications: Secondary | ICD-10-CM | POA: Diagnosis not present

## 2017-04-26 DIAGNOSIS — E119 Type 2 diabetes mellitus without complications: Secondary | ICD-10-CM | POA: Diagnosis not present

## 2017-04-27 DIAGNOSIS — E119 Type 2 diabetes mellitus without complications: Secondary | ICD-10-CM | POA: Diagnosis not present

## 2017-04-28 DIAGNOSIS — E119 Type 2 diabetes mellitus without complications: Secondary | ICD-10-CM | POA: Diagnosis not present

## 2017-04-29 DIAGNOSIS — E119 Type 2 diabetes mellitus without complications: Secondary | ICD-10-CM | POA: Diagnosis not present

## 2017-04-30 DIAGNOSIS — E119 Type 2 diabetes mellitus without complications: Secondary | ICD-10-CM | POA: Diagnosis not present

## 2017-05-01 DIAGNOSIS — E119 Type 2 diabetes mellitus without complications: Secondary | ICD-10-CM | POA: Diagnosis not present

## 2017-05-02 DIAGNOSIS — E119 Type 2 diabetes mellitus without complications: Secondary | ICD-10-CM | POA: Diagnosis not present

## 2017-05-03 DIAGNOSIS — E119 Type 2 diabetes mellitus without complications: Secondary | ICD-10-CM | POA: Diagnosis not present

## 2017-05-03 DIAGNOSIS — M2352 Chronic instability of knee, left knee: Secondary | ICD-10-CM | POA: Diagnosis not present

## 2017-05-03 DIAGNOSIS — M1712 Unilateral primary osteoarthritis, left knee: Secondary | ICD-10-CM | POA: Diagnosis not present

## 2017-05-04 DIAGNOSIS — E119 Type 2 diabetes mellitus without complications: Secondary | ICD-10-CM | POA: Diagnosis not present

## 2017-05-05 DIAGNOSIS — E119 Type 2 diabetes mellitus without complications: Secondary | ICD-10-CM | POA: Diagnosis not present

## 2017-05-06 DIAGNOSIS — E119 Type 2 diabetes mellitus without complications: Secondary | ICD-10-CM | POA: Diagnosis not present

## 2017-05-07 DIAGNOSIS — E119 Type 2 diabetes mellitus without complications: Secondary | ICD-10-CM | POA: Diagnosis not present

## 2017-05-08 DIAGNOSIS — E119 Type 2 diabetes mellitus without complications: Secondary | ICD-10-CM | POA: Diagnosis not present

## 2017-05-09 DIAGNOSIS — E119 Type 2 diabetes mellitus without complications: Secondary | ICD-10-CM | POA: Diagnosis not present

## 2017-05-10 DIAGNOSIS — E119 Type 2 diabetes mellitus without complications: Secondary | ICD-10-CM | POA: Diagnosis not present

## 2017-05-11 DIAGNOSIS — G4733 Obstructive sleep apnea (adult) (pediatric): Secondary | ICD-10-CM | POA: Diagnosis not present

## 2017-05-11 DIAGNOSIS — E119 Type 2 diabetes mellitus without complications: Secondary | ICD-10-CM | POA: Diagnosis not present

## 2017-05-12 DIAGNOSIS — E119 Type 2 diabetes mellitus without complications: Secondary | ICD-10-CM | POA: Diagnosis not present

## 2017-05-13 DIAGNOSIS — E119 Type 2 diabetes mellitus without complications: Secondary | ICD-10-CM | POA: Diagnosis not present

## 2017-05-14 DIAGNOSIS — E119 Type 2 diabetes mellitus without complications: Secondary | ICD-10-CM | POA: Diagnosis not present

## 2017-05-15 DIAGNOSIS — E119 Type 2 diabetes mellitus without complications: Secondary | ICD-10-CM | POA: Diagnosis not present

## 2017-05-16 DIAGNOSIS — E119 Type 2 diabetes mellitus without complications: Secondary | ICD-10-CM | POA: Diagnosis not present

## 2017-05-17 DIAGNOSIS — E119 Type 2 diabetes mellitus without complications: Secondary | ICD-10-CM | POA: Diagnosis not present

## 2017-05-18 DIAGNOSIS — E119 Type 2 diabetes mellitus without complications: Secondary | ICD-10-CM | POA: Diagnosis not present

## 2017-05-19 DIAGNOSIS — E119 Type 2 diabetes mellitus without complications: Secondary | ICD-10-CM | POA: Diagnosis not present

## 2017-05-20 DIAGNOSIS — E119 Type 2 diabetes mellitus without complications: Secondary | ICD-10-CM | POA: Diagnosis not present

## 2017-05-21 DIAGNOSIS — E119 Type 2 diabetes mellitus without complications: Secondary | ICD-10-CM | POA: Diagnosis not present

## 2017-05-22 DIAGNOSIS — E119 Type 2 diabetes mellitus without complications: Secondary | ICD-10-CM | POA: Diagnosis not present

## 2017-05-23 DIAGNOSIS — E119 Type 2 diabetes mellitus without complications: Secondary | ICD-10-CM | POA: Diagnosis not present

## 2017-05-24 DIAGNOSIS — E119 Type 2 diabetes mellitus without complications: Secondary | ICD-10-CM | POA: Diagnosis not present

## 2017-05-25 DIAGNOSIS — E119 Type 2 diabetes mellitus without complications: Secondary | ICD-10-CM | POA: Diagnosis not present

## 2017-05-26 DIAGNOSIS — E119 Type 2 diabetes mellitus without complications: Secondary | ICD-10-CM | POA: Diagnosis not present

## 2017-05-27 DIAGNOSIS — E119 Type 2 diabetes mellitus without complications: Secondary | ICD-10-CM | POA: Diagnosis not present

## 2017-05-28 DIAGNOSIS — E119 Type 2 diabetes mellitus without complications: Secondary | ICD-10-CM | POA: Diagnosis not present

## 2017-05-29 DIAGNOSIS — E119 Type 2 diabetes mellitus without complications: Secondary | ICD-10-CM | POA: Diagnosis not present

## 2017-05-30 DIAGNOSIS — E119 Type 2 diabetes mellitus without complications: Secondary | ICD-10-CM | POA: Diagnosis not present

## 2017-05-31 DIAGNOSIS — E119 Type 2 diabetes mellitus without complications: Secondary | ICD-10-CM | POA: Diagnosis not present

## 2017-06-01 DIAGNOSIS — E119 Type 2 diabetes mellitus without complications: Secondary | ICD-10-CM | POA: Diagnosis not present

## 2017-06-02 DIAGNOSIS — E119 Type 2 diabetes mellitus without complications: Secondary | ICD-10-CM | POA: Diagnosis not present

## 2017-06-03 DIAGNOSIS — E119 Type 2 diabetes mellitus without complications: Secondary | ICD-10-CM | POA: Diagnosis not present

## 2017-06-04 DIAGNOSIS — E119 Type 2 diabetes mellitus without complications: Secondary | ICD-10-CM | POA: Diagnosis not present

## 2017-06-05 DIAGNOSIS — E119 Type 2 diabetes mellitus without complications: Secondary | ICD-10-CM | POA: Diagnosis not present

## 2017-06-06 DIAGNOSIS — E119 Type 2 diabetes mellitus without complications: Secondary | ICD-10-CM | POA: Diagnosis not present

## 2017-06-07 DIAGNOSIS — E119 Type 2 diabetes mellitus without complications: Secondary | ICD-10-CM | POA: Diagnosis not present

## 2017-06-08 DIAGNOSIS — E119 Type 2 diabetes mellitus without complications: Secondary | ICD-10-CM | POA: Diagnosis not present

## 2017-06-09 DIAGNOSIS — E119 Type 2 diabetes mellitus without complications: Secondary | ICD-10-CM | POA: Diagnosis not present

## 2017-06-10 DIAGNOSIS — E119 Type 2 diabetes mellitus without complications: Secondary | ICD-10-CM | POA: Diagnosis not present

## 2017-06-11 DIAGNOSIS — E119 Type 2 diabetes mellitus without complications: Secondary | ICD-10-CM | POA: Diagnosis not present

## 2017-06-12 DIAGNOSIS — E119 Type 2 diabetes mellitus without complications: Secondary | ICD-10-CM | POA: Diagnosis not present

## 2017-06-13 ENCOUNTER — Other Ambulatory Visit: Payer: Self-pay | Admitting: Family Medicine

## 2017-06-13 DIAGNOSIS — E119 Type 2 diabetes mellitus without complications: Secondary | ICD-10-CM | POA: Diagnosis not present

## 2017-06-13 DIAGNOSIS — Z1231 Encounter for screening mammogram for malignant neoplasm of breast: Secondary | ICD-10-CM

## 2017-06-14 DIAGNOSIS — E119 Type 2 diabetes mellitus without complications: Secondary | ICD-10-CM | POA: Diagnosis not present

## 2017-06-15 DIAGNOSIS — E119 Type 2 diabetes mellitus without complications: Secondary | ICD-10-CM | POA: Diagnosis not present

## 2017-06-16 DIAGNOSIS — E119 Type 2 diabetes mellitus without complications: Secondary | ICD-10-CM | POA: Diagnosis not present

## 2017-06-17 DIAGNOSIS — E119 Type 2 diabetes mellitus without complications: Secondary | ICD-10-CM | POA: Diagnosis not present

## 2017-06-18 DIAGNOSIS — E119 Type 2 diabetes mellitus without complications: Secondary | ICD-10-CM | POA: Diagnosis not present

## 2017-06-19 DIAGNOSIS — E119 Type 2 diabetes mellitus without complications: Secondary | ICD-10-CM | POA: Diagnosis not present

## 2017-06-20 DIAGNOSIS — R32 Unspecified urinary incontinence: Secondary | ICD-10-CM | POA: Diagnosis not present

## 2017-06-20 DIAGNOSIS — N200 Calculus of kidney: Secondary | ICD-10-CM | POA: Diagnosis not present

## 2017-06-20 DIAGNOSIS — E119 Type 2 diabetes mellitus without complications: Secondary | ICD-10-CM | POA: Diagnosis not present

## 2017-06-21 DIAGNOSIS — E119 Type 2 diabetes mellitus without complications: Secondary | ICD-10-CM | POA: Diagnosis not present

## 2017-06-22 DIAGNOSIS — E119 Type 2 diabetes mellitus without complications: Secondary | ICD-10-CM | POA: Diagnosis not present

## 2017-06-23 DIAGNOSIS — E119 Type 2 diabetes mellitus without complications: Secondary | ICD-10-CM | POA: Diagnosis not present

## 2017-06-24 DIAGNOSIS — E119 Type 2 diabetes mellitus without complications: Secondary | ICD-10-CM | POA: Diagnosis not present

## 2017-06-25 DIAGNOSIS — E119 Type 2 diabetes mellitus without complications: Secondary | ICD-10-CM | POA: Diagnosis not present

## 2017-06-26 DIAGNOSIS — E119 Type 2 diabetes mellitus without complications: Secondary | ICD-10-CM | POA: Diagnosis not present

## 2017-06-27 DIAGNOSIS — E119 Type 2 diabetes mellitus without complications: Secondary | ICD-10-CM | POA: Diagnosis not present

## 2017-06-27 NOTE — Progress Notes (Signed)
[facility] Phone: [phone]   Date of Visit: 06/28/2017   HPI:  Patient presents today for a well woman exam.   Concerns today: None Type 2 diabetes: -Medications: Lantus 20 units daily, NovoLog 3 units 3 times daily with meals, Trulicity 1.5 mg weekly metformin 1000 mg twice daily -She brings her glucometer in today.  She reports her sugars have been high recently due to her dietary indiscretion.  She has been eating out more recently as she her husband have moved into a new home and have not set up the kitchen yet.  Her CBGs are around mid to upper 100s and at times in the low 200s.  She has only been checking her sugars in the morning. Periods: Hysterectomy Contraception: Hysterectomy Pelvic symptoms: Denies any vaginal bleeding, vaginal discharge or pelvic pain Sexual activity: With her husband STD Screening: Declines Exercise: Does not exercise regularly Diet: As noted above she has not been following a diabetic diet recently Smoking: Never smoker Alcohol: Denies Drugs: Denies Mood: PHQ 2 negative   ROS: Review of Systems  Constitutional: Negative for chills, fever, malaise/fatigue and weight loss.  HENT: Positive for hearing loss (Reports that her family member states that she has decreased hearing). Negative for congestion, ear pain, sore throat and tinnitus.   Eyes: Negative for blurred vision and double vision.  Respiratory: Negative for cough, hemoptysis, sputum production and shortness of breath.   Cardiovascular: Positive for leg swelling (Chronic bilateral leg swelling which  has improved). Negative for chest pain, palpitations and orthopnea.  Gastrointestinal: Negative for abdominal pain, blood in stool, constipation, diarrhea, heartburn, melena, nausea and vomiting.  Genitourinary: Negative for dysuria, frequency and urgency.  Musculoskeletal: Negative for back pain, myalgias and neck pain.  Skin: Negative for itching and rash.    Neurological: Negative for dizziness, sensory change, focal weakness, weakness and headaches.  Psychiatric/Behavioral: Negative for depression, substance abuse and suicidal ideas.     [place]:  Family history updated.   PMH: Pulmonary HTN, Mild HTN Chronic Venous Insufficiency OSA GERD DM2 with retinopathy and neuropathy  HLD FH early ht disease  PHYSICAL EXAM: BP 118/70   Pulse 81   Temp 97.7 F (36.5 C) (Oral)   Ht 5\' 8"  (1.727 m)   Wt 265 lb (120.2 kg)   SpO2 96%   BMI 40.29 kg/m  Gen: NAD, pleasant, cooperative HEENT: NCAT, PERRL, no palpable thyromegaly or anterior cervical lymphadenopathy Heart: RRR, systolic murmur Lungs: CTAB, NWOB Abdomen: soft, nontender to palpation Neuro: grossly nonfocal, speech normal  lower extremities: Minimal to trace pitting edema in the lower extremities bilaterally.  No calf tenderness bilaterally  ASSESSMENT/PLAN:  # Health maintenance:  -lipid screening: Future order placed to come in fasting -immunizations: Shingrix resent to pharmacy per patient request  DM (diabetes mellitus), type 2 ([place]) A1c is 8.5 from 9.9.  I think she would be a good candidate to start an SGLT2 inhibitor.  Her creatinine is normal.  However, patient and her husband are hesitant to start this as a red something regarding flesh eating bacteria as a side effect of SGLT2 inhibitors.  After discussion with Dr. Valentina Lucks it appears that this is a possible adverse effect, although the risks are low.  Will discuss with patient via phone tomorrow as I believe that the benefits outweigh the risks of this medication for her.  If she is not interested in this medication, we will likely increase her insulin doses.  I recommended that she check her  CBGs 3 times a day and keep a record of this.  HTN (hypertension) Very well controlled.  After discussion, we decided to decrease her Norvasc from 10 mg to 5 mg daily.  This may also help with her lower extremity swelling.    Chronic venous insufficiency Encouraged her to wear compression stockings regularly.  She reports that symptoms have improved significantly.  We will also try to decrease and hopefully come off of Norvasc which might help with her symptoms as well.    Smiley Houseman, MD PGY [place]

## 2017-06-28 ENCOUNTER — Other Ambulatory Visit: Payer: Self-pay

## 2017-06-28 ENCOUNTER — Ambulatory Visit (INDEPENDENT_AMBULATORY_CARE_PROVIDER_SITE_OTHER): Payer: Medicare HMO | Admitting: Internal Medicine

## 2017-06-28 ENCOUNTER — Encounter: Payer: Self-pay | Admitting: Internal Medicine

## 2017-06-28 VITALS — BP 118/70 | HR 81 | Temp 97.7°F | Ht 68.0 in | Wt 265.0 lb

## 2017-06-28 DIAGNOSIS — I10 Essential (primary) hypertension: Secondary | ICD-10-CM

## 2017-06-28 DIAGNOSIS — E785 Hyperlipidemia, unspecified: Secondary | ICD-10-CM | POA: Diagnosis not present

## 2017-06-28 DIAGNOSIS — I872 Venous insufficiency (chronic) (peripheral): Secondary | ICD-10-CM

## 2017-06-28 DIAGNOSIS — E1165 Type 2 diabetes mellitus with hyperglycemia: Secondary | ICD-10-CM

## 2017-06-28 DIAGNOSIS — Z0000 Encounter for general adult medical examination without abnormal findings: Secondary | ICD-10-CM | POA: Diagnosis not present

## 2017-06-28 DIAGNOSIS — E119 Type 2 diabetes mellitus without complications: Secondary | ICD-10-CM | POA: Diagnosis not present

## 2017-06-28 DIAGNOSIS — E1142 Type 2 diabetes mellitus with diabetic polyneuropathy: Secondary | ICD-10-CM

## 2017-06-28 LAB — POCT GLYCOSYLATED HEMOGLOBIN (HGB A1C): HBA1C, POC (CONTROLLED DIABETIC RANGE): 8.5 % — AB (ref 0.0–7.0)

## 2017-06-28 MED ORDER — ZOSTER VAC RECOMB ADJUVANTED 50 MCG/0.5ML IM SUSR: 0.5000 mL | Freq: Once | INTRAMUSCULAR | 1 refills | Status: AC

## 2017-06-28 MED ORDER — INSULIN GLARGINE 100 UNIT/ML SOLOSTAR PEN: 20.0000 [IU] | PEN_INJECTOR | Freq: Every day | SUBCUTANEOUS | 6 refills | Status: AC

## 2017-06-28 NOTE — Patient Instructions (Addendum)
1) check your sugars three times a day and keep a record of this 2) lets cut down on your norvasc from 10mg  to 5 mg daily. (cut the 10mg  tab in half)  3) let me get in touch with Dr. Valentina Lucks about the new medication for diabetes. I will call you after  4) I sent the shingles vaccine to the pharamcy  5) please make a lab visit and come in fasting for your cholesterol level

## 2017-06-28 NOTE — Assessment & Plan Note (Signed)
Very well controlled.  After discussion, we decided to decrease her Norvasc from 10 mg to 5 mg daily.  This may also help with her lower extremity swelling.

## 2017-06-28 NOTE — Assessment & Plan Note (Signed)
A1c is 8.5 from 9.9.  I think she would be a good candidate to start an SGLT2 inhibitor.  Her creatinine is normal.  However, patient and her husband are hesitant to start this as a red something regarding flesh eating bacteria as a side effect of SGLT2 inhibitors.  After discussion with Dr. Valentina Lucks it appears that this is a possible adverse effect, although the risks are low.  Will discuss with patient via phone tomorrow as I believe that the benefits outweigh the risks of this medication for her.  If she is not interested in this medication, we will likely increase her insulin doses.  I recommended that she check her CBGs 3 times a day and keep a record of this.

## 2017-06-28 NOTE — Assessment & Plan Note (Signed)
Encouraged her to wear compression stockings regularly.  She reports that symptoms have improved significantly.  We will also try to decrease and hopefully come off of Norvasc which might help with her symptoms as well.

## 2017-06-29 DIAGNOSIS — E119 Type 2 diabetes mellitus without complications: Secondary | ICD-10-CM | POA: Diagnosis not present

## 2017-06-29 LAB — BASIC METABOLIC PANEL
BUN/Creatinine Ratio: 16 (ref 9–23)
BUN: 14 mg/dL (ref 6–24)
CALCIUM: 9.1 mg/dL (ref 8.7–10.2)
CHLORIDE: 106 mmol/L (ref 96–106)
CO2: 22 mmol/L (ref 20–29)
CREATININE: 0.86 mg/dL (ref 0.57–1.00)
GFR calc non Af Amer: 74 mL/min/{1.73_m2} (ref >59)
GFR, EST AFRICAN AMERICAN: 86 mL/min/{1.73_m2} (ref >59)
GLUCOSE: 218 mg/dL — AB (ref 65–99)
Potassium: 3.7 mmol/L (ref 3.5–5.2)
Sodium: 144 mmol/L (ref 134–144)

## 2017-06-30 ENCOUNTER — Telehealth: Payer: Self-pay | Admitting: Internal Medicine

## 2017-06-30 DIAGNOSIS — E119 Type 2 diabetes mellitus without complications: Secondary | ICD-10-CM | POA: Diagnosis not present

## 2017-06-30 DIAGNOSIS — E1142 Type 2 diabetes mellitus with diabetic polyneuropathy: Secondary | ICD-10-CM

## 2017-06-30 MED ORDER — EMPAGLIFLOZIN 10 MG PO TABS: 10.0000 mg | ORAL_TABLET | Freq: Every day | ORAL | 0 refills | Status: DC

## 2017-06-30 NOTE — Telephone Encounter (Signed)
Called patient and discussed the risks and benefits of Jardiance. Patient is willing to start [place]. Jardiance 10mg  daily sent. Appointment made on June 17th. Discussed calling if she has any questions and concerns.

## 2017-06-30 NOTE — Assessment & Plan Note (Signed)
After discussing with patient, start Jardiance 10mg  daily. Follow up on 6/17th

## 2017-06-30 NOTE — Telephone Encounter (Deleted)
-----   Message from Smiley Houseman, MD sent at 06/29/2017  8:18 AM EDT ----- Call about SGLT2

## 2017-07-01 DIAGNOSIS — E119 Type 2 diabetes mellitus without complications: Secondary | ICD-10-CM | POA: Diagnosis not present

## 2017-07-02 DIAGNOSIS — E119 Type 2 diabetes mellitus without complications: Secondary | ICD-10-CM | POA: Diagnosis not present

## 2017-07-03 DIAGNOSIS — E119 Type 2 diabetes mellitus without complications: Secondary | ICD-10-CM | POA: Diagnosis not present

## 2017-07-04 DIAGNOSIS — E119 Type 2 diabetes mellitus without complications: Secondary | ICD-10-CM | POA: Diagnosis not present

## 2017-07-05 DIAGNOSIS — E119 Type 2 diabetes mellitus without complications: Secondary | ICD-10-CM | POA: Diagnosis not present

## 2017-07-06 DIAGNOSIS — E119 Type 2 diabetes mellitus without complications: Secondary | ICD-10-CM | POA: Diagnosis not present

## 2017-07-07 DIAGNOSIS — E119 Type 2 diabetes mellitus without complications: Secondary | ICD-10-CM | POA: Diagnosis not present

## 2017-07-08 DIAGNOSIS — E119 Type 2 diabetes mellitus without complications: Secondary | ICD-10-CM | POA: Diagnosis not present

## 2017-07-09 DIAGNOSIS — E119 Type 2 diabetes mellitus without complications: Secondary | ICD-10-CM | POA: Diagnosis not present

## 2017-07-10 DIAGNOSIS — E119 Type 2 diabetes mellitus without complications: Secondary | ICD-10-CM | POA: Diagnosis not present

## 2017-07-11 DIAGNOSIS — E119 Type 2 diabetes mellitus without complications: Secondary | ICD-10-CM | POA: Diagnosis not present

## 2017-07-12 ENCOUNTER — Other Ambulatory Visit: Payer: Medicare HMO

## 2017-07-12 ENCOUNTER — Ambulatory Visit (INDEPENDENT_AMBULATORY_CARE_PROVIDER_SITE_OTHER): Payer: Medicare HMO | Admitting: Obstetrics

## 2017-07-12 ENCOUNTER — Encounter: Payer: Self-pay | Admitting: Obstetrics

## 2017-07-12 ENCOUNTER — Other Ambulatory Visit (HOSPITAL_COMMUNITY)
Admission: RE | Admit: 2017-07-12 | Discharge: 2017-07-12 | Disposition: A | Discharge status: Home / Self Care | Payer: Medicare HMO | Source: Ambulatory Visit | Attending: Obstetrics | Admitting: Obstetrics

## 2017-07-12 VITALS — BP 139/93 | HR 74 | Wt 265.7 lb

## 2017-07-12 DIAGNOSIS — E785 Hyperlipidemia, unspecified: Secondary | ICD-10-CM

## 2017-07-12 DIAGNOSIS — H2513 Age-related nuclear cataract, bilateral: Secondary | ICD-10-CM | POA: Diagnosis not present

## 2017-07-12 DIAGNOSIS — E139 Other specified diabetes mellitus without complications: Secondary | ICD-10-CM

## 2017-07-12 DIAGNOSIS — H01022 Squamous blepharitis right lower eyelid: Secondary | ICD-10-CM | POA: Diagnosis not present

## 2017-07-12 DIAGNOSIS — H01021 Squamous blepharitis right upper eyelid: Secondary | ICD-10-CM | POA: Diagnosis not present

## 2017-07-12 DIAGNOSIS — E113293 Type 2 diabetes mellitus with mild nonproliferative diabetic retinopathy without macular edema, bilateral: Secondary | ICD-10-CM | POA: Diagnosis not present

## 2017-07-12 DIAGNOSIS — H01025 Squamous blepharitis left lower eyelid: Secondary | ICD-10-CM | POA: Diagnosis not present

## 2017-07-12 DIAGNOSIS — N898 Other specified noninflammatory disorders of vagina: Secondary | ICD-10-CM | POA: Insufficient documentation

## 2017-07-12 DIAGNOSIS — H01024 Squamous blepharitis left upper eyelid: Secondary | ICD-10-CM | POA: Diagnosis not present

## 2017-07-12 DIAGNOSIS — Z01419 Encounter for gynecological examination (general) (routine) without abnormal findings: Secondary | ICD-10-CM | POA: Diagnosis not present

## 2017-07-12 DIAGNOSIS — Z90710 Acquired absence of both cervix and uterus: Secondary | ICD-10-CM

## 2017-07-12 DIAGNOSIS — E119 Type 2 diabetes mellitus without complications: Secondary | ICD-10-CM | POA: Diagnosis not present

## 2017-07-12 DIAGNOSIS — Z6841 Body Mass Index (BMI) 40.0 and over, adult: Secondary | ICD-10-CM | POA: Diagnosis not present

## 2017-07-12 MED ORDER — FLUCONAZOLE 150 MG PO TABS: 150.0000 mg | ORAL_TABLET | Freq: Once | ORAL | 4 refills | Status: AC

## 2017-07-12 NOTE — Progress Notes (Signed)
Patient presents for Annual Exam today.  LMP: HYST Mammogram: 08/01/2016 scheduled 07/24/2017 Colonoscopy: up to date  STD Testing: Declined  CC: vaginal discharge possible yeast infection.

## 2017-07-12 NOTE — Progress Notes (Signed)
Subjective:        Kathryn Crosby is a 57 y.o. female here for a routine exam.  Current complaints: None.    Personal health questionnaire:  Is patient Ashkenazi Jewish, have a family history of breast and/or ovarian cancer: no Is there a family history of uterine cancer diagnosed at age < 97, gastrointestinal cancer, urinary tract cancer, family member who is a Field seismologist syndrome-associated carrier: no Is the patient overweight and hypertensive, family history of diabetes, personal history of gestational diabetes, preeclampsia or PCOS: no Is patient over 69, have PCOS,  family history of premature CHD under age 74, diabetes, smoke, have hypertension or peripheral artery disease:  no At any time, has a partner hit, kicked or otherwise hurt or frightened you?: no Over the past 2 weeks, have you felt down, depressed or hopeless?: no Over the past 2 weeks, have you felt little interest or pleasure in doing things?:no   Gynecologic History No LMP recorded. Patient has had a hysterectomy. Contraception: status post hysterectomy Last Pap: 2010. Results were: normal Last mammogram: 2018. Results were: abnormal  Obstetric History OB History  Gravida Para Term Preterm AB Living  '3 3 3     3  ' SAB TAB Ectopic Multiple Live Births          3    # Outcome Date GA Lbr Len/2nd Weight Sex Delivery Anes PTL Lv  3 Term 09/16/81   8 lb (3.629 kg) F Vag-Spont EPI  LIV  2 Term 11/29/77   7 lb 6 oz (3.345 kg) M Vag-Spont EPI  LIV  1 Term 02/29/76   7 lb 9 oz (3.43 kg) M Vag-Spont EPI  LIV    Past Medical History:  Diagnosis Date  . Chronic headaches   . Diabetes mellitus   . GERD (gastroesophageal reflux disease)   . Hyperlipidemia   . Hypertension   . Kidney stones   . Migraine   . Nephrolithiasis   . Varicose veins 03-30-15   Bilateral Leg    Past Surgical History:  Procedure Laterality Date  . ABDOMINAL HYSTERECTOMY    . left ankle fracture    . LITHOTRIPSY    . TONSILLECTOMY        Current Outpatient Medications:  .  ACCU-CHEK SOFTCLIX LANCETS lancets, Use to check blood sugar twice daily.  Dx code: E11.9, E11.40, Disp: 100 each, Rfl: 11 .  amLODipine (NORVASC) 10 MG tablet, TAKE 1 TABLET EVERY DAY, Disp: 90 tablet, Rfl: 3 .  atorvastatin (LIPITOR) 40 MG tablet, TAKE 1 TABLET AT BEDTIME, Disp: 90 tablet, Rfl: 2 .  B-D ULTRAFINE III SHORT PEN 31G X 8 MM MISC, USE WITH INSULIN PEN, Disp: 180 each, Rfl: 5 .  Blood Glucose Calibration (ACCU-CHEK AVIVA) SOLN, Use as directed., Disp: 1 each, Rfl: 0 .  Blood Glucose Monitoring Suppl (ACCU-CHEK AVIVA PLUS) w/Device KIT, USE AS DIRECTED  TO TEST BLOOD SUGAR TWICE DAILY, Disp: 1 kit, Rfl: 0 .  carvedilol (COREG) 25 MG tablet, TAKE 1 TABLET TWICE DAILY  WITH  MEALS., Disp: 180 tablet, Rfl: 3 .  empagliflozin (JARDIANCE) 10 MG TABS tablet, Take 10 mg by mouth daily., Disp: 30 tablet, Rfl: 0 .  glucose blood (ACCU-CHEK AVIVA PLUS) test strip, Check blood sugar three times daily, Disp: 200 each, Rfl: 5 .  Insulin Glargine (LANTUS) 100 UNIT/ML Solostar Pen, Inject 20 Units into the skin daily., Disp: 3 mL, Rfl: 6 .  Lancet Device MISC, To be used with accu-check lancets,  Disp: 1 each, Rfl: 0 .  Lancets Misc. (ACCU-CHEK FASTCLIX LANCET) KIT, Please use to check blood sugar, Disp: 1 kit, Rfl: 0 .  losartan (COZAAR) 50 MG tablet, Take 1 tablet (50 mg total) by mouth daily., Disp: 90 tablet, Rfl: 2 .  metFORMIN (GLUCOPHAGE) 1000 MG tablet, TAKE 1 TABLET TWICE DAILY WITH A MEAL, Disp: 180 tablet, Rfl: 3 .  omeprazole (PRILOSEC) 20 MG capsule, TAKE 1 CAPSULE EVERY DAY, Disp: 90 capsule, Rfl: 3 .  Potassium Citrate 15 MEQ (1620 MG) TBCR, Take 1 tablet by mouth 3 (three) times daily., Disp: 270 tablet, Rfl: 0 .  TRULICITY 1.5 UU/8.2CM SOPN, INJECT 1.5 MG SUBCUTANEOUSLY ONE TIME WEEKLY, Disp: 12 pen, Rfl: 11 .  fluconazole (DIFLUCAN) 150 MG tablet, Take 1 tablet (150 mg total) by mouth once for 1 dose., Disp: 1 tablet, Rfl: 4 .  naproxen  (NAPROSYN) 500 MG tablet, Take 1 tablet (500 mg total) by mouth 2 (two) times daily with a meal. (Patient not taking: Reported on 06/28/2017), Disp: 20 tablet, Rfl: 0 .  NOVOLOG FLEXPEN 100 UNIT/ML FlexPen, INJECT 3 UNITS SUBCUTANEOUSLY THREE TIMES DAILY WITH MEALS (DISCARD AND BEGIN A NEW PEN 28 DAYS AFTER OPENING) (Patient not taking: Reported on 07/12/2017), Disp: 15 mL, Rfl: 0 Allergies  Allergen Reactions  . Aspirin Hives and Shortness Of Breath  . Ivp Dye [Iodinated Diagnostic Agents] Hives and Shortness Of Breath  . Shellfish-Derived Products Hives and Shortness Of Breath  . Lisinopril Cough    Social History   Tobacco Use  . Smoking status: Never Smoker  . Smokeless tobacco: Never Used  Substance Use Topics  . Alcohol use: No    Alcohol/week: 0.0 oz    Family History  Problem Relation Age of Onset  . Diabetes Mother   . Hypertension Mother   . Kidney disease Mother   . Varicose Veins Mother   . Varicose Veins Father   . Coronary artery disease Brother        CABG age 53      Review of Systems  Constitutional: negative for fatigue and weight loss Respiratory: negative for cough and wheezing Cardiovascular: negative for chest pain, fatigue and palpitations Gastrointestinal: negative for abdominal pain and change in bowel habits Musculoskeletal:negative for myalgias Neurological: negative for gait problems and tremors Behavioral/Psych: negative for abusive relationship, depression Endocrine: negative for temperature intolerance    Genitourinary:negative for abnormal menstrual periods, genital lesions, hot flashes, sexual problems and vaginal discharge Integument/breast: negative for breast lump, breast tenderness, nipple discharge and skin lesion(s)    Objective:       BP (!) 139/93   Pulse 74   Wt 265 lb 11.2 oz (120.5 kg)   BMI 40.40 kg/m  General:   alert  Skin:   no rash or abnormalities  Lungs:   clear to auscultation bilaterally  Heart:   regular rate  and rhythm, S1, S2 normal, no murmur, click, rub or gallop  Breasts:   normal without suspicious masses, skin or nipple changes or axillary nodes  Abdomen:  normal findings: no organomegaly, soft, non-tender and no hernia  Pelvis:  External genitalia: normal general appearance Urinary system: urethral meatus normal and bladder without fullness, nontender Vaginal: normal without tenderness, induration or masses Cervix: normal appearance Adnexa: normal bimanual exam Uterus: anteverted and non-tender, normal size   Lab Review Urine pregnancy test Labs reviewed yes Radiologic studies reviewed yes  50% of 20 min visit spent on counseling and coordination of care.  Assessment:     1. Women's annual routine gynecological examination - DOING WELL  2. S/P hysterectomy  3. Vaginal discharge Rx: - Cervicovaginal ancillary only - fluconazole (DIFLUCAN) 150 MG tablet; Take 1 tablet (150 mg total) by mouth once for 1 dose.  Dispense: 1 tablet; Refill: 4  4. Diabetes 1.5, managed as type 2 ([place]) - STABLE  5. Class 3 severe obesity due to excess calories without serious comorbidity with body mass index (BMI) of 40.0 to 44.9 in adult [facility]) - program of caloric reduction, exercise and behavioral modification recommended   Plan:    Education reviewed: calcium supplements, depression evaluation, low fat, low cholesterol diet, safe sex/STD prevention, self breast exams and weight bearing exercise. Follow up in: 1 year.   Meds ordered this encounter  Medications  . fluconazole (DIFLUCAN) 150 MG tablet    Sig: Take 1 tablet (150 mg total) by mouth once for 1 dose.    Dispense:  1 tablet    Refill:  4   No orders of the defined types were placed in this encounter.   Shelly Bombard MD 07-12-2017

## 2017-07-13 ENCOUNTER — Encounter: Payer: Self-pay | Admitting: Internal Medicine

## 2017-07-13 DIAGNOSIS — E119 Type 2 diabetes mellitus without complications: Secondary | ICD-10-CM | POA: Diagnosis not present

## 2017-07-13 LAB — LIPID PANEL
Chol/HDL Ratio: 3.7 ratio (ref 0.0–4.4)
Cholesterol, Total: 108 mg/dL (ref 100–199)
HDL: 29 mg/dL — AB (ref >39)
LDL Calculated: 61 mg/dL (ref 0–99)
Triglycerides: 92 mg/dL (ref 0–149)
VLDL CHOLESTEROL CAL: 18 mg/dL (ref 5–40)

## 2017-07-13 LAB — CERVICOVAGINAL ANCILLARY ONLY
Bacterial vaginitis: NEGATIVE
Candida vaginitis: NEGATIVE
TRICH (WINDOWPATH): NEGATIVE

## 2017-07-14 DIAGNOSIS — E119 Type 2 diabetes mellitus without complications: Secondary | ICD-10-CM | POA: Diagnosis not present

## 2017-07-15 DIAGNOSIS — E119 Type 2 diabetes mellitus without complications: Secondary | ICD-10-CM | POA: Diagnosis not present

## 2017-07-16 DIAGNOSIS — E119 Type 2 diabetes mellitus without complications: Secondary | ICD-10-CM | POA: Diagnosis not present

## 2017-07-16 NOTE — Progress Notes (Signed)
[facility] Phone: [phone]   Date of Visit: 07/17/2017   HPI:  DM2:  - Medications: Jardiance 10mg , Lantus 20 units, Metformin 1000mg  BID, Trulicity 1.5mg  weekly  - started Jardiance three days ago. She is tolerating this fine. No signs of yeast infection  - Novolog is on medication list. Reports she is not currently using. She is unclear why she stopped it.   - reviewed her cbgs. Mainly in the mild to upper 100s.  - she only checks her cbgs in the AM and Lunch and does post-prandial after breakfast and lunch. We discussed the need to check TID before meals ideally and one 2 hr PP until her sugars are better controlled.   Left Sided Pain:  - reports of intermittent left sided pain for the past month.  - she does not currently have it  - reports that she notices mainly at night but sometimes when she is just sitting - she usually sleeps on her right side.  - denies any extremity weakness. Reports that sometimes her hand gets numb. No lower extremity numbness - the neck pain feels like a cramp.  - no aggravating factors she can think of - no alleviating factors - she has not tried anything for symptoms - no chest pain or shortness of breath. No blurred vision, slurred speech, or weakness  - partner reports that patient sits on a sofa couch an falls asleep  ROS: See HPI.  [place]:  PMH: HTN Pulmonary HTN Chronic Venous Insufficiency  OSA GERD DM2 with retinopathy and neuropathy  OA Knee Nephrolithiasis Obesity HLD  PHYSICAL EXAM: BP 110/80   Pulse 84   Temp 97.9 F (36.6 C) (Oral)   Wt 265 lb (120.2 kg)   SpO2 97%   BMI 40.29 kg/m  GEN: NAD HEENT: Atraumatic, normocephalic, neck supple, EOMI, sclera clear  CV: RRR, systolic murmur PULM: CTAB, normal effort ABD: Soft, nontender, nondistended, NABS, no organomegaly MSK: no spinal tenderness or paraspinal tenderness  Spurling test negative.  Normal shoulder ROM Normal strength of  upper extremities bilaterally  HIP: tenderness of the left greater trochanteric bursa. normal ROM. 4-5 strength in the left lower extremity with abduction and hip flexion due to pain of the lateral hip. Normal sensation to light touch in upper and lower extremities bilaterally. Equal patellar reflexes bilaterally.   SKIN: No rash or cyanosis; warm and well-perfused EXTR: No lower extremity edema or calf tenderness PSYCH: Mood and affect euthymic, normal rate and volume of speech NEURO: Awake, alert, CN 2-12 grossly intact, normal speech  ASSESSMENT/PLAN:  DM (diabetes mellitus), type 2 ([place]) Patient started Jardiance 3 days ago. Therefore will recommend continuing current medication as is. She is to call me in 1 week. After one week, we decided to start Novolog 3 units TID. She reviewed goal cbgs. Reviewed plan for hypoglycemia.   Greater Trochanteric Bursitis of the Left Hip:  Discussed options for management. Steroid injection may not be the best option currently due to her DM2. She also declined injection. PRN Tylenol or Ibuprofen. Exercises given.   Neck Pain on the Left Side:  No symptoms today and her neck and upper extremity exam are normal. No red flags concerning for CVA/TIA or cardiac etiology. Did consider atypical presentation of cardiac etiology. She had cardiac evaluation with ECHO and stress test in 2016 done which was normal. Possibly MSK; I recommended trying not to fall sleep sitting on her couch. Monitor symptoms. Return precautions discussed.   Waldon Reining  Barbette Reichmann, MD PGY [place]

## 2017-07-17 ENCOUNTER — Ambulatory Visit (INDEPENDENT_AMBULATORY_CARE_PROVIDER_SITE_OTHER): Payer: Medicare HMO | Admitting: Internal Medicine

## 2017-07-17 ENCOUNTER — Other Ambulatory Visit: Payer: Self-pay

## 2017-07-17 ENCOUNTER — Encounter: Payer: Self-pay | Admitting: Internal Medicine

## 2017-07-17 VITALS — BP 110/80 | HR 84 | Temp 97.9°F | Wt 265.0 lb

## 2017-07-17 DIAGNOSIS — M542 Cervicalgia: Secondary | ICD-10-CM

## 2017-07-17 DIAGNOSIS — M7062 Trochanteric bursitis, left hip: Secondary | ICD-10-CM | POA: Diagnosis not present

## 2017-07-17 DIAGNOSIS — E1142 Type 2 diabetes mellitus with diabetic polyneuropathy: Secondary | ICD-10-CM | POA: Diagnosis not present

## 2017-07-17 DIAGNOSIS — E119 Type 2 diabetes mellitus without complications: Secondary | ICD-10-CM | POA: Diagnosis not present

## 2017-07-17 MED ORDER — INSULIN ASPART 100 UNIT/ML FLEXPEN: 3.0000 [IU] | PEN_INJECTOR | Freq: Three times a day (TID) | SUBCUTANEOUS | 0 refills | Status: AC

## 2017-07-17 NOTE — Assessment & Plan Note (Signed)
Patient started Jardiance 3 days ago. Therefore will recommend continuing current medication as is. She is to call me in 1 week. After one week, we decided to start Novolog 3 units TID. She reviewed goal cbgs. Reviewed plan for hypoglycemia.

## 2017-07-17 NOTE — Patient Instructions (Addendum)
Your goal sugars should be between 80-130   1) Continue the rest of your medication as is. After 1 week, restart Novolog 3 units three times a day with meals.  2) check your sugar three times a day  3) you have trocheric bursitis. Please try the exercises below and as needed Tylenol and Ibuprofen  4) call me in a week.  5) call about shingles vaccine  Trochanteric Bursitis Trochanteric bursitis is a condition that causes hip pain. Trochanteric bursitis happens when fluid-filled sacs (bursae) in the hip get irritated. Normally these sacs absorb shock and help strong bands of tissue (tendons) in your hip glide smoothly over each other and over your hip bones. What are the causes? This condition results from increased friction between the hip bones and the tendons that go over them. This condition can happen if you:  Have weak hips.  Use your hip muscles too much (overuse).  Get hit in the hip.  What increases the risk? This condition is more likely to develop in:  Women.  Adults who are middle-aged or older.  People with arthritis or a spinal condition.  People with weak buttocks muscles (gluteal muscles).  People who have one leg that is shorter than the other.  People who participate in certain kinds of athletic activities, such as: ? Running sports, especially long-distance running. ? Contact sports, like football or martial arts. ? Sports in which falls may occur, like skiing.  What are the signs or symptoms? The main symptom of this condition is pain and tenderness over the point of your hip. The pain may be:  Sharp and intense.  Dull and achy.  Felt on the outside of your thigh.  It may increase when you:  Lie on your side.  Walk or run.  Go up on stairs.  Sit.  Stand up after sitting.  Stand for long periods of time.  How is this diagnosed? This condition may be diagnosed based on:  Your symptoms.  Your medical history.  A physical  exam.  Imaging tests, such as: ? X-rays to check your bones. ? An MRI or ultrasound to check your tendons and muscles.  During your physical exam, your health care provider will check the movement and strength of your hip. He or she may press on the point of your hip to check for pain. How is this treated? This condition may be treated by:  Resting.  Reducing your activity.  Avoiding activities that cause pain.  Using crutches, a cane, or a walker to decrease the strain on your hip.  Taking medicine to help with swelling.  Having medicine injected into the bursae to help with swelling.  Using ice, heat, and massage therapy for pain relief.  Physical therapy exercises for strength and flexibility.  Surgery (rare).  Follow these instructions at home: Activity  Rest.  Avoid activities that cause pain.  Return to your normal activities as told by your health care provider. Ask your health care provider what activities are safe for you. Managing pain, stiffness, and swelling  Take over-the-counter and prescription medicines only as told by your health care provider.  If directed, apply heat to the injured area as told by your health care provider. ? Place a towel between your skin and the heat source. ? Leave the heat on for 20-30 minutes. ? Remove the heat if your skin turns bright red. This is especially important if you are unable to feel pain, heat, or cold. You may  have a greater risk of getting burned.  If directed, apply ice to the injured area: ? Put ice in a plastic bag. ? Place a towel between your skin and the bag. ? Leave the ice on for 20 minutes, 2-3 times a day. General instructions  If the affected leg is one that you use for driving, ask your health care provider when it is safe to drive.  Use crutches, a cane, or a walker as told by your health care provider.  If one of your legs is shorter than the other, get fitted for a shoe insert.  Lose  weight if you are overweight. How is this prevented?  Wear supportive footwear that is appropriate for your sport.  If you have hip pain, start any new exercise or sport slowly.  Maintain physical fitness, including: ? Strength. ? Flexibility. Contact a health care provider if:  Your pain does not improve with 2-4 weeks. Get help right away if:  You develop severe pain.  You have a fever.  You develop increased redness over your hip.  You have a change in your bowel function or bladder function.  You cannot control the muscles in your feet. This information is not intended to replace advice given to you by your health care provider. Make sure you discuss any questions you have with your health care provider. Document Released: 02/25/2004 Document Revised: 09/23/2015 Document Reviewed: 01/02/2015 Elsevier Interactive Patient Education  2018 [employer].    Trochanteric Bursitis Rehab Ask your health care provider which exercises are safe for you. Do exercises exactly as told by your health care provider and adjust them as directed. It is normal to feel mild stretching, pulling, tightness, or discomfort as you do these exercises, but you should stop right away if you feel sudden pain or your pain gets worse.Do not begin these exercises until told by your health care provider. Stretching exercises These exercises warm up your muscles and joints and improve the movement and flexibility of your hip. These exercises also help to relieve pain and stiffness. Exercise A: Iliotibial band stretch  1. Lie on your side with your left / right leg in the top position. 2. Bend your left / right knee and grab your ankle. 3. Slowly bring your knee back so your thigh is behind your body. 4. Slowly lower your knee toward the floor until you feel a gentle stretch on the outside of your left / right thigh. If you do not feel a stretch and your knee will not fall farther, place the heel of your  other foot on top of your outer knee and pull your thigh down farther. 5. Hold this position for __________ seconds. 6. Slowly return to the starting position. Repeat __________ times. Complete this exercise __________ times a day. Strengthening exercises These exercises build strength and endurance in your hip and pelvis. Endurance is the ability to use your muscles for a long time, even after they get tired. Exercise B: Bridge ( hip extensors) 1. Lie on your back on a firm surface with your knees bent and your feet flat on the floor. 2. Tighten your buttocks muscles and lift your buttocks off the floor until your trunk is level with your thighs. You should feel the muscles working in your buttocks and the back of your thighs. If this exercise is too easy, try doing it with your arms crossed over your chest. 3. Hold this position for __________ seconds. 4. Slowly return to the starting  position. 5. Let your muscles relax completely between repetitions. Repeat __________ times. Complete this exercise __________ times a day. Exercise C: Squats ( knee extensors and  quadriceps) 1. Stand in front of a table, with your feet and knees pointing straight ahead. You may rest your hands on the table for balance but not for support. 2. Slowly bend your knees and lower your hips like you are going to sit in a chair. ? Keep your weight over your heels, not over your toes. ? Keep your lower legs upright so they are parallel with the table legs. ? Do not let your hips go lower than your knees. ? Do not bend lower than told by your health care provider. ? If your hip pain increases, do not bend as low. 3. Hold this position for __________ seconds. 4. Slowly push with your legs to return to standing. Do not use your hands to pull yourself to standing. Repeat __________ times. Complete this exercise __________ times a day. Exercise D: Hip hike 1. Stand sideways on a bottom step. Stand on your left / right  leg with your other foot unsupported next to the step. You can hold onto the railing or wall if needed for balance. 2. Keeping your knees straight and your torso square, lift your left / right hip up toward the ceiling. 3. Hold this position for __________ seconds. 4. Slowly let your left / right hip lower toward the floor, past the starting position. Your foot should get closer to the floor. Do not lean or bend your knees. Repeat __________ times. Complete this exercise __________ times a day. Exercise E: Single leg stand 1. Stand near a counter or door frame that you can hold onto for balance as needed. It is helpful to stand in front of a mirror for this exercise so you can watch your hip. 2. Squeeze your left / right buttock muscles then lift up your other foot. Do not let your left / right hip push out to the side. 3. Hold this position for __________ seconds. Repeat __________ times. Complete this exercise __________ times a day. This information is not intended to replace advice given to you by your health care provider. Make sure you discuss any questions you have with your health care provider. Document Released: 02/25/2004 Document Revised: 09/24/2015 Document Reviewed: 01/02/2015 Elsevier Interactive Patient Education  [employer].

## 2017-07-18 DIAGNOSIS — E119 Type 2 diabetes mellitus without complications: Secondary | ICD-10-CM | POA: Diagnosis not present

## 2017-07-19 DIAGNOSIS — E119 Type 2 diabetes mellitus without complications: Secondary | ICD-10-CM | POA: Diagnosis not present

## 2017-07-20 DIAGNOSIS — E119 Type 2 diabetes mellitus without complications: Secondary | ICD-10-CM | POA: Diagnosis not present

## 2017-07-21 DIAGNOSIS — E119 Type 2 diabetes mellitus without complications: Secondary | ICD-10-CM | POA: Diagnosis not present

## 2017-07-22 DIAGNOSIS — E119 Type 2 diabetes mellitus without complications: Secondary | ICD-10-CM | POA: Diagnosis not present

## 2017-07-23 DIAGNOSIS — E119 Type 2 diabetes mellitus without complications: Secondary | ICD-10-CM | POA: Diagnosis not present

## 2017-07-24 ENCOUNTER — Ambulatory Visit: Payer: Medicare HMO

## 2017-07-24 ENCOUNTER — Other Ambulatory Visit: Payer: Self-pay | Admitting: Internal Medicine

## 2017-07-24 DIAGNOSIS — K219 Gastro-esophageal reflux disease without esophagitis: Secondary | ICD-10-CM

## 2017-07-24 DIAGNOSIS — E119 Type 2 diabetes mellitus without complications: Secondary | ICD-10-CM | POA: Diagnosis not present

## 2017-07-25 DIAGNOSIS — E119 Type 2 diabetes mellitus without complications: Secondary | ICD-10-CM | POA: Diagnosis not present

## 2017-07-26 DIAGNOSIS — E119 Type 2 diabetes mellitus without complications: Secondary | ICD-10-CM | POA: Diagnosis not present

## 2017-07-27 DIAGNOSIS — E119 Type 2 diabetes mellitus without complications: Secondary | ICD-10-CM | POA: Diagnosis not present

## 2017-07-28 DIAGNOSIS — E119 Type 2 diabetes mellitus without complications: Secondary | ICD-10-CM | POA: Diagnosis not present

## 2017-07-29 DIAGNOSIS — E119 Type 2 diabetes mellitus without complications: Secondary | ICD-10-CM | POA: Diagnosis not present

## 2017-07-30 DIAGNOSIS — E119 Type 2 diabetes mellitus without complications: Secondary | ICD-10-CM | POA: Diagnosis not present

## 2017-07-31 DIAGNOSIS — E119 Type 2 diabetes mellitus without complications: Secondary | ICD-10-CM | POA: Diagnosis not present

## 2017-08-01 DIAGNOSIS — E119 Type 2 diabetes mellitus without complications: Secondary | ICD-10-CM | POA: Diagnosis not present

## 2017-08-02 DIAGNOSIS — E119 Type 2 diabetes mellitus without complications: Secondary | ICD-10-CM | POA: Diagnosis not present

## 2017-08-03 DIAGNOSIS — E119 Type 2 diabetes mellitus without complications: Secondary | ICD-10-CM | POA: Diagnosis not present

## 2017-08-04 DIAGNOSIS — E119 Type 2 diabetes mellitus without complications: Secondary | ICD-10-CM | POA: Diagnosis not present

## 2017-08-05 DIAGNOSIS — E119 Type 2 diabetes mellitus without complications: Secondary | ICD-10-CM | POA: Diagnosis not present

## 2017-08-06 DIAGNOSIS — E119 Type 2 diabetes mellitus without complications: Secondary | ICD-10-CM | POA: Diagnosis not present

## 2017-08-07 DIAGNOSIS — E119 Type 2 diabetes mellitus without complications: Secondary | ICD-10-CM | POA: Diagnosis not present

## 2017-08-08 DIAGNOSIS — E119 Type 2 diabetes mellitus without complications: Secondary | ICD-10-CM | POA: Diagnosis not present

## 2017-08-09 DIAGNOSIS — E119 Type 2 diabetes mellitus without complications: Secondary | ICD-10-CM | POA: Diagnosis not present

## 2017-08-10 DIAGNOSIS — E119 Type 2 diabetes mellitus without complications: Secondary | ICD-10-CM | POA: Diagnosis not present

## 2017-08-11 DIAGNOSIS — E119 Type 2 diabetes mellitus without complications: Secondary | ICD-10-CM | POA: Diagnosis not present

## 2017-08-12 DIAGNOSIS — E119 Type 2 diabetes mellitus without complications: Secondary | ICD-10-CM | POA: Diagnosis not present

## 2017-08-13 DIAGNOSIS — E119 Type 2 diabetes mellitus without complications: Secondary | ICD-10-CM | POA: Diagnosis not present

## 2017-08-14 DIAGNOSIS — E119 Type 2 diabetes mellitus without complications: Secondary | ICD-10-CM | POA: Diagnosis not present

## 2017-08-15 DIAGNOSIS — E119 Type 2 diabetes mellitus without complications: Secondary | ICD-10-CM | POA: Diagnosis not present

## 2017-08-16 ENCOUNTER — Other Ambulatory Visit: Payer: Self-pay | Admitting: Internal Medicine

## 2017-08-16 DIAGNOSIS — E119 Type 2 diabetes mellitus without complications: Secondary | ICD-10-CM | POA: Diagnosis not present

## 2017-08-17 DIAGNOSIS — E119 Type 2 diabetes mellitus without complications: Secondary | ICD-10-CM | POA: Diagnosis not present

## 2017-08-18 ENCOUNTER — Ambulatory Visit: Payer: Medicare HMO

## 2017-08-18 DIAGNOSIS — E119 Type 2 diabetes mellitus without complications: Secondary | ICD-10-CM | POA: Diagnosis not present

## 2017-08-19 DIAGNOSIS — E119 Type 2 diabetes mellitus without complications: Secondary | ICD-10-CM | POA: Diagnosis not present

## 2017-08-20 DIAGNOSIS — E119 Type 2 diabetes mellitus without complications: Secondary | ICD-10-CM | POA: Diagnosis not present

## 2017-08-21 DIAGNOSIS — E119 Type 2 diabetes mellitus without complications: Secondary | ICD-10-CM | POA: Diagnosis not present

## 2017-08-22 DIAGNOSIS — E119 Type 2 diabetes mellitus without complications: Secondary | ICD-10-CM | POA: Diagnosis not present

## 2017-08-23 DIAGNOSIS — E119 Type 2 diabetes mellitus without complications: Secondary | ICD-10-CM | POA: Diagnosis not present

## 2017-08-24 ENCOUNTER — Other Ambulatory Visit: Payer: Self-pay | Admitting: Obstetrics

## 2017-08-24 DIAGNOSIS — E119 Type 2 diabetes mellitus without complications: Secondary | ICD-10-CM | POA: Diagnosis not present

## 2017-08-25 DIAGNOSIS — E119 Type 2 diabetes mellitus without complications: Secondary | ICD-10-CM | POA: Diagnosis not present

## 2017-08-26 DIAGNOSIS — E119 Type 2 diabetes mellitus without complications: Secondary | ICD-10-CM | POA: Diagnosis not present

## 2017-08-27 DIAGNOSIS — E119 Type 2 diabetes mellitus without complications: Secondary | ICD-10-CM | POA: Diagnosis not present

## 2017-08-28 DIAGNOSIS — E119 Type 2 diabetes mellitus without complications: Secondary | ICD-10-CM | POA: Diagnosis not present

## 2017-08-29 DIAGNOSIS — G4733 Obstructive sleep apnea (adult) (pediatric): Secondary | ICD-10-CM | POA: Diagnosis not present

## 2017-08-29 DIAGNOSIS — E119 Type 2 diabetes mellitus without complications: Secondary | ICD-10-CM | POA: Diagnosis not present

## 2017-08-30 DIAGNOSIS — E119 Type 2 diabetes mellitus without complications: Secondary | ICD-10-CM | POA: Diagnosis not present

## 2017-08-31 ENCOUNTER — Other Ambulatory Visit: Payer: Self-pay | Admitting: Family Medicine

## 2017-08-31 DIAGNOSIS — E119 Type 2 diabetes mellitus without complications: Secondary | ICD-10-CM | POA: Diagnosis not present

## 2017-09-01 DIAGNOSIS — E119 Type 2 diabetes mellitus without complications: Secondary | ICD-10-CM | POA: Diagnosis not present

## 2017-09-02 DIAGNOSIS — E119 Type 2 diabetes mellitus without complications: Secondary | ICD-10-CM | POA: Diagnosis not present

## 2017-09-03 DIAGNOSIS — E119 Type 2 diabetes mellitus without complications: Secondary | ICD-10-CM | POA: Diagnosis not present

## 2017-09-04 DIAGNOSIS — E119 Type 2 diabetes mellitus without complications: Secondary | ICD-10-CM | POA: Diagnosis not present

## 2017-09-05 DIAGNOSIS — E119 Type 2 diabetes mellitus without complications: Secondary | ICD-10-CM | POA: Diagnosis not present

## 2017-09-06 DIAGNOSIS — E119 Type 2 diabetes mellitus without complications: Secondary | ICD-10-CM | POA: Diagnosis not present

## 2017-09-07 DIAGNOSIS — E119 Type 2 diabetes mellitus without complications: Secondary | ICD-10-CM | POA: Diagnosis not present

## 2017-09-08 DIAGNOSIS — E119 Type 2 diabetes mellitus without complications: Secondary | ICD-10-CM | POA: Diagnosis not present

## 2017-09-09 DIAGNOSIS — E119 Type 2 diabetes mellitus without complications: Secondary | ICD-10-CM | POA: Diagnosis not present

## 2017-09-10 DIAGNOSIS — E119 Type 2 diabetes mellitus without complications: Secondary | ICD-10-CM | POA: Diagnosis not present

## 2017-09-11 DIAGNOSIS — E119 Type 2 diabetes mellitus without complications: Secondary | ICD-10-CM | POA: Diagnosis not present

## 2017-09-12 DIAGNOSIS — E119 Type 2 diabetes mellitus without complications: Secondary | ICD-10-CM | POA: Diagnosis not present

## 2017-09-13 DIAGNOSIS — E119 Type 2 diabetes mellitus without complications: Secondary | ICD-10-CM | POA: Diagnosis not present

## 2017-09-14 ENCOUNTER — Ambulatory Visit
Admission: RE | Admit: 2017-09-14 | Discharge: 2017-09-14 | Disposition: A | Discharge status: Home / Self Care | Payer: Medicare HMO | Source: Ambulatory Visit | Attending: Family Medicine | Admitting: Family Medicine

## 2017-09-14 DIAGNOSIS — E119 Type 2 diabetes mellitus without complications: Secondary | ICD-10-CM | POA: Diagnosis not present

## 2017-09-14 DIAGNOSIS — Z1231 Encounter for screening mammogram for malignant neoplasm of breast: Secondary | ICD-10-CM

## 2017-09-15 DIAGNOSIS — E119 Type 2 diabetes mellitus without complications: Secondary | ICD-10-CM | POA: Diagnosis not present

## 2017-09-16 DIAGNOSIS — E119 Type 2 diabetes mellitus without complications: Secondary | ICD-10-CM | POA: Diagnosis not present

## 2017-09-17 DIAGNOSIS — E119 Type 2 diabetes mellitus without complications: Secondary | ICD-10-CM | POA: Diagnosis not present

## 2017-09-18 DIAGNOSIS — E119 Type 2 diabetes mellitus without complications: Secondary | ICD-10-CM | POA: Diagnosis not present

## 2017-09-19 DIAGNOSIS — E119 Type 2 diabetes mellitus without complications: Secondary | ICD-10-CM | POA: Diagnosis not present

## 2017-09-20 DIAGNOSIS — E119 Type 2 diabetes mellitus without complications: Secondary | ICD-10-CM | POA: Diagnosis not present

## 2017-09-21 DIAGNOSIS — E119 Type 2 diabetes mellitus without complications: Secondary | ICD-10-CM | POA: Diagnosis not present

## 2017-09-22 DIAGNOSIS — E119 Type 2 diabetes mellitus without complications: Secondary | ICD-10-CM | POA: Diagnosis not present

## 2017-09-23 DIAGNOSIS — E119 Type 2 diabetes mellitus without complications: Secondary | ICD-10-CM | POA: Diagnosis not present

## 2017-09-24 DIAGNOSIS — E119 Type 2 diabetes mellitus without complications: Secondary | ICD-10-CM | POA: Diagnosis not present

## 2017-09-25 DIAGNOSIS — E119 Type 2 diabetes mellitus without complications: Secondary | ICD-10-CM | POA: Diagnosis not present

## 2017-09-26 DIAGNOSIS — E119 Type 2 diabetes mellitus without complications: Secondary | ICD-10-CM | POA: Diagnosis not present

## 2017-09-27 DIAGNOSIS — E119 Type 2 diabetes mellitus without complications: Secondary | ICD-10-CM | POA: Diagnosis not present

## 2017-09-28 DIAGNOSIS — E119 Type 2 diabetes mellitus without complications: Secondary | ICD-10-CM | POA: Diagnosis not present

## 2017-09-29 DIAGNOSIS — E119 Type 2 diabetes mellitus without complications: Secondary | ICD-10-CM | POA: Diagnosis not present

## 2017-09-30 DIAGNOSIS — E119 Type 2 diabetes mellitus without complications: Secondary | ICD-10-CM | POA: Diagnosis not present

## 2017-10-01 DIAGNOSIS — E119 Type 2 diabetes mellitus without complications: Secondary | ICD-10-CM | POA: Diagnosis not present

## 2017-10-02 DIAGNOSIS — E119 Type 2 diabetes mellitus without complications: Secondary | ICD-10-CM | POA: Diagnosis not present

## 2017-10-03 ENCOUNTER — Other Ambulatory Visit: Payer: Self-pay

## 2017-10-03 DIAGNOSIS — E119 Type 2 diabetes mellitus without complications: Secondary | ICD-10-CM | POA: Diagnosis not present

## 2017-10-04 DIAGNOSIS — E119 Type 2 diabetes mellitus without complications: Secondary | ICD-10-CM | POA: Diagnosis not present

## 2017-10-05 ENCOUNTER — Other Ambulatory Visit: Payer: Self-pay | Admitting: Obstetrics

## 2017-10-05 DIAGNOSIS — R5383 Other fatigue: Secondary | ICD-10-CM | POA: Diagnosis not present

## 2017-10-05 DIAGNOSIS — N12 Tubulo-interstitial nephritis, not specified as acute or chronic: Secondary | ICD-10-CM | POA: Diagnosis not present

## 2017-10-05 DIAGNOSIS — R109 Unspecified abdominal pain: Secondary | ICD-10-CM | POA: Diagnosis not present

## 2017-10-05 DIAGNOSIS — R1032 Left lower quadrant pain: Secondary | ICD-10-CM | POA: Diagnosis not present

## 2017-10-05 DIAGNOSIS — E119 Type 2 diabetes mellitus without complications: Secondary | ICD-10-CM | POA: Diagnosis not present

## 2017-10-05 MED ORDER — GLUCOSE BLOOD VI STRP: ORAL_STRIP | 3 refills | Status: DC

## 2017-10-05 MED ORDER — ALCOHOL SWABS PADS: MEDICATED_PAD | 3 refills | Status: DC

## 2017-10-05 MED ORDER — EMPAGLIFLOZIN 10 MG PO TABS: 10.0000 mg | ORAL_TABLET | Freq: Every day | ORAL | 3 refills | Status: DC

## 2017-10-05 MED ORDER — ACCU-CHEK AVIVA PLUS W/DEVICE KIT: PACK | 0 refills | Status: DC

## 2017-10-05 MED ORDER — ACCU-CHEK SOFTCLIX LANCETS MISC: 3 refills | Status: AC

## 2017-10-05 MED ORDER — ACCU-CHEK AVIVA VI SOLN: 0 refills | Status: AC

## 2017-10-05 NOTE — Telephone Encounter (Signed)
Refill request for Fluconazole 200mg  tablet   Please send if approved

## 2017-10-05 NOTE — Telephone Encounter (Signed)
Fluconazole Rx

## 2017-10-06 DIAGNOSIS — E119 Type 2 diabetes mellitus without complications: Secondary | ICD-10-CM | POA: Diagnosis not present

## 2017-10-07 DIAGNOSIS — E119 Type 2 diabetes mellitus without complications: Secondary | ICD-10-CM | POA: Diagnosis not present

## 2017-10-08 DIAGNOSIS — E119 Type 2 diabetes mellitus without complications: Secondary | ICD-10-CM | POA: Diagnosis not present

## 2017-10-09 ENCOUNTER — Ambulatory Visit: Payer: Medicare HMO

## 2017-10-10 DIAGNOSIS — R82998 Other abnormal findings in urine: Secondary | ICD-10-CM | POA: Diagnosis not present

## 2017-10-10 DIAGNOSIS — N200 Calculus of kidney: Secondary | ICD-10-CM | POA: Diagnosis not present

## 2017-10-13 DIAGNOSIS — R82998 Other abnormal findings in urine: Secondary | ICD-10-CM | POA: Diagnosis not present

## 2017-10-13 DIAGNOSIS — N200 Calculus of kidney: Secondary | ICD-10-CM | POA: Diagnosis not present

## 2017-10-16 DIAGNOSIS — E119 Type 2 diabetes mellitus without complications: Secondary | ICD-10-CM | POA: Diagnosis not present

## 2017-10-17 DIAGNOSIS — E119 Type 2 diabetes mellitus without complications: Secondary | ICD-10-CM | POA: Diagnosis not present

## 2017-10-18 DIAGNOSIS — E119 Type 2 diabetes mellitus without complications: Secondary | ICD-10-CM | POA: Diagnosis not present

## 2017-10-19 DIAGNOSIS — E119 Type 2 diabetes mellitus without complications: Secondary | ICD-10-CM | POA: Diagnosis not present

## 2017-10-20 DIAGNOSIS — E119 Type 2 diabetes mellitus without complications: Secondary | ICD-10-CM | POA: Diagnosis not present

## 2017-10-21 DIAGNOSIS — E119 Type 2 diabetes mellitus without complications: Secondary | ICD-10-CM | POA: Diagnosis not present

## 2017-10-22 DIAGNOSIS — E119 Type 2 diabetes mellitus without complications: Secondary | ICD-10-CM | POA: Diagnosis not present

## 2017-10-23 DIAGNOSIS — E119 Type 2 diabetes mellitus without complications: Secondary | ICD-10-CM | POA: Diagnosis not present

## 2017-10-24 DIAGNOSIS — E119 Type 2 diabetes mellitus without complications: Secondary | ICD-10-CM | POA: Diagnosis not present

## 2017-10-25 DIAGNOSIS — E119 Type 2 diabetes mellitus without complications: Secondary | ICD-10-CM | POA: Diagnosis not present

## 2017-10-26 DIAGNOSIS — E119 Type 2 diabetes mellitus without complications: Secondary | ICD-10-CM | POA: Diagnosis not present

## 2017-10-27 DIAGNOSIS — E119 Type 2 diabetes mellitus without complications: Secondary | ICD-10-CM | POA: Diagnosis not present

## 2017-10-28 DIAGNOSIS — E119 Type 2 diabetes mellitus without complications: Secondary | ICD-10-CM | POA: Diagnosis not present

## 2017-10-29 DIAGNOSIS — E119 Type 2 diabetes mellitus without complications: Secondary | ICD-10-CM | POA: Diagnosis not present

## 2017-10-30 DIAGNOSIS — E119 Type 2 diabetes mellitus without complications: Secondary | ICD-10-CM | POA: Diagnosis not present

## 2017-10-31 DIAGNOSIS — E119 Type 2 diabetes mellitus without complications: Secondary | ICD-10-CM | POA: Diagnosis not present

## 2017-11-01 DIAGNOSIS — E119 Type 2 diabetes mellitus without complications: Secondary | ICD-10-CM | POA: Diagnosis not present

## 2017-11-02 ENCOUNTER — Ambulatory Visit: Payer: Medicare HMO | Admitting: Family Medicine

## 2017-11-02 DIAGNOSIS — E119 Type 2 diabetes mellitus without complications: Secondary | ICD-10-CM | POA: Diagnosis not present

## 2017-11-03 DIAGNOSIS — E119 Type 2 diabetes mellitus without complications: Secondary | ICD-10-CM | POA: Diagnosis not present

## 2017-11-04 DIAGNOSIS — E119 Type 2 diabetes mellitus without complications: Secondary | ICD-10-CM | POA: Diagnosis not present

## 2017-11-05 DIAGNOSIS — E119 Type 2 diabetes mellitus without complications: Secondary | ICD-10-CM | POA: Diagnosis not present

## 2017-11-06 DIAGNOSIS — E119 Type 2 diabetes mellitus without complications: Secondary | ICD-10-CM | POA: Diagnosis not present

## 2017-11-07 DIAGNOSIS — E119 Type 2 diabetes mellitus without complications: Secondary | ICD-10-CM | POA: Diagnosis not present

## 2017-11-08 DIAGNOSIS — E119 Type 2 diabetes mellitus without complications: Secondary | ICD-10-CM | POA: Diagnosis not present

## 2017-11-09 DIAGNOSIS — E119 Type 2 diabetes mellitus without complications: Secondary | ICD-10-CM | POA: Diagnosis not present

## 2017-11-09 DIAGNOSIS — Z6841 Body Mass Index (BMI) 40.0 and over, adult: Secondary | ICD-10-CM | POA: Diagnosis not present

## 2017-11-09 DIAGNOSIS — G8929 Other chronic pain: Secondary | ICD-10-CM | POA: Diagnosis not present

## 2017-11-09 DIAGNOSIS — M1712 Unilateral primary osteoarthritis, left knee: Secondary | ICD-10-CM | POA: Diagnosis not present

## 2017-11-09 DIAGNOSIS — E1149 Type 2 diabetes mellitus with other diabetic neurological complication: Secondary | ICD-10-CM | POA: Diagnosis not present

## 2017-11-09 DIAGNOSIS — M25562 Pain in left knee: Secondary | ICD-10-CM | POA: Diagnosis not present

## 2017-11-09 DIAGNOSIS — M25552 Pain in left hip: Secondary | ICD-10-CM | POA: Diagnosis not present

## 2017-11-10 DIAGNOSIS — E119 Type 2 diabetes mellitus without complications: Secondary | ICD-10-CM | POA: Diagnosis not present

## 2017-11-11 DIAGNOSIS — E119 Type 2 diabetes mellitus without complications: Secondary | ICD-10-CM | POA: Diagnosis not present

## 2017-11-12 DIAGNOSIS — E119 Type 2 diabetes mellitus without complications: Secondary | ICD-10-CM | POA: Diagnosis not present

## 2017-11-13 DIAGNOSIS — E119 Type 2 diabetes mellitus without complications: Secondary | ICD-10-CM | POA: Diagnosis not present

## 2017-11-14 DIAGNOSIS — M1712 Unilateral primary osteoarthritis, left knee: Secondary | ICD-10-CM | POA: Diagnosis not present

## 2017-11-14 DIAGNOSIS — E119 Type 2 diabetes mellitus without complications: Secondary | ICD-10-CM | POA: Diagnosis not present

## 2017-11-15 ENCOUNTER — Other Ambulatory Visit: Payer: Self-pay | Admitting: Internal Medicine

## 2017-11-15 DIAGNOSIS — M1712 Unilateral primary osteoarthritis, left knee: Secondary | ICD-10-CM | POA: Diagnosis not present

## 2017-11-15 DIAGNOSIS — E119 Type 2 diabetes mellitus without complications: Secondary | ICD-10-CM | POA: Diagnosis not present

## 2017-11-16 DIAGNOSIS — M25552 Pain in left hip: Secondary | ICD-10-CM | POA: Diagnosis not present

## 2017-11-16 DIAGNOSIS — E119 Type 2 diabetes mellitus without complications: Secondary | ICD-10-CM | POA: Diagnosis not present

## 2017-11-16 DIAGNOSIS — M5442 Lumbago with sciatica, left side: Secondary | ICD-10-CM | POA: Diagnosis not present

## 2017-11-16 DIAGNOSIS — M5416 Radiculopathy, lumbar region: Secondary | ICD-10-CM | POA: Diagnosis not present

## 2017-11-17 DIAGNOSIS — E119 Type 2 diabetes mellitus without complications: Secondary | ICD-10-CM | POA: Diagnosis not present

## 2017-11-18 DIAGNOSIS — E119 Type 2 diabetes mellitus without complications: Secondary | ICD-10-CM | POA: Diagnosis not present

## 2017-11-19 DIAGNOSIS — E119 Type 2 diabetes mellitus without complications: Secondary | ICD-10-CM | POA: Diagnosis not present

## 2017-11-20 DIAGNOSIS — E119 Type 2 diabetes mellitus without complications: Secondary | ICD-10-CM | POA: Diagnosis not present

## 2017-11-21 DIAGNOSIS — E119 Type 2 diabetes mellitus without complications: Secondary | ICD-10-CM | POA: Diagnosis not present

## 2017-11-22 DIAGNOSIS — G4733 Obstructive sleep apnea (adult) (pediatric): Secondary | ICD-10-CM | POA: Diagnosis not present

## 2017-11-22 DIAGNOSIS — Z9989 Dependence on other enabling machines and devices: Secondary | ICD-10-CM | POA: Diagnosis not present

## 2017-11-22 DIAGNOSIS — E119 Type 2 diabetes mellitus without complications: Secondary | ICD-10-CM | POA: Diagnosis not present

## 2017-11-23 DIAGNOSIS — E119 Type 2 diabetes mellitus without complications: Secondary | ICD-10-CM | POA: Diagnosis not present

## 2017-11-24 DIAGNOSIS — E119 Type 2 diabetes mellitus without complications: Secondary | ICD-10-CM | POA: Diagnosis not present

## 2017-11-25 DIAGNOSIS — Z91041 Radiographic dye allergy status: Secondary | ICD-10-CM | POA: Diagnosis not present

## 2017-11-25 DIAGNOSIS — Z886 Allergy status to analgesic agent status: Secondary | ICD-10-CM | POA: Diagnosis not present

## 2017-11-25 DIAGNOSIS — E1169 Type 2 diabetes mellitus with other specified complication: Secondary | ICD-10-CM | POA: Diagnosis not present

## 2017-11-25 DIAGNOSIS — M5442 Lumbago with sciatica, left side: Secondary | ICD-10-CM | POA: Diagnosis not present

## 2017-11-25 DIAGNOSIS — M25552 Pain in left hip: Secondary | ICD-10-CM | POA: Diagnosis not present

## 2017-11-25 DIAGNOSIS — I10 Essential (primary) hypertension: Secondary | ICD-10-CM | POA: Diagnosis not present

## 2017-11-25 DIAGNOSIS — Z794 Long term (current) use of insulin: Secondary | ICD-10-CM | POA: Diagnosis not present

## 2017-11-25 DIAGNOSIS — E119 Type 2 diabetes mellitus without complications: Secondary | ICD-10-CM | POA: Diagnosis not present

## 2017-11-25 DIAGNOSIS — E785 Hyperlipidemia, unspecified: Secondary | ICD-10-CM | POA: Diagnosis not present

## 2017-11-25 DIAGNOSIS — Z888 Allergy status to other drugs, medicaments and biological substances status: Secondary | ICD-10-CM | POA: Diagnosis not present

## 2017-11-25 DIAGNOSIS — Z91013 Allergy to seafood: Secondary | ICD-10-CM | POA: Diagnosis not present

## 2017-11-26 DIAGNOSIS — E119 Type 2 diabetes mellitus without complications: Secondary | ICD-10-CM | POA: Diagnosis not present

## 2017-11-27 ENCOUNTER — Ambulatory Visit (INDEPENDENT_AMBULATORY_CARE_PROVIDER_SITE_OTHER): Payer: Medicare HMO | Admitting: Family Medicine

## 2017-11-27 ENCOUNTER — Other Ambulatory Visit: Payer: Self-pay

## 2017-11-27 ENCOUNTER — Encounter: Payer: Self-pay | Admitting: Family Medicine

## 2017-11-27 VITALS — BP 105/60 | HR 72 | Temp 97.7°F | Wt 259.0 lb

## 2017-11-27 DIAGNOSIS — E782 Mixed hyperlipidemia: Secondary | ICD-10-CM | POA: Diagnosis not present

## 2017-11-27 DIAGNOSIS — I10 Essential (primary) hypertension: Secondary | ICD-10-CM

## 2017-11-27 DIAGNOSIS — E1142 Type 2 diabetes mellitus with diabetic polyneuropathy: Secondary | ICD-10-CM | POA: Diagnosis not present

## 2017-11-27 DIAGNOSIS — E119 Type 2 diabetes mellitus without complications: Secondary | ICD-10-CM | POA: Diagnosis not present

## 2017-11-27 LAB — POCT GLYCOSYLATED HEMOGLOBIN (HGB A1C): HBA1C, POC (CONTROLLED DIABETIC RANGE): 8.5 % — AB (ref 0.0–7.0)

## 2017-11-27 NOTE — Assessment & Plan Note (Signed)
Blood pressure at today's visit 105/60.  Patient currently taking carvedilol 25 mg, losartan 50 mg, and amlodipine 10 mg. -Decrease amlodipine to 5 mg once daily. -Follow-up blood pressure reading at next appointment. -If blood pressure at next appointment less than 120/80 then can discontinue amlodipine.

## 2017-11-27 NOTE — Patient Instructions (Addendum)
Thank you for coming in to see [place] today! Please see below to review our plan for today's visit:  1. Cut Amlodipine dose in 1/2 --> 5mg  once daily 2. Please keep taking your other medications as prescribed. 3. Keep moving and exercising to increase HDL (was 29 last time, would like to be 40+). 4. Please get a flu shot at next visit!  Please call the clinic at [phone] if your symptoms worsen or you have any concerns. It was our pleasure to serve you!  Dr. Milus Banister [facility]

## 2017-11-27 NOTE — Assessment & Plan Note (Signed)
Most recent lipid panel from June 2019 shows HDL low at 29.  Other values within normal limits. -Patient instructed to exercise to improve HDL. -Patient enrolled in Silver sneakers program and pursuing weight loss.

## 2017-11-27 NOTE — Assessment & Plan Note (Signed)
Neuropathy stable, patient continues having feeling of "pins-and-needles" in hands and feet. -No additional treatment at this time as symptoms are tolerable. -Monitor for future signs of change

## 2017-11-27 NOTE — Progress Notes (Signed)
Subjective:    Patient ID: Kathryn Crosby, female    DOB: [birth year], 57 y.o.   MRN: [id number]   CC: A1c check  HPI: Patient presents for diabetes and weight checkup.  She reports no new concerns today.  Diabetes: Taking Metformin 7741 BID, Trulicity 1.5mg  once weekly, Jardiance 10 daily, Novolog 3 units qAC, and Lantus 20 units qAM.  A1c 8.5 at today's visit, stable. Patient continues to experience neuropathy in her hands and feet but these are stable.  Obesity: Patient's previous BMI was 40.3, but is 39.4 at today's visit.  Patient has lost 7 pounds intentionally and is in an exercise program to improve her health.  She states exercise is difficult because she needs a knee replacement, which is scheduled for December 2019.  HTN: BP 105/60 today, taking Carvedilol 25, Losartan 50 and Amlodipine 10.  Denies feeling lightheaded or dizzy at today's visit.  Hyperlipidemia: taking Lipitor 40, last Lipid Panel showed HDL low at 29.  Patient reports no problem with medication.  Patient due for flu shot but declined.   Smoking status reviewed: Never smoker.  Review of Systems  Constitutional: Positive for weight loss (Intentional).  HENT: Negative for hearing loss.   Eyes: Negative for blurred vision.  Respiratory: Negative for shortness of breath.   Cardiovascular: Positive for leg swelling (Bilateral lower extremity). Negative for chest pain, palpitations and claudication.  Gastrointestinal: Negative for abdominal pain, diarrhea, nausea and vomiting.    Objective:  BP 105/60   Pulse 72   Temp 97.7 F (36.5 C) (Oral)   Wt 259 lb (117.5 kg)   SpO2 93%   BMI 39.38 kg/m  Vitals and nursing note reviewed  Physical Exam  Constitutional: She is oriented to person, place, and time. She appears well-developed and well-nourished. No distress.  HENT:  Right Ear: External ear normal.  Left Ear: External ear normal.  Nose: Nose normal.  Mouth/Throat: Oropharynx is clear and moist.    Eyes: Pupils are equal, round, and reactive to light. EOM are normal.  Neck: Normal range of motion.  Cardiovascular: Normal rate, regular rhythm, normal heart sounds and intact distal pulses.  Pulmonary/Chest: Effort normal and breath sounds normal.  Abdominal: Soft. Bowel sounds are normal.  Obese abdomen  Musculoskeletal: Normal range of motion. She exhibits edema (Bilateral lower extremity 2+ edema).  No ulcerations or lesions to feet bilaterally  Neurological: She is alert and oriented to person, place, and time.  Skin: Skin is warm. Capillary refill takes less than 2 seconds.  Psychiatric: She has a normal mood and affect. Her behavior is normal. Thought content normal.   Assessment & Plan:   Morbid obesity ([place]) -Patient has experienced 7 pound weight loss since June 2019 visit. -Nice improvement with diet and exercise. -Patient enrolled in Silver sneakers and other exercise programs to lose weight.  HTN (hypertension) Blood pressure at today's visit 105/60.  Patient currently taking carvedilol 25 mg, losartan 50 mg, and amlodipine 10 mg. -Decrease amlodipine to 5 mg once daily. -Follow-up blood pressure reading at next appointment. -If blood pressure at next appointment less than 120/80 then can discontinue amlodipine.  Diabetic neuropathy Neuropathy stable, patient continues having feeling of "pins-and-needles" in hands and feet. -No additional treatment at this time as symptoms are tolerable. -Monitor for future signs of change  DM (diabetes mellitus), type 2 ([place]) HbA1c at today's visit 8.5, stable from previous visit 5 months prior. -Continue taking metformin 1000 mg twice daily, Trulicity 1.5 mg once weekly, Vania Rea  10 mg daily, NovoLog 3 units q. before meals, and Lantus 20 units every morning.  Hyperlipidemia Most recent lipid panel from June 2019 shows HDL low at 29.  Other values within normal limits. -Patient instructed to exercise to improve HDL. -Patient  enrolled in Silver sneakers program and pursuing weight loss.   No follow-ups on file.   Dr. Milus Banister [facility], PGY-1

## 2017-11-27 NOTE — Assessment & Plan Note (Addendum)
-  Patient has experienced 7 pound weight loss since June 2019 visit. -Nice improvement with diet and exercise. -Patient enrolled in Silver sneakers and other exercise programs to lose weight.

## 2017-11-27 NOTE — Assessment & Plan Note (Signed)
HbA1c at today's visit 8.5, stable from previous visit 5 months prior. -Continue taking metformin 1000 mg twice daily, Trulicity 1.5 mg once weekly, Jardiance 10 mg daily, NovoLog 3 units q. before meals, and Lantus 20 units every morning.

## 2017-11-28 DIAGNOSIS — E119 Type 2 diabetes mellitus without complications: Secondary | ICD-10-CM | POA: Diagnosis not present

## 2017-11-29 DIAGNOSIS — E119 Type 2 diabetes mellitus without complications: Secondary | ICD-10-CM | POA: Diagnosis not present

## 2017-11-29 DIAGNOSIS — G4733 Obstructive sleep apnea (adult) (pediatric): Secondary | ICD-10-CM | POA: Diagnosis not present

## 2017-11-30 DIAGNOSIS — E119 Type 2 diabetes mellitus without complications: Secondary | ICD-10-CM | POA: Diagnosis not present

## 2017-12-01 DIAGNOSIS — E119 Type 2 diabetes mellitus without complications: Secondary | ICD-10-CM | POA: Diagnosis not present

## 2017-12-02 DIAGNOSIS — E119 Type 2 diabetes mellitus without complications: Secondary | ICD-10-CM | POA: Diagnosis not present

## 2017-12-03 DIAGNOSIS — E119 Type 2 diabetes mellitus without complications: Secondary | ICD-10-CM | POA: Diagnosis not present

## 2017-12-04 DIAGNOSIS — E119 Type 2 diabetes mellitus without complications: Secondary | ICD-10-CM | POA: Diagnosis not present

## 2017-12-05 ENCOUNTER — Telehealth: Payer: Self-pay | Admitting: Family Medicine

## 2017-12-05 DIAGNOSIS — E119 Type 2 diabetes mellitus without complications: Secondary | ICD-10-CM | POA: Diagnosis not present

## 2017-12-05 NOTE — Telephone Encounter (Signed)
Spoke with pt reminding them of/confirming their appt for tomorrow 12/06/2017. -CH °

## 2017-12-06 ENCOUNTER — Ambulatory Visit (INDEPENDENT_AMBULATORY_CARE_PROVIDER_SITE_OTHER): Payer: Medicare HMO

## 2017-12-06 VITALS — BP 118/82 | HR 71 | Temp 97.9°F | Ht 68.0 in | Wt 266.4 lb

## 2017-12-06 DIAGNOSIS — E119 Type 2 diabetes mellitus without complications: Secondary | ICD-10-CM | POA: Diagnosis not present

## 2017-12-06 DIAGNOSIS — Z0000 Encounter for general adult medical examination without abnormal findings: Secondary | ICD-10-CM | POA: Diagnosis not present

## 2017-12-06 NOTE — Patient Instructions (Signed)
Fall Prevention in the Home Falls can cause injuries. They can happen to people of all ages. There are many things you can do to make your home safe and to help prevent falls. What can I do on the outside of my home?  Regularly fix the edges of walkways and driveways and fix any cracks.  Remove anything that might make you trip as you walk through a door, such as a raised step or threshold.  Trim any bushes or trees on the path to your home.  Use bright outdoor lighting.  Clear any walking paths of anything that might make someone trip, such as rocks or tools.  Regularly check to see if handrails are loose or broken. Make sure that both sides of any steps have handrails.  Any raised decks and porches should have guardrails on the edges.  Have any leaves, snow, or ice cleared regularly.  Use sand or salt on walking paths during winter.  Clean up any spills in your garage right away. This includes oil or grease spills. What can I do in the bathroom?  Use night lights.  Install grab bars by the toilet and in the tub and shower. Do not use towel bars as grab bars.  Use non-skid mats or decals in the tub or shower.  If you need to sit down in the shower, use a plastic, non-slip stool.  Keep the floor dry. Clean up any water that spills on the floor as soon as it happens.  Remove soap buildup in the tub or shower regularly.  Attach bath mats securely with double-sided non-slip rug tape.  Do not have throw rugs and other things on the floor that can make you trip. What can I do in the bedroom?  Use night lights.  Make sure that you have a light by your bed that is easy to reach.  Do not use any sheets or blankets that are too big for your bed. They should not hang down onto the floor.  Have a firm chair that has side arms. You can use this for support while you get dressed.  Do not have throw rugs and other things on the floor that can make you trip. What can I do in  the kitchen?  Clean up any spills right away.  Avoid walking on wet floors.  Keep items that you use a lot in easy-to-reach places.  If you need to reach something above you, use a strong step stool that has a grab bar.  Keep electrical cords out of the way.  Do not use floor polish or wax that makes floors slippery. If you must use wax, use non-skid floor wax.  Do not have throw rugs and other things on the floor that can make you trip. What can I do with my stairs?  Do not leave any items on the stairs.  Make sure that there are handrails on both sides of the stairs and use them. Fix handrails that are broken or loose. Make sure that handrails are as long as the stairways.  Check any carpeting to make sure that it is firmly attached to the stairs. Fix any carpet that is loose or worn.  Avoid having throw rugs at the top or bottom of the stairs. If you do have throw rugs, attach them to the floor with carpet tape.  Make sure that you have a light switch at the top of the stairs and the bottom of the stairs. If you  do not have them, ask someone to add them for you. What else can I do to help prevent falls?  Wear shoes that: ? Do not have high heels. ? Have rubber bottoms. ? Are comfortable and fit you well. ? Are closed at the toe. Do not wear sandals.  If you use a stepladder: ? Make sure that it is fully opened. Do not climb a closed stepladder. ? Make sure that both sides of the stepladder are locked into place. ? Ask someone to hold it for you, if possible.  Clearly mark and make sure that you can see: ? Any grab bars or handrails. ? First and last steps. ? Where the edge of each step is.  Use tools that help you move around (mobility aids) if they are needed. These include: ? Canes. ? Walkers. ? Scooters. ? Crutches.  Turn on the lights when you go into a dark area. Replace any light bulbs as soon as they burn out.  Set up your furniture so you have a clear  path. Avoid moving your furniture around.  If any of your floors are uneven, fix them.  If there are any pets around you, be aware of where they are.  Review your medicines with your doctor. Some medicines can make you feel dizzy. This can increase your chance of falling. Ask your doctor what other things that you can do to help prevent falls. This information is not intended to replace advice given to you by your health care provider. Make sure you discuss any questions you have with your health care provider. Document Released: 11/13/2008 Document Revised: 06/25/2015 Document Reviewed: 02/21/2014 Elsevier Interactive Patient Education  2018 [employer]  Health Maintenance, Female Adopting a healthy lifestyle and getting preventive care can go a long way to promote health and wellness. Talk with your health care provider about what schedule of regular examinations is right for you. This is a good chance for you to check in with your provider about disease prevention and staying healthy. In between checkups, there are plenty of things you can do on your own. Experts have done a lot of research about which lifestyle changes and preventive measures are most likely to keep you healthy. Ask your health care provider for more information. Weight and diet Eat a healthy diet  Be sure to include plenty of vegetables, fruits, low-fat dairy products, and lean protein.  Do not eat a lot of foods high in solid fats, added sugars, or salt.  Get regular exercise. This is one of the most important things you can do for your health. ? Most adults should exercise for at least 150 minutes each week. The exercise should increase your heart rate and make you sweat (moderate-intensity exercise). ? Most adults should also do strengthening exercises at least twice a week. This is in addition to the moderate-intensity exercise.  Maintain a healthy weight  Body mass index (BMI) is a measurement that can be  used to identify possible weight problems. It estimates body fat based on height and weight. Your health care provider can help determine your BMI and help you achieve or maintain a healthy weight.  For females 20 years of age and older: ? A BMI below 18.5 is considered underweight. ? A BMI of 18.5 to 24.9 is normal. ? A BMI of 25 to 29.9 is considered overweight. ? A BMI of 30 and above is considered obese.  Watch levels of cholesterol and blood lipids  You should   start having your blood tested for lipids and cholesterol at 59 years of age, then have this test every 5 years.  You may need to have your cholesterol levels checked more often if: ? Your lipid or cholesterol levels are high. ? You are older than 59 years of age. ? You are at high risk for heart disease.  Cancer screening Lung Cancer  Lung cancer screening is recommended for adults 55-80 years old who are at high risk for lung cancer because of a history of smoking.  A yearly low-dose CT scan of the lungs is recommended for people who: ? Currently smoke. ? Have quit within the past 15 years. ? Have at least a 30-pack-year history of smoking. A pack year is smoking an average of one pack of cigarettes a day for 1 year.  Yearly screening should continue until it has been 15 years since you quit.  Yearly screening should stop if you develop a health problem that would prevent you from having lung cancer treatment.  Breast Cancer  Practice breast self-awareness. This means understanding how your breasts normally appear and feel.  It also means doing regular breast self-exams. Let your health care provider know about any changes, no matter how small.  If you are in your 20s or 30s, you should have a clinical breast exam (CBE) by a health care provider every 1-3 years as part of a regular health exam.  If you are 40 or older, have a CBE every year. Also consider having a breast X-ray (mammogram) every year.  If you have  a family history of breast cancer, talk to your health care provider about genetic screening.  If you are at high risk for breast cancer, talk to your health care provider about having an MRI and a mammogram every year.  Breast cancer gene (BRCA) assessment is recommended for women who have family members with BRCA-related cancers. BRCA-related cancers include: ? Breast. ? Ovarian. ? Tubal. ? Peritoneal cancers.  Results of the assessment will determine the need for genetic counseling and BRCA1 and BRCA2 testing.  Cervical Cancer Your health care provider may recommend that you be screened regularly for cancer of the pelvic organs (ovaries, uterus, and vagina). This screening involves a pelvic examination, including checking for microscopic changes to the surface of your cervix (Pap test). You may be encouraged to have this screening done every 3 years, beginning at age 21.  For women ages 30-65, health care providers may recommend pelvic exams and Pap testing every 3 years, or they may recommend the Pap and pelvic exam, combined with testing for human papilloma virus (HPV), every 5 years. Some types of HPV increase your risk of cervical cancer. Testing for HPV may also be done on women of any age with unclear Pap test results.  Other health care providers may not recommend any screening for nonpregnant women who are considered low risk for pelvic cancer and who do not have symptoms. Ask your health care provider if a screening pelvic exam is right for you.  If you have had past treatment for cervical cancer or a condition that could lead to cancer, you need Pap tests and screening for cancer for at least 20 years after your treatment. If Pap tests have been discontinued, your risk factors (such as having a new sexual partner) need to be reassessed to determine if screening should resume. Some women have medical problems that increase the chance of getting cervical cancer. In these cases, your    health care provider may recommend more frequent screening and Pap tests.  Colorectal Cancer  This type of cancer can be detected and often prevented.  Routine colorectal cancer screening usually begins at 59 years of age and continues through 59 years of age.  Your health care provider may recommend screening at an earlier age if you have risk factors for colon cancer.  Your health care provider may also recommend using home test kits to check for hidden blood in the stool.  A small camera at the end of a tube can be used to examine your colon directly (sigmoidoscopy or colonoscopy). This is done to check for the earliest forms of colorectal cancer.  Routine screening usually begins at age 50.  Direct examination of the colon should be repeated every 5-10 years through 59 years of age. However, you may need to be screened more often if early forms of precancerous polyps or small growths are found.  Skin Cancer  Check your skin from head to toe regularly.  Tell your health care provider about any new moles or changes in moles, especially if there is a change in a mole's shape or color.  Also tell your health care provider if you have a mole that is larger than the size of a pencil eraser.  Always use sunscreen. Apply sunscreen liberally and repeatedly throughout the day.  Protect yourself by wearing long sleeves, pants, a wide-brimmed hat, and sunglasses whenever you are outside.  Heart disease, diabetes, and high blood pressure  High blood pressure causes heart disease and increases the risk of stroke. High blood pressure is more likely to develop in: ? People who have blood pressure in the high end of the normal range (130-139/85-89 mm Hg). ? People who are overweight or obese. ? People who are African American.  If you are 18-39 years of age, have your blood pressure checked every 3-5 years. If you are 40 years of age or older, have your blood pressure checked every year. You  should have your blood pressure measured twice-once when you are at a hospital or clinic, and once when you are not at a hospital or clinic. Record the average of the two measurements. To check your blood pressure when you are not at a hospital or clinic, you can use: ? An automated blood pressure machine at a pharmacy. ? A home blood pressure monitor.  If you are between 55 years and 79 years old, ask your health care provider if you should take aspirin to prevent strokes.  Have regular diabetes screenings. This involves taking a blood sample to check your fasting blood sugar level. ? If you are at a normal weight and have a low risk for diabetes, have this test once every three years after 59 years of age. ? If you are overweight and have a high risk for diabetes, consider being tested at a younger age or more often. Preventing infection Hepatitis B  If you have a higher risk for hepatitis B, you should be screened for this virus. You are considered at high risk for hepatitis B if: ? You were born in a country where hepatitis B is common. Ask your health care provider which countries are considered high risk. ? Your parents were born in a high-risk country, and you have not been immunized against hepatitis B (hepatitis B vaccine). ? You have HIV or AIDS. ? You use needles to inject street drugs. ? You live with someone who has hepatitis B. ?   You have had sex with someone who has hepatitis B. ? You get hemodialysis treatment. ? You take certain medicines for conditions, including cancer, organ transplantation, and autoimmune conditions.  Hepatitis C  Blood testing is recommended for: ? Everyone born from 1945 through 1965. ? Anyone with known risk factors for hepatitis C.  Sexually transmitted infections (STIs)  You should be screened for sexually transmitted infections (STIs) including gonorrhea and chlamydia if: ? You are sexually active and are younger than 59 years of age. ? You  are older than 59 years of age and your health care provider tells you that you are at risk for this type of infection. ? Your sexual activity has changed since you were last screened and you are at an increased risk for chlamydia or gonorrhea. Ask your health care provider if you are at risk.  If you do not have HIV, but are at risk, it may be recommended that you take a prescription medicine daily to prevent HIV infection. This is called pre-exposure prophylaxis (PrEP). You are considered at risk if: ? You are sexually active and do not regularly use condoms or know the HIV status of your partner(s). ? You take drugs by injection. ? You are sexually active with a partner who has HIV.  Talk with your health care provider about whether you are at high risk of being infected with HIV. If you choose to begin PrEP, you should first be tested for HIV. You should then be tested every 3 months for as long as you are taking PrEP. Pregnancy  If you are premenopausal and you may become pregnant, ask your health care provider about preconception counseling.  If you may become pregnant, take 400 to 800 micrograms (mcg) of folic acid every day.  If you want to prevent pregnancy, talk to your health care provider about birth control (contraception). Osteoporosis and menopause  Osteoporosis is a disease in which the bones lose minerals and strength with aging. This can result in serious bone fractures. Your risk for osteoporosis can be identified using a bone density scan.  If you are 65 years of age or older, or if you are at risk for osteoporosis and fractures, ask your health care provider if you should be screened.  Ask your health care provider whether you should take a calcium or vitamin D supplement to lower your risk for osteoporosis.  Menopause may have certain physical symptoms and risks.  Hormone replacement therapy may reduce some of these symptoms and risks. Talk to your health care  provider about whether hormone replacement therapy is right for you. Follow these instructions at home:  Schedule regular health, dental, and eye exams.  Stay current with your immunizations.  Do not use any tobacco products including cigarettes, chewing tobacco, or electronic cigarettes.  If you are pregnant, do not drink alcohol.  If you are breastfeeding, limit how much and how often you drink alcohol.  Limit alcohol intake to no more than 1 drink per day for nonpregnant women. One drink equals 12 ounces of beer, 5 ounces of wine, or 1 ounces of hard liquor.  Do not use street drugs.  Do not share needles.  Ask your health care provider for help if you need support or information about quitting drugs.  Tell your health care provider if you often feel depressed.  Tell your health care provider if you have ever been abused or do not feel safe at home. This information is not intended to replace   advice given to you by your health care provider. Make sure you discuss any questions you have with your health care provider. Document Released: 08/02/2010 Document Revised: 06/25/2015 Document Reviewed: 10/21/2014 Elsevier Interactive Patient Education  2018 [employer]  

## 2017-12-06 NOTE — Progress Notes (Signed)
Subjective:   Yaris Ferrell is a 57 y.o. female who presents for Medicare Annual (Subsequent) preventive examination.  Review of Systems:  Physical assessment deferred to PCP.  Cardiac Risk Factors include: diabetes mellitus;dyslipidemia;hypertension;obesity (BMI >30kg/m2)     Objective:    Vitals: BP 118/82   Pulse 71   Temp 97.9 F (36.6 C) (Oral)   Ht _0  (1.727 m)   Wt 266 lb 6.4 oz (120.8 kg)   SpO2 98%   BMI 40.51 kg/m   Body mass index is 40.51 kg/m.  Advanced Directives 12/06/2017 07/17/2017 06/28/2017 01/16/2017 08/04/2016 07/22/2016 06/20/2016  Does Patient Have a Medical Advance Directive? _1  No No  Would patient like information on creating a medical advance directive? No - Patient declined No - Patient declined No - Patient declined No - Patient declined No - Patient declined No - Patient declined No - Patient declined    Tobacco Social History   Tobacco Use  Smoking Status Never Smoker  Smokeless Tobacco Never Used  Tobacco Comment   no plans to start     Counseling given: Not Answered Comment: no plans to start  Clinical Intake:  Pre-visit preparation completed: Yes  Pain : 0-10 Pain Score: 10-Worst pain ever Pain Location: Knee Pain Orientation: Left Pain Descriptors / Indicators: Aching, Numbness, Tingling, Cramping Pain Onset: More than a month ago Pain Frequency: Constant Pain Relieving Factors: meds don't really help Effect of Pain on Daily Activities: makes herself do what she has to do  Pain Relieving Factors: meds don't really help  Nutritional Status: BMI > 30  Obese Nutritional Risks: None Diabetes: Yes CBG done?: No Did pt. bring in CBG monitor from home?: No   CBG at home this AM 130  How often do you need to have someone help you when you read instructions, pamphlets, or other written materials from your doctor or pharmacy?: 1 - Never What is the last grade level you completed in school?: 12th  grade  Interpreter Needed?: No    Past Medical History:  Diagnosis Date  . Arthritis   . Chronic headaches   . Diabetes mellitus   . GERD (gastroesophageal reflux disease)   . Hyperlipidemia   . Hypertension   . Kidney stones   . Migraine   . Nephrolithiasis   . Varicose veins 03-30-15   Bilateral Leg   Past Surgical History:  Procedure Laterality Date  . ABDOMINAL HYSTERECTOMY    . BREAST BIOPSY Bilateral    benign  . left ankle fracture    . LITHOTRIPSY    . TONSILLECTOMY     Family History  Problem Relation Age of Onset  . Diabetes Mother   . Hypertension Mother   . Kidney disease Mother   . Varicose Veins Mother   . Varicose Veins Father   . Coronary artery disease Brother        CABG age 41  . Diabetes Sister   . Arthritis Sister    Social History   Socioeconomic History  . Marital status: Single    Spouse name: Not on file  . Number of children: 3  . Years of education: 12th  . Highest education level: 12th grade  Occupational History  . 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 Financial resource strain: Not very hard  . Food insecurity:    Worry: Never true    Inability: Never true  .  Transportation needs:    Medical: No    Non-medical: No  Tobacco Use  . Smoking status: Never Smoker  . Smokeless tobacco: Never Used  . Tobacco comment: no plans to start  Substance and Sexual Activity  . Alcohol use: No    Alcohol/week: 0.0 standard drinks  . Drug use: No  . Sexual activity: Not Currently    Partners: Male    Birth control/protection: Surgical    Comment: hysterectomy  Lifestyle  . Physical activity:    Days per week: 0 days    Minutes per session: 0 min  . Stress: Not at all  Relationships  . Social connections:    Talks on phone: More than three times a week    Gets together: Three times a week    Attends religious service: More than 4 times per year    Active member of club or  organization: No    Attends meetings of clubs or organizations: Never    Relationship status: Never married  Other Topics Concern  . Not on file  Social History Narrative   Lives with a friend. Lives in apartment, has 4 steps with handrails, no grab bars in bathroom. No throw rugs on floor. Has smoke alarms.    No pets.   Likes to go to casino, likes to read newspapers, Bible, books.       Likes to eat wide variety of food, likes to drink sweet tea, occasional soda, juices. Mostly water.       Wears seat belt in car.        Outpatient Encounter Medications as of 12/06/2017  Medication Sig  . ACCU-CHEK SOFTCLIX LANCETS lancets Use to check blood sugar twice daily.  Dx code: E11.9, E11.40  . Alcohol Swabs PADS Use to check blood sugar twice daily or as directed.  Marland Kitchen atorvastatin (LIPITOR) 40 MG tablet TAKE 1 TABLET AT BEDTIME  . B-D ULTRAFINE III SHORT PEN 31G X 8 MM MISC USE WITH INSULIN PEN  . Blood Glucose Calibration (ACCU-CHEK AVIVA) SOLN Use as directed.  . Blood Glucose Monitoring Suppl (ACCU-CHEK AVIVA PLUS) w/Device KIT USE AS DIRECTED  TO TEST BLOOD SUGAR TWICE DAILY  . carvedilol (COREG) 25 MG tablet TAKE 1 TABLET TWICE DAILY  WITH  MEALS.  Marland Kitchen empagliflozin (JARDIANCE) 10 MG TABS tablet Take 10 mg by mouth daily.  . fluconazole (DIFLUCAN) 150 MG tablet TAKE 1 TABLET  ONCE  A  DAY FOR ONE DOSE  . glucose blood (ACCU-CHEK AVIVA PLUS) test strip Use to check blood sugar twice daily.  Dx code: E11.9, E11.40  . insulin aspart (NOVOLOG FLEXPEN) 100 UNIT/ML FlexPen Inject 3 Units into the skin 3 (three) times daily with meals.  . Insulin Glargine (LANTUS) 100 UNIT/ML Solostar Pen Inject 20 Units into the skin daily.  Elmore Guise Device MISC To be used with accu-check lancets  . Lancets Misc. (ACCU-CHEK FASTCLIX LANCET) KIT Please use to check blood sugar  . losartan (COZAAR) 50 MG tablet TAKE 1 TABLET EVERY DAY  . metFORMIN (GLUCOPHAGE) 1000 MG tablet TAKE 1 TABLET TWICE DAILY WITH A  MEAL  . omeprazole (PRILOSEC) 20 MG capsule TAKE 1 CAPSULE EVERY DAY  . Potassium Citrate 15 MEQ (1620 MG) TBCR Take 1 tablet by mouth 3 (three) times daily.  . TRULICITY 1.5 ZO/1.0RU SOPN INJECT 1.5 MG SUBCUTANEOUSLY ONE TIME WEEKLY  . amLODipine (NORVASC) 10 MG tablet TAKE 1 TABLET EVERY DAY  . [DISCONTINUED] fluconazole (DIFLUCAN) 200 MG tablet TAKE 1  TABLET EVERY OTHER DAY   No facility-administered encounter medications on file as of 12/06/2017.     Activities of Daily Living In your present state of health, do you have any difficulty performing the following activities: 12/06/2017  Hearing? Y  Comment states difficulty in hearing but audiology exam normal  Vision? N  Comment reading glasses  Difficulty concentrating or making decisions? N  Walking or climbing stairs? Y  Dressing or bathing? N  Doing errands, shopping? N  Preparing Food and eating ? N  Using the Toilet? Y  In the past six months, have you accidently leaked urine? N  Do you have problems with loss of bowel control? N  Managing your Medications? N  Managing your Finances? N  Housekeeping or managing your Housekeeping? N  Some recent data might be hidden    Patient Care Team: Daisy Floro, DO as PCP - General Kennith Center, RD as Dietitian (Family Medicine) Dickie La, MD as Consulting Physician (Family Medicine) Shelly Bombard, MD as Consulting Physician (Obstetrics and Gynecology) Warden Fillers, MD as Consulting Physician (Ophthalmology) Jaci Standard, MD as Referring Physician (Orthopedic Surgery)    Assessment:   This is a routine wellness examination for Charrise.  Exercise Activities and Dietary recommendations Current Exercise Habits: The patient does not participate in regular exercise at present  Goals    . HEMOGLOBIN A1C < 8    . Weight < 200 lb (90.719 kg)       Fall Risk Fall Risk  12/06/2017 11/27/2017 07/17/2017 06/28/2017 01/16/2017  Falls in the past year? 1 Yes No No No    Number falls in past yr: 1 1 - - -  Injury with Fall? 0 No - - -  Comment - - - - -  Risk for fall due to : Impaired mobility - - - -  Follow up Education provided;Falls prevention discussed Falls prevention discussed - - -   Is the patient's home free of loose throw rugs in walkways, pet beds, electrical cords, etc?   yes      Grab bars in the bathroom? no      Handrails on the stairs?   yes      Adequate lighting?   yes  Depression Screen PHQ 2/9 Scores 12/06/2017 11/27/2017 07/17/2017 06/28/2017  PHQ - 2 Score 0 0 0 0  Exception Documentation - - - -     Cognitive Function MMSE - Mini Mental State Exam 12/06/2017  Orientation to time 5  Orientation to Place 5  Registration 3  Attention/ Calculation 5  Recall 3  Language- name 2 objects 2  Language- repeat 1  Language- follow 3 step command 3  Language- read & follow direction 1  Write a sentence 1  Copy design 1  Total score 30     6CIT Screen 12/06/2017  What Year? 0 points  What month? 0 points  What time? 0 points  Count back from 20 0 points  Months in reverse 0 points  Repeat phrase 0 points  Total Score 0    Immunization History  Administered Date(s) Administered  . Influenza,inj,Quad PF,6+ Mos 11/19/2012, 10/18/2013, 01/06/2016  . Influenza,inj,quad, With Preservative 11/22/2016  . Pneumococcal Polysaccharide-23 05/20/2014     Screening Tests Health Maintenance  Topic Date Due  . INFLUENZA VACCINE  02/10/2018 (Originally 08/31/2017)  . FOOT EXAM  02/27/2018  . HEMOGLOBIN A1C  02/27/2018  . OPHTHALMOLOGY EXAM  09/01/2018  . MAMMOGRAM  09/15/2018  . COLONOSCOPY  05/24/2023  . TETANUS/TDAP  05/04/2024  . PNEUMOCOCCAL POLYSACCHARIDE VACCINE AGE 7-64 HIGH RISK  Completed  . Hepatitis C Screening  Completed  . HIV Screening  Completed    Cancer Screenings: Lung: Low Dose CT Chest recommended if Age 60-80 years, 30 pack-year currently smoking OR have quit w/in 15years. Patient does not  qualify. Breast:  Up to date on Mammogram? Yes   Up to date of Bone Density/Dexa? Yes Colorectal: up to date   Additional Screenings:  Hepatitis C Screening: complete     Plan:  Patient decline flu vaccine today but states she will get it before TKR in December. States had eye exam with Dr. Katy Fitch about 3 months ago. Will call for report.   Patient would like PCP to enter a referral to podiatry for a diabetic foot exam and possible diabetic shoes.  Patient would also like a get a bedside commode and a rollator walker for after her surgery. Will send note to PCP.   I have personally reviewed and noted the following in the patient's chart:   . Medical and social history . Use of alcohol, tobacco or illicit drugs  . Current medications and supplements . Functional ability and status . Nutritional status . Physical activity . Advanced directives . List of other physicians . Hospitalizations, surgeries, and ER visits in previous 12 months . Vitals . Screenings to include cognitive, depression, and falls . Referrals and appointments  In addition, I have reviewed and discussed with patient certain preventive protocols, quality metrics, and best practice recommendations. A written personalized care plan for preventive services as well as general preventive health recommendations were provided to patient.     Esau Grew, RN  12/06/2017

## 2017-12-07 DIAGNOSIS — E119 Type 2 diabetes mellitus without complications: Secondary | ICD-10-CM | POA: Diagnosis not present

## 2017-12-08 DIAGNOSIS — E119 Type 2 diabetes mellitus without complications: Secondary | ICD-10-CM | POA: Diagnosis not present

## 2017-12-09 ENCOUNTER — Other Ambulatory Visit: Payer: Self-pay | Admitting: Internal Medicine

## 2017-12-09 DIAGNOSIS — E119 Type 2 diabetes mellitus without complications: Secondary | ICD-10-CM | POA: Diagnosis not present

## 2017-12-10 DIAGNOSIS — E119 Type 2 diabetes mellitus without complications: Secondary | ICD-10-CM | POA: Diagnosis not present

## 2017-12-11 ENCOUNTER — Other Ambulatory Visit: Payer: Self-pay | Admitting: Family Medicine

## 2017-12-11 ENCOUNTER — Other Ambulatory Visit: Payer: Self-pay | Admitting: Obstetrics

## 2017-12-11 DIAGNOSIS — E119 Type 2 diabetes mellitus without complications: Secondary | ICD-10-CM | POA: Diagnosis not present

## 2017-12-12 DIAGNOSIS — E119 Type 2 diabetes mellitus without complications: Secondary | ICD-10-CM | POA: Diagnosis not present

## 2017-12-13 DIAGNOSIS — E119 Type 2 diabetes mellitus without complications: Secondary | ICD-10-CM | POA: Diagnosis not present

## 2017-12-14 DIAGNOSIS — E119 Type 2 diabetes mellitus without complications: Secondary | ICD-10-CM | POA: Diagnosis not present

## 2017-12-15 DIAGNOSIS — E119 Type 2 diabetes mellitus without complications: Secondary | ICD-10-CM | POA: Diagnosis not present

## 2017-12-16 DIAGNOSIS — E119 Type 2 diabetes mellitus without complications: Secondary | ICD-10-CM | POA: Diagnosis not present

## 2017-12-17 DIAGNOSIS — E119 Type 2 diabetes mellitus without complications: Secondary | ICD-10-CM | POA: Diagnosis not present

## 2017-12-18 DIAGNOSIS — E119 Type 2 diabetes mellitus without complications: Secondary | ICD-10-CM | POA: Diagnosis not present

## 2017-12-19 DIAGNOSIS — M25562 Pain in left knee: Secondary | ICD-10-CM | POA: Diagnosis not present

## 2017-12-19 DIAGNOSIS — Z01812 Encounter for preprocedural laboratory examination: Secondary | ICD-10-CM | POA: Diagnosis not present

## 2017-12-19 DIAGNOSIS — E785 Hyperlipidemia, unspecified: Secondary | ICD-10-CM | POA: Diagnosis not present

## 2017-12-19 DIAGNOSIS — I10 Essential (primary) hypertension: Secondary | ICD-10-CM | POA: Diagnosis not present

## 2017-12-19 DIAGNOSIS — E119 Type 2 diabetes mellitus without complications: Secondary | ICD-10-CM | POA: Diagnosis not present

## 2017-12-19 DIAGNOSIS — Z79899 Other long term (current) drug therapy: Secondary | ICD-10-CM | POA: Diagnosis not present

## 2017-12-19 DIAGNOSIS — Z794 Long term (current) use of insulin: Secondary | ICD-10-CM | POA: Diagnosis not present

## 2017-12-20 DIAGNOSIS — E119 Type 2 diabetes mellitus without complications: Secondary | ICD-10-CM | POA: Diagnosis not present

## 2017-12-21 DIAGNOSIS — E119 Type 2 diabetes mellitus without complications: Secondary | ICD-10-CM | POA: Diagnosis not present

## 2017-12-22 ENCOUNTER — Ambulatory Visit (INDEPENDENT_AMBULATORY_CARE_PROVIDER_SITE_OTHER): Payer: Medicare HMO | Admitting: *Deleted

## 2017-12-22 DIAGNOSIS — E119 Type 2 diabetes mellitus without complications: Secondary | ICD-10-CM | POA: Diagnosis not present

## 2017-12-22 DIAGNOSIS — Z23 Encounter for immunization: Secondary | ICD-10-CM | POA: Diagnosis not present

## 2017-12-23 ENCOUNTER — Other Ambulatory Visit: Payer: Self-pay | Admitting: Obstetrics

## 2017-12-23 DIAGNOSIS — E119 Type 2 diabetes mellitus without complications: Secondary | ICD-10-CM | POA: Diagnosis not present

## 2017-12-24 DIAGNOSIS — E119 Type 2 diabetes mellitus without complications: Secondary | ICD-10-CM | POA: Diagnosis not present

## 2017-12-25 DIAGNOSIS — E119 Type 2 diabetes mellitus without complications: Secondary | ICD-10-CM | POA: Diagnosis not present

## 2017-12-26 DIAGNOSIS — E119 Type 2 diabetes mellitus without complications: Secondary | ICD-10-CM | POA: Diagnosis not present

## 2017-12-27 DIAGNOSIS — E119 Type 2 diabetes mellitus without complications: Secondary | ICD-10-CM | POA: Diagnosis not present

## 2017-12-28 DIAGNOSIS — E119 Type 2 diabetes mellitus without complications: Secondary | ICD-10-CM | POA: Diagnosis not present

## 2017-12-29 DIAGNOSIS — E119 Type 2 diabetes mellitus without complications: Secondary | ICD-10-CM | POA: Diagnosis not present

## 2017-12-30 DIAGNOSIS — E119 Type 2 diabetes mellitus without complications: Secondary | ICD-10-CM | POA: Diagnosis not present

## 2017-12-31 DIAGNOSIS — E119 Type 2 diabetes mellitus without complications: Secondary | ICD-10-CM | POA: Diagnosis not present

## 2018-01-04 DIAGNOSIS — M5416 Radiculopathy, lumbar region: Secondary | ICD-10-CM | POA: Diagnosis not present

## 2018-01-04 DIAGNOSIS — M5442 Lumbago with sciatica, left side: Secondary | ICD-10-CM | POA: Diagnosis not present

## 2018-01-04 DIAGNOSIS — R29898 Other symptoms and signs involving the musculoskeletal system: Secondary | ICD-10-CM | POA: Diagnosis not present

## 2018-01-08 DIAGNOSIS — E119 Type 2 diabetes mellitus without complications: Secondary | ICD-10-CM | POA: Diagnosis not present

## 2018-01-09 DIAGNOSIS — E119 Type 2 diabetes mellitus without complications: Secondary | ICD-10-CM | POA: Diagnosis not present

## 2018-01-10 DIAGNOSIS — Z6839 Body mass index (BMI) 39.0-39.9, adult: Secondary | ICD-10-CM | POA: Diagnosis not present

## 2018-01-10 DIAGNOSIS — E1149 Type 2 diabetes mellitus with other diabetic neurological complication: Secondary | ICD-10-CM | POA: Diagnosis not present

## 2018-01-10 DIAGNOSIS — E119 Type 2 diabetes mellitus without complications: Secondary | ICD-10-CM | POA: Diagnosis not present

## 2018-01-10 DIAGNOSIS — E1140 Type 2 diabetes mellitus with diabetic neuropathy, unspecified: Secondary | ICD-10-CM | POA: Diagnosis not present

## 2018-01-10 DIAGNOSIS — E7800 Pure hypercholesterolemia, unspecified: Secondary | ICD-10-CM | POA: Diagnosis not present

## 2018-01-10 DIAGNOSIS — R609 Edema, unspecified: Secondary | ICD-10-CM | POA: Diagnosis not present

## 2018-01-10 DIAGNOSIS — G4733 Obstructive sleep apnea (adult) (pediatric): Secondary | ICD-10-CM | POA: Diagnosis not present

## 2018-01-10 DIAGNOSIS — I872 Venous insufficiency (chronic) (peripheral): Secondary | ICD-10-CM | POA: Diagnosis not present

## 2018-01-10 DIAGNOSIS — J449 Chronic obstructive pulmonary disease, unspecified: Secondary | ICD-10-CM | POA: Diagnosis not present

## 2018-01-10 DIAGNOSIS — E113599 Type 2 diabetes mellitus with proliferative diabetic retinopathy without macular edema, unspecified eye: Secondary | ICD-10-CM | POA: Diagnosis not present

## 2018-01-10 DIAGNOSIS — I10 Essential (primary) hypertension: Secondary | ICD-10-CM | POA: Diagnosis not present

## 2018-01-11 DIAGNOSIS — M1712 Unilateral primary osteoarthritis, left knee: Secondary | ICD-10-CM | POA: Diagnosis not present

## 2018-01-11 DIAGNOSIS — E1149 Type 2 diabetes mellitus with other diabetic neurological complication: Secondary | ICD-10-CM | POA: Diagnosis not present

## 2018-01-11 DIAGNOSIS — G8918 Other acute postprocedural pain: Secondary | ICD-10-CM | POA: Diagnosis not present

## 2018-01-11 DIAGNOSIS — G4733 Obstructive sleep apnea (adult) (pediatric): Secondary | ICD-10-CM | POA: Diagnosis not present

## 2018-01-11 DIAGNOSIS — E119 Type 2 diabetes mellitus without complications: Secondary | ICD-10-CM | POA: Diagnosis not present

## 2018-01-11 DIAGNOSIS — E785 Hyperlipidemia, unspecified: Secondary | ICD-10-CM | POA: Diagnosis not present

## 2018-01-11 DIAGNOSIS — Z6841 Body Mass Index (BMI) 40.0 and over, adult: Secondary | ICD-10-CM | POA: Diagnosis not present

## 2018-01-11 DIAGNOSIS — E876 Hypokalemia: Secondary | ICD-10-CM | POA: Diagnosis not present

## 2018-01-11 DIAGNOSIS — K219 Gastro-esophageal reflux disease without esophagitis: Secondary | ICD-10-CM | POA: Diagnosis not present

## 2018-01-11 DIAGNOSIS — G8929 Other chronic pain: Secondary | ICD-10-CM | POA: Diagnosis not present

## 2018-01-11 DIAGNOSIS — M25562 Pain in left knee: Secondary | ICD-10-CM | POA: Diagnosis not present

## 2018-01-11 DIAGNOSIS — I10 Essential (primary) hypertension: Secondary | ICD-10-CM | POA: Diagnosis not present

## 2018-01-11 DIAGNOSIS — Z79899 Other long term (current) drug therapy: Secondary | ICD-10-CM | POA: Diagnosis not present

## 2018-01-11 DIAGNOSIS — Z9989 Dependence on other enabling machines and devices: Secondary | ICD-10-CM | POA: Diagnosis not present

## 2018-01-11 DIAGNOSIS — Z96652 Presence of left artificial knee joint: Secondary | ICD-10-CM | POA: Diagnosis not present

## 2018-01-11 DIAGNOSIS — E782 Mixed hyperlipidemia: Secondary | ICD-10-CM | POA: Diagnosis not present

## 2018-01-13 DIAGNOSIS — E119 Type 2 diabetes mellitus without complications: Secondary | ICD-10-CM | POA: Diagnosis not present

## 2018-01-14 DIAGNOSIS — Z471 Aftercare following joint replacement surgery: Secondary | ICD-10-CM | POA: Diagnosis not present

## 2018-01-14 DIAGNOSIS — E876 Hypokalemia: Secondary | ICD-10-CM | POA: Diagnosis not present

## 2018-01-14 DIAGNOSIS — I279 Pulmonary heart disease, unspecified: Secondary | ICD-10-CM | POA: Diagnosis not present

## 2018-01-14 DIAGNOSIS — M1711 Unilateral primary osteoarthritis, right knee: Secondary | ICD-10-CM | POA: Diagnosis not present

## 2018-01-14 DIAGNOSIS — I10 Essential (primary) hypertension: Secondary | ICD-10-CM | POA: Diagnosis not present

## 2018-01-14 DIAGNOSIS — E119 Type 2 diabetes mellitus without complications: Secondary | ICD-10-CM | POA: Diagnosis not present

## 2018-01-14 DIAGNOSIS — E1142 Type 2 diabetes mellitus with diabetic polyneuropathy: Secondary | ICD-10-CM | POA: Diagnosis not present

## 2018-01-15 DIAGNOSIS — E119 Type 2 diabetes mellitus without complications: Secondary | ICD-10-CM | POA: Diagnosis not present

## 2018-01-16 DIAGNOSIS — E1142 Type 2 diabetes mellitus with diabetic polyneuropathy: Secondary | ICD-10-CM | POA: Diagnosis not present

## 2018-01-16 DIAGNOSIS — E119 Type 2 diabetes mellitus without complications: Secondary | ICD-10-CM | POA: Diagnosis not present

## 2018-01-16 DIAGNOSIS — I279 Pulmonary heart disease, unspecified: Secondary | ICD-10-CM | POA: Diagnosis not present

## 2018-01-16 DIAGNOSIS — M1711 Unilateral primary osteoarthritis, right knee: Secondary | ICD-10-CM | POA: Diagnosis not present

## 2018-01-16 DIAGNOSIS — I10 Essential (primary) hypertension: Secondary | ICD-10-CM | POA: Diagnosis not present

## 2018-01-16 DIAGNOSIS — Z471 Aftercare following joint replacement surgery: Secondary | ICD-10-CM | POA: Diagnosis not present

## 2018-01-16 DIAGNOSIS — E876 Hypokalemia: Secondary | ICD-10-CM | POA: Diagnosis not present

## 2018-01-17 DIAGNOSIS — E119 Type 2 diabetes mellitus without complications: Secondary | ICD-10-CM | POA: Diagnosis not present

## 2018-01-17 DIAGNOSIS — Z96652 Presence of left artificial knee joint: Secondary | ICD-10-CM | POA: Diagnosis not present

## 2018-01-17 DIAGNOSIS — Z471 Aftercare following joint replacement surgery: Secondary | ICD-10-CM | POA: Diagnosis not present

## 2018-01-18 DIAGNOSIS — E119 Type 2 diabetes mellitus without complications: Secondary | ICD-10-CM | POA: Diagnosis not present

## 2018-01-18 DIAGNOSIS — I279 Pulmonary heart disease, unspecified: Secondary | ICD-10-CM | POA: Diagnosis not present

## 2018-01-18 DIAGNOSIS — E1142 Type 2 diabetes mellitus with diabetic polyneuropathy: Secondary | ICD-10-CM | POA: Diagnosis not present

## 2018-01-18 DIAGNOSIS — Z471 Aftercare following joint replacement surgery: Secondary | ICD-10-CM | POA: Diagnosis not present

## 2018-01-18 DIAGNOSIS — I10 Essential (primary) hypertension: Secondary | ICD-10-CM | POA: Diagnosis not present

## 2018-01-18 DIAGNOSIS — M1711 Unilateral primary osteoarthritis, right knee: Secondary | ICD-10-CM | POA: Diagnosis not present

## 2018-01-18 DIAGNOSIS — E876 Hypokalemia: Secondary | ICD-10-CM | POA: Diagnosis not present

## 2018-01-19 DIAGNOSIS — E119 Type 2 diabetes mellitus without complications: Secondary | ICD-10-CM | POA: Diagnosis not present

## 2018-01-20 DIAGNOSIS — E119 Type 2 diabetes mellitus without complications: Secondary | ICD-10-CM | POA: Diagnosis not present

## 2018-01-21 DIAGNOSIS — E119 Type 2 diabetes mellitus without complications: Secondary | ICD-10-CM | POA: Diagnosis not present

## 2018-01-22 ENCOUNTER — Other Ambulatory Visit: Payer: Self-pay | Admitting: Obstetrics

## 2018-01-22 DIAGNOSIS — E1142 Type 2 diabetes mellitus with diabetic polyneuropathy: Secondary | ICD-10-CM | POA: Diagnosis not present

## 2018-01-22 DIAGNOSIS — Z471 Aftercare following joint replacement surgery: Secondary | ICD-10-CM | POA: Diagnosis not present

## 2018-01-22 DIAGNOSIS — I279 Pulmonary heart disease, unspecified: Secondary | ICD-10-CM | POA: Diagnosis not present

## 2018-01-22 DIAGNOSIS — I10 Essential (primary) hypertension: Secondary | ICD-10-CM | POA: Diagnosis not present

## 2018-01-22 DIAGNOSIS — E876 Hypokalemia: Secondary | ICD-10-CM | POA: Diagnosis not present

## 2018-01-22 DIAGNOSIS — M1711 Unilateral primary osteoarthritis, right knee: Secondary | ICD-10-CM | POA: Diagnosis not present

## 2018-01-22 DIAGNOSIS — E119 Type 2 diabetes mellitus without complications: Secondary | ICD-10-CM | POA: Diagnosis not present

## 2018-01-23 DIAGNOSIS — E119 Type 2 diabetes mellitus without complications: Secondary | ICD-10-CM | POA: Diagnosis not present

## 2018-01-24 DIAGNOSIS — E119 Type 2 diabetes mellitus without complications: Secondary | ICD-10-CM | POA: Diagnosis not present

## 2018-01-25 DIAGNOSIS — E119 Type 2 diabetes mellitus without complications: Secondary | ICD-10-CM | POA: Diagnosis not present

## 2018-01-26 DIAGNOSIS — E876 Hypokalemia: Secondary | ICD-10-CM | POA: Diagnosis not present

## 2018-01-26 DIAGNOSIS — E119 Type 2 diabetes mellitus without complications: Secondary | ICD-10-CM | POA: Diagnosis not present

## 2018-01-26 DIAGNOSIS — I279 Pulmonary heart disease, unspecified: Secondary | ICD-10-CM | POA: Diagnosis not present

## 2018-01-26 DIAGNOSIS — I10 Essential (primary) hypertension: Secondary | ICD-10-CM | POA: Diagnosis not present

## 2018-01-26 DIAGNOSIS — Z471 Aftercare following joint replacement surgery: Secondary | ICD-10-CM | POA: Diagnosis not present

## 2018-01-26 DIAGNOSIS — M1711 Unilateral primary osteoarthritis, right knee: Secondary | ICD-10-CM | POA: Diagnosis not present

## 2018-01-26 DIAGNOSIS — E1142 Type 2 diabetes mellitus with diabetic polyneuropathy: Secondary | ICD-10-CM | POA: Diagnosis not present

## 2018-01-27 DIAGNOSIS — E119 Type 2 diabetes mellitus without complications: Secondary | ICD-10-CM | POA: Diagnosis not present

## 2018-01-28 DIAGNOSIS — E119 Type 2 diabetes mellitus without complications: Secondary | ICD-10-CM | POA: Diagnosis not present

## 2018-01-29 DIAGNOSIS — E119 Type 2 diabetes mellitus without complications: Secondary | ICD-10-CM | POA: Diagnosis not present

## 2018-01-30 DIAGNOSIS — E876 Hypokalemia: Secondary | ICD-10-CM | POA: Diagnosis not present

## 2018-01-30 DIAGNOSIS — I10 Essential (primary) hypertension: Secondary | ICD-10-CM | POA: Diagnosis not present

## 2018-01-30 DIAGNOSIS — M1711 Unilateral primary osteoarthritis, right knee: Secondary | ICD-10-CM | POA: Diagnosis not present

## 2018-01-30 DIAGNOSIS — I279 Pulmonary heart disease, unspecified: Secondary | ICD-10-CM | POA: Diagnosis not present

## 2018-01-30 DIAGNOSIS — E1142 Type 2 diabetes mellitus with diabetic polyneuropathy: Secondary | ICD-10-CM | POA: Diagnosis not present

## 2018-01-30 DIAGNOSIS — E119 Type 2 diabetes mellitus without complications: Secondary | ICD-10-CM | POA: Diagnosis not present

## 2018-01-30 DIAGNOSIS — Z471 Aftercare following joint replacement surgery: Secondary | ICD-10-CM | POA: Diagnosis not present

## 2018-01-31 DIAGNOSIS — E119 Type 2 diabetes mellitus without complications: Secondary | ICD-10-CM | POA: Diagnosis not present

## 2018-02-01 DIAGNOSIS — E876 Hypokalemia: Secondary | ICD-10-CM | POA: Diagnosis not present

## 2018-02-01 DIAGNOSIS — I10 Essential (primary) hypertension: Secondary | ICD-10-CM | POA: Diagnosis not present

## 2018-02-01 DIAGNOSIS — M1711 Unilateral primary osteoarthritis, right knee: Secondary | ICD-10-CM | POA: Diagnosis not present

## 2018-02-01 DIAGNOSIS — E119 Type 2 diabetes mellitus without complications: Secondary | ICD-10-CM | POA: Diagnosis not present

## 2018-02-01 DIAGNOSIS — Z471 Aftercare following joint replacement surgery: Secondary | ICD-10-CM | POA: Diagnosis not present

## 2018-02-01 DIAGNOSIS — E1142 Type 2 diabetes mellitus with diabetic polyneuropathy: Secondary | ICD-10-CM | POA: Diagnosis not present

## 2018-02-01 DIAGNOSIS — I279 Pulmonary heart disease, unspecified: Secondary | ICD-10-CM | POA: Diagnosis not present

## 2018-02-02 DIAGNOSIS — E119 Type 2 diabetes mellitus without complications: Secondary | ICD-10-CM | POA: Diagnosis not present

## 2018-02-03 DIAGNOSIS — E119 Type 2 diabetes mellitus without complications: Secondary | ICD-10-CM | POA: Diagnosis not present

## 2018-02-04 DIAGNOSIS — E119 Type 2 diabetes mellitus without complications: Secondary | ICD-10-CM | POA: Diagnosis not present

## 2018-02-05 DIAGNOSIS — E119 Type 2 diabetes mellitus without complications: Secondary | ICD-10-CM | POA: Diagnosis not present

## 2018-02-06 DIAGNOSIS — Z6838 Body mass index (BMI) 38.0-38.9, adult: Secondary | ICD-10-CM | POA: Diagnosis not present

## 2018-02-06 DIAGNOSIS — R29898 Other symptoms and signs involving the musculoskeletal system: Secondary | ICD-10-CM | POA: Diagnosis not present

## 2018-02-06 DIAGNOSIS — G8929 Other chronic pain: Secondary | ICD-10-CM | POA: Diagnosis not present

## 2018-02-06 DIAGNOSIS — M5416 Radiculopathy, lumbar region: Secondary | ICD-10-CM | POA: Diagnosis not present

## 2018-02-06 DIAGNOSIS — M6281 Muscle weakness (generalized): Secondary | ICD-10-CM | POA: Diagnosis not present

## 2018-02-06 DIAGNOSIS — E119 Type 2 diabetes mellitus without complications: Secondary | ICD-10-CM | POA: Diagnosis not present

## 2018-02-06 DIAGNOSIS — M5442 Lumbago with sciatica, left side: Secondary | ICD-10-CM | POA: Diagnosis not present

## 2018-02-06 DIAGNOSIS — G894 Chronic pain syndrome: Secondary | ICD-10-CM | POA: Diagnosis not present

## 2018-02-06 DIAGNOSIS — Z79891 Long term (current) use of opiate analgesic: Secondary | ICD-10-CM | POA: Diagnosis not present

## 2018-02-07 DIAGNOSIS — E119 Type 2 diabetes mellitus without complications: Secondary | ICD-10-CM | POA: Diagnosis not present

## 2018-02-08 DIAGNOSIS — E119 Type 2 diabetes mellitus without complications: Secondary | ICD-10-CM | POA: Diagnosis not present

## 2018-02-09 DIAGNOSIS — E119 Type 2 diabetes mellitus without complications: Secondary | ICD-10-CM | POA: Diagnosis not present

## 2018-02-10 DIAGNOSIS — E119 Type 2 diabetes mellitus without complications: Secondary | ICD-10-CM | POA: Diagnosis not present

## 2018-02-11 DIAGNOSIS — E119 Type 2 diabetes mellitus without complications: Secondary | ICD-10-CM | POA: Diagnosis not present

## 2018-02-12 DIAGNOSIS — E119 Type 2 diabetes mellitus without complications: Secondary | ICD-10-CM | POA: Diagnosis not present

## 2018-02-13 ENCOUNTER — Other Ambulatory Visit: Payer: Self-pay | Admitting: Family Medicine

## 2018-02-13 DIAGNOSIS — E119 Type 2 diabetes mellitus without complications: Secondary | ICD-10-CM | POA: Diagnosis not present

## 2018-02-14 DIAGNOSIS — E119 Type 2 diabetes mellitus without complications: Secondary | ICD-10-CM | POA: Diagnosis not present

## 2018-02-15 DIAGNOSIS — E119 Type 2 diabetes mellitus without complications: Secondary | ICD-10-CM | POA: Diagnosis not present

## 2018-02-16 DIAGNOSIS — M4726 Other spondylosis with radiculopathy, lumbar region: Secondary | ICD-10-CM | POA: Diagnosis not present

## 2018-02-16 DIAGNOSIS — M5442 Lumbago with sciatica, left side: Secondary | ICD-10-CM | POA: Diagnosis not present

## 2018-02-16 DIAGNOSIS — E119 Type 2 diabetes mellitus without complications: Secondary | ICD-10-CM | POA: Diagnosis not present

## 2018-02-16 DIAGNOSIS — M5116 Intervertebral disc disorders with radiculopathy, lumbar region: Secondary | ICD-10-CM | POA: Diagnosis not present

## 2018-02-16 DIAGNOSIS — G8929 Other chronic pain: Secondary | ICD-10-CM | POA: Diagnosis not present

## 2018-02-16 DIAGNOSIS — M4727 Other spondylosis with radiculopathy, lumbosacral region: Secondary | ICD-10-CM | POA: Diagnosis not present

## 2018-02-16 DIAGNOSIS — G894 Chronic pain syndrome: Secondary | ICD-10-CM | POA: Diagnosis not present

## 2018-02-16 DIAGNOSIS — R29898 Other symptoms and signs involving the musculoskeletal system: Secondary | ICD-10-CM | POA: Diagnosis not present

## 2018-02-16 DIAGNOSIS — M5117 Intervertebral disc disorders with radiculopathy, lumbosacral region: Secondary | ICD-10-CM | POA: Diagnosis not present

## 2018-02-16 DIAGNOSIS — M5416 Radiculopathy, lumbar region: Secondary | ICD-10-CM | POA: Diagnosis not present

## 2018-02-17 DIAGNOSIS — E119 Type 2 diabetes mellitus without complications: Secondary | ICD-10-CM | POA: Diagnosis not present

## 2018-02-18 DIAGNOSIS — E119 Type 2 diabetes mellitus without complications: Secondary | ICD-10-CM | POA: Diagnosis not present

## 2018-02-22 DIAGNOSIS — Z96652 Presence of left artificial knee joint: Secondary | ICD-10-CM | POA: Diagnosis not present

## 2018-02-22 DIAGNOSIS — M25662 Stiffness of left knee, not elsewhere classified: Secondary | ICD-10-CM | POA: Diagnosis not present

## 2018-02-22 DIAGNOSIS — R2689 Other abnormalities of gait and mobility: Secondary | ICD-10-CM | POA: Diagnosis not present

## 2018-02-26 DIAGNOSIS — E119 Type 2 diabetes mellitus without complications: Secondary | ICD-10-CM | POA: Diagnosis not present

## 2018-02-27 DIAGNOSIS — E119 Type 2 diabetes mellitus without complications: Secondary | ICD-10-CM | POA: Diagnosis not present

## 2018-02-28 DIAGNOSIS — E119 Type 2 diabetes mellitus without complications: Secondary | ICD-10-CM | POA: Diagnosis not present

## 2018-02-28 DIAGNOSIS — Z96652 Presence of left artificial knee joint: Secondary | ICD-10-CM | POA: Diagnosis not present

## 2018-02-28 DIAGNOSIS — M25662 Stiffness of left knee, not elsewhere classified: Secondary | ICD-10-CM | POA: Diagnosis not present

## 2018-02-28 DIAGNOSIS — R2689 Other abnormalities of gait and mobility: Secondary | ICD-10-CM | POA: Diagnosis not present

## 2018-03-01 DIAGNOSIS — E119 Type 2 diabetes mellitus without complications: Secondary | ICD-10-CM | POA: Diagnosis not present

## 2018-03-02 DIAGNOSIS — M25662 Stiffness of left knee, not elsewhere classified: Secondary | ICD-10-CM | POA: Diagnosis not present

## 2018-03-02 DIAGNOSIS — Z96652 Presence of left artificial knee joint: Secondary | ICD-10-CM | POA: Diagnosis not present

## 2018-03-02 DIAGNOSIS — E119 Type 2 diabetes mellitus without complications: Secondary | ICD-10-CM | POA: Diagnosis not present

## 2018-03-02 DIAGNOSIS — R2689 Other abnormalities of gait and mobility: Secondary | ICD-10-CM | POA: Diagnosis not present

## 2018-03-03 DIAGNOSIS — E119 Type 2 diabetes mellitus without complications: Secondary | ICD-10-CM | POA: Diagnosis not present

## 2018-03-04 DIAGNOSIS — E119 Type 2 diabetes mellitus without complications: Secondary | ICD-10-CM | POA: Diagnosis not present

## 2018-03-05 DIAGNOSIS — M25662 Stiffness of left knee, not elsewhere classified: Secondary | ICD-10-CM | POA: Diagnosis not present

## 2018-03-05 DIAGNOSIS — E119 Type 2 diabetes mellitus without complications: Secondary | ICD-10-CM | POA: Diagnosis not present

## 2018-03-05 DIAGNOSIS — Z96652 Presence of left artificial knee joint: Secondary | ICD-10-CM | POA: Diagnosis not present

## 2018-03-05 DIAGNOSIS — M25562 Pain in left knee: Secondary | ICD-10-CM | POA: Diagnosis not present

## 2018-03-06 DIAGNOSIS — M9989 Other biomechanical lesions of abdomen and other regions: Secondary | ICD-10-CM | POA: Diagnosis not present

## 2018-03-06 DIAGNOSIS — M519 Unspecified thoracic, thoracolumbar and lumbosacral intervertebral disc disorder: Secondary | ICD-10-CM | POA: Diagnosis not present

## 2018-03-06 DIAGNOSIS — G894 Chronic pain syndrome: Secondary | ICD-10-CM | POA: Diagnosis not present

## 2018-03-06 DIAGNOSIS — G8929 Other chronic pain: Secondary | ICD-10-CM | POA: Diagnosis not present

## 2018-03-06 DIAGNOSIS — M5442 Lumbago with sciatica, left side: Secondary | ICD-10-CM | POA: Diagnosis not present

## 2018-03-06 DIAGNOSIS — R29898 Other symptoms and signs involving the musculoskeletal system: Secondary | ICD-10-CM | POA: Diagnosis not present

## 2018-03-06 DIAGNOSIS — E119 Type 2 diabetes mellitus without complications: Secondary | ICD-10-CM | POA: Diagnosis not present

## 2018-03-06 DIAGNOSIS — R531 Weakness: Secondary | ICD-10-CM | POA: Diagnosis not present

## 2018-03-06 DIAGNOSIS — M5416 Radiculopathy, lumbar region: Secondary | ICD-10-CM | POA: Diagnosis not present

## 2018-03-07 DIAGNOSIS — E119 Type 2 diabetes mellitus without complications: Secondary | ICD-10-CM | POA: Diagnosis not present

## 2018-03-08 DIAGNOSIS — E119 Type 2 diabetes mellitus without complications: Secondary | ICD-10-CM | POA: Diagnosis not present

## 2018-03-09 DIAGNOSIS — M25662 Stiffness of left knee, not elsewhere classified: Secondary | ICD-10-CM | POA: Diagnosis not present

## 2018-03-09 DIAGNOSIS — M25562 Pain in left knee: Secondary | ICD-10-CM | POA: Diagnosis not present

## 2018-03-09 DIAGNOSIS — Z96652 Presence of left artificial knee joint: Secondary | ICD-10-CM | POA: Diagnosis not present

## 2018-03-09 DIAGNOSIS — E119 Type 2 diabetes mellitus without complications: Secondary | ICD-10-CM | POA: Diagnosis not present

## 2018-03-10 DIAGNOSIS — E119 Type 2 diabetes mellitus without complications: Secondary | ICD-10-CM | POA: Diagnosis not present

## 2018-03-11 DIAGNOSIS — E119 Type 2 diabetes mellitus without complications: Secondary | ICD-10-CM | POA: Diagnosis not present

## 2018-03-12 DIAGNOSIS — M25562 Pain in left knee: Secondary | ICD-10-CM | POA: Diagnosis not present

## 2018-03-12 DIAGNOSIS — M25662 Stiffness of left knee, not elsewhere classified: Secondary | ICD-10-CM | POA: Diagnosis not present

## 2018-03-12 DIAGNOSIS — Z96652 Presence of left artificial knee joint: Secondary | ICD-10-CM | POA: Diagnosis not present

## 2018-03-12 DIAGNOSIS — E119 Type 2 diabetes mellitus without complications: Secondary | ICD-10-CM | POA: Diagnosis not present

## 2018-03-13 DIAGNOSIS — E119 Type 2 diabetes mellitus without complications: Secondary | ICD-10-CM | POA: Diagnosis not present

## 2018-03-14 DIAGNOSIS — E119 Type 2 diabetes mellitus without complications: Secondary | ICD-10-CM | POA: Diagnosis not present

## 2018-03-15 DIAGNOSIS — Z96652 Presence of left artificial knee joint: Secondary | ICD-10-CM | POA: Diagnosis not present

## 2018-03-15 DIAGNOSIS — M25662 Stiffness of left knee, not elsewhere classified: Secondary | ICD-10-CM | POA: Diagnosis not present

## 2018-03-15 DIAGNOSIS — E119 Type 2 diabetes mellitus without complications: Secondary | ICD-10-CM | POA: Diagnosis not present

## 2018-03-15 DIAGNOSIS — M25562 Pain in left knee: Secondary | ICD-10-CM | POA: Diagnosis not present

## 2018-03-16 DIAGNOSIS — E119 Type 2 diabetes mellitus without complications: Secondary | ICD-10-CM | POA: Diagnosis not present

## 2018-03-17 DIAGNOSIS — E119 Type 2 diabetes mellitus without complications: Secondary | ICD-10-CM | POA: Diagnosis not present

## 2018-03-18 DIAGNOSIS — E119 Type 2 diabetes mellitus without complications: Secondary | ICD-10-CM | POA: Diagnosis not present

## 2018-03-19 ENCOUNTER — Other Ambulatory Visit: Payer: Self-pay | Admitting: Family Medicine

## 2018-03-19 DIAGNOSIS — Z96652 Presence of left artificial knee joint: Secondary | ICD-10-CM | POA: Diagnosis not present

## 2018-03-19 DIAGNOSIS — M25662 Stiffness of left knee, not elsewhere classified: Secondary | ICD-10-CM | POA: Diagnosis not present

## 2018-03-19 DIAGNOSIS — E119 Type 2 diabetes mellitus without complications: Secondary | ICD-10-CM | POA: Diagnosis not present

## 2018-03-19 DIAGNOSIS — M25562 Pain in left knee: Secondary | ICD-10-CM | POA: Diagnosis not present

## 2018-03-20 DIAGNOSIS — E119 Type 2 diabetes mellitus without complications: Secondary | ICD-10-CM | POA: Diagnosis not present

## 2018-03-21 DIAGNOSIS — E119 Type 2 diabetes mellitus without complications: Secondary | ICD-10-CM | POA: Diagnosis not present

## 2018-03-22 DIAGNOSIS — M5442 Lumbago with sciatica, left side: Secondary | ICD-10-CM | POA: Diagnosis not present

## 2018-03-22 DIAGNOSIS — K219 Gastro-esophageal reflux disease without esophagitis: Secondary | ICD-10-CM | POA: Diagnosis not present

## 2018-03-22 DIAGNOSIS — G894 Chronic pain syndrome: Secondary | ICD-10-CM | POA: Diagnosis not present

## 2018-03-22 DIAGNOSIS — E11319 Type 2 diabetes mellitus with unspecified diabetic retinopathy without macular edema: Secondary | ICD-10-CM | POA: Diagnosis not present

## 2018-03-22 DIAGNOSIS — I10 Essential (primary) hypertension: Secondary | ICD-10-CM | POA: Diagnosis not present

## 2018-03-22 DIAGNOSIS — M5416 Radiculopathy, lumbar region: Secondary | ICD-10-CM | POA: Diagnosis not present

## 2018-03-22 DIAGNOSIS — E785 Hyperlipidemia, unspecified: Secondary | ICD-10-CM | POA: Diagnosis not present

## 2018-03-22 DIAGNOSIS — E119 Type 2 diabetes mellitus without complications: Secondary | ICD-10-CM | POA: Diagnosis not present

## 2018-03-22 DIAGNOSIS — Z6841 Body Mass Index (BMI) 40.0 and over, adult: Secondary | ICD-10-CM | POA: Diagnosis not present

## 2018-03-22 DIAGNOSIS — M48061 Spinal stenosis, lumbar region without neurogenic claudication: Secondary | ICD-10-CM | POA: Diagnosis not present

## 2018-03-23 DIAGNOSIS — E119 Type 2 diabetes mellitus without complications: Secondary | ICD-10-CM | POA: Diagnosis not present

## 2018-03-24 DIAGNOSIS — E119 Type 2 diabetes mellitus without complications: Secondary | ICD-10-CM | POA: Diagnosis not present

## 2018-03-25 DIAGNOSIS — E119 Type 2 diabetes mellitus without complications: Secondary | ICD-10-CM | POA: Diagnosis not present

## 2018-03-26 DIAGNOSIS — E119 Type 2 diabetes mellitus without complications: Secondary | ICD-10-CM | POA: Diagnosis not present

## 2018-03-27 DIAGNOSIS — E119 Type 2 diabetes mellitus without complications: Secondary | ICD-10-CM | POA: Diagnosis not present

## 2018-03-28 DIAGNOSIS — Z96652 Presence of left artificial knee joint: Secondary | ICD-10-CM | POA: Diagnosis not present

## 2018-03-28 DIAGNOSIS — M25562 Pain in left knee: Secondary | ICD-10-CM | POA: Diagnosis not present

## 2018-03-28 DIAGNOSIS — E119 Type 2 diabetes mellitus without complications: Secondary | ICD-10-CM | POA: Diagnosis not present

## 2018-03-28 DIAGNOSIS — M25662 Stiffness of left knee, not elsewhere classified: Secondary | ICD-10-CM | POA: Diagnosis not present

## 2018-03-29 DIAGNOSIS — E119 Type 2 diabetes mellitus without complications: Secondary | ICD-10-CM | POA: Diagnosis not present

## 2018-03-30 DIAGNOSIS — M25662 Stiffness of left knee, not elsewhere classified: Secondary | ICD-10-CM | POA: Diagnosis not present

## 2018-03-30 DIAGNOSIS — M25562 Pain in left knee: Secondary | ICD-10-CM | POA: Diagnosis not present

## 2018-03-30 DIAGNOSIS — E119 Type 2 diabetes mellitus without complications: Secondary | ICD-10-CM | POA: Diagnosis not present

## 2018-03-30 DIAGNOSIS — Z96652 Presence of left artificial knee joint: Secondary | ICD-10-CM | POA: Diagnosis not present

## 2018-03-31 DIAGNOSIS — E119 Type 2 diabetes mellitus without complications: Secondary | ICD-10-CM | POA: Diagnosis not present

## 2018-04-01 DIAGNOSIS — E119 Type 2 diabetes mellitus without complications: Secondary | ICD-10-CM | POA: Diagnosis not present

## 2018-04-02 DIAGNOSIS — E119 Type 2 diabetes mellitus without complications: Secondary | ICD-10-CM | POA: Diagnosis not present

## 2018-04-03 DIAGNOSIS — E119 Type 2 diabetes mellitus without complications: Secondary | ICD-10-CM | POA: Diagnosis not present

## 2018-04-04 DIAGNOSIS — E119 Type 2 diabetes mellitus without complications: Secondary | ICD-10-CM | POA: Diagnosis not present

## 2018-04-05 DIAGNOSIS — M5442 Lumbago with sciatica, left side: Secondary | ICD-10-CM | POA: Diagnosis not present

## 2018-04-05 DIAGNOSIS — Z79899 Other long term (current) drug therapy: Secondary | ICD-10-CM | POA: Diagnosis not present

## 2018-04-05 DIAGNOSIS — E119 Type 2 diabetes mellitus without complications: Secondary | ICD-10-CM | POA: Diagnosis not present

## 2018-04-05 DIAGNOSIS — R531 Weakness: Secondary | ICD-10-CM | POA: Diagnosis not present

## 2018-04-05 DIAGNOSIS — G8929 Other chronic pain: Secondary | ICD-10-CM | POA: Diagnosis not present

## 2018-04-05 DIAGNOSIS — M25561 Pain in right knee: Secondary | ICD-10-CM | POA: Diagnosis not present

## 2018-04-05 DIAGNOSIS — M519 Unspecified thoracic, thoracolumbar and lumbosacral intervertebral disc disorder: Secondary | ICD-10-CM | POA: Diagnosis not present

## 2018-04-05 DIAGNOSIS — R29898 Other symptoms and signs involving the musculoskeletal system: Secondary | ICD-10-CM | POA: Diagnosis not present

## 2018-04-05 DIAGNOSIS — M9989 Other biomechanical lesions of abdomen and other regions: Secondary | ICD-10-CM | POA: Diagnosis not present

## 2018-04-05 DIAGNOSIS — G894 Chronic pain syndrome: Secondary | ICD-10-CM | POA: Diagnosis not present

## 2018-04-05 DIAGNOSIS — M5416 Radiculopathy, lumbar region: Secondary | ICD-10-CM | POA: Diagnosis not present

## 2018-04-06 DIAGNOSIS — E119 Type 2 diabetes mellitus without complications: Secondary | ICD-10-CM | POA: Diagnosis not present

## 2018-04-06 DIAGNOSIS — R29898 Other symptoms and signs involving the musculoskeletal system: Secondary | ICD-10-CM | POA: Diagnosis not present

## 2018-04-06 DIAGNOSIS — M25662 Stiffness of left knee, not elsewhere classified: Secondary | ICD-10-CM | POA: Diagnosis not present

## 2018-04-06 DIAGNOSIS — G4733 Obstructive sleep apnea (adult) (pediatric): Secondary | ICD-10-CM | POA: Diagnosis not present

## 2018-04-07 DIAGNOSIS — E119 Type 2 diabetes mellitus without complications: Secondary | ICD-10-CM | POA: Diagnosis not present

## 2018-04-08 DIAGNOSIS — E119 Type 2 diabetes mellitus without complications: Secondary | ICD-10-CM | POA: Diagnosis not present

## 2018-04-09 DIAGNOSIS — M1711 Unilateral primary osteoarthritis, right knee: Secondary | ICD-10-CM | POA: Diagnosis not present

## 2018-04-09 DIAGNOSIS — I10 Essential (primary) hypertension: Secondary | ICD-10-CM | POA: Diagnosis not present

## 2018-04-09 DIAGNOSIS — I872 Venous insufficiency (chronic) (peripheral): Secondary | ICD-10-CM | POA: Diagnosis not present

## 2018-04-09 DIAGNOSIS — Z1159 Encounter for screening for other viral diseases: Secondary | ICD-10-CM | POA: Diagnosis not present

## 2018-04-09 DIAGNOSIS — G8929 Other chronic pain: Secondary | ICD-10-CM | POA: Diagnosis not present

## 2018-04-09 DIAGNOSIS — E1165 Type 2 diabetes mellitus with hyperglycemia: Secondary | ICD-10-CM | POA: Diagnosis not present

## 2018-04-09 DIAGNOSIS — M21611 Bunion of right foot: Secondary | ICD-10-CM | POA: Diagnosis not present

## 2018-04-09 DIAGNOSIS — E113599 Type 2 diabetes mellitus with proliferative diabetic retinopathy without macular edema, unspecified eye: Secondary | ICD-10-CM | POA: Diagnosis not present

## 2018-04-09 DIAGNOSIS — G894 Chronic pain syndrome: Secondary | ICD-10-CM | POA: Diagnosis not present

## 2018-04-09 DIAGNOSIS — Z794 Long term (current) use of insulin: Secondary | ICD-10-CM | POA: Diagnosis not present

## 2018-04-09 DIAGNOSIS — E7800 Pure hypercholesterolemia, unspecified: Secondary | ICD-10-CM | POA: Diagnosis not present

## 2018-04-09 DIAGNOSIS — R609 Edema, unspecified: Secondary | ICD-10-CM | POA: Diagnosis not present

## 2018-04-09 DIAGNOSIS — E119 Type 2 diabetes mellitus without complications: Secondary | ICD-10-CM | POA: Diagnosis not present

## 2018-04-09 DIAGNOSIS — M25561 Pain in right knee: Secondary | ICD-10-CM | POA: Diagnosis not present

## 2018-04-10 DIAGNOSIS — R29898 Other symptoms and signs involving the musculoskeletal system: Secondary | ICD-10-CM | POA: Diagnosis not present

## 2018-04-10 DIAGNOSIS — E119 Type 2 diabetes mellitus without complications: Secondary | ICD-10-CM | POA: Diagnosis not present

## 2018-04-10 DIAGNOSIS — M25662 Stiffness of left knee, not elsewhere classified: Secondary | ICD-10-CM | POA: Diagnosis not present

## 2018-04-10 DIAGNOSIS — Z96652 Presence of left artificial knee joint: Secondary | ICD-10-CM | POA: Diagnosis not present

## 2018-04-10 DIAGNOSIS — M1711 Unilateral primary osteoarthritis, right knee: Secondary | ICD-10-CM | POA: Diagnosis not present

## 2018-04-11 DIAGNOSIS — E119 Type 2 diabetes mellitus without complications: Secondary | ICD-10-CM | POA: Diagnosis not present

## 2018-04-12 DIAGNOSIS — E119 Type 2 diabetes mellitus without complications: Secondary | ICD-10-CM | POA: Diagnosis not present

## 2018-04-13 DIAGNOSIS — R32 Unspecified urinary incontinence: Secondary | ICD-10-CM | POA: Diagnosis not present

## 2018-04-13 DIAGNOSIS — N200 Calculus of kidney: Secondary | ICD-10-CM | POA: Diagnosis not present

## 2018-04-13 DIAGNOSIS — Z87442 Personal history of urinary calculi: Secondary | ICD-10-CM | POA: Diagnosis not present

## 2018-04-13 DIAGNOSIS — E119 Type 2 diabetes mellitus without complications: Secondary | ICD-10-CM | POA: Diagnosis not present

## 2018-04-13 DIAGNOSIS — N29 Other disorders of kidney and ureter in diseases classified elsewhere: Secondary | ICD-10-CM | POA: Diagnosis not present

## 2018-04-14 DIAGNOSIS — E119 Type 2 diabetes mellitus without complications: Secondary | ICD-10-CM | POA: Diagnosis not present

## 2018-04-15 DIAGNOSIS — E119 Type 2 diabetes mellitus without complications: Secondary | ICD-10-CM | POA: Diagnosis not present

## 2018-04-16 DIAGNOSIS — E119 Type 2 diabetes mellitus without complications: Secondary | ICD-10-CM | POA: Diagnosis not present

## 2018-04-17 DIAGNOSIS — E119 Type 2 diabetes mellitus without complications: Secondary | ICD-10-CM | POA: Diagnosis not present

## 2018-04-18 DIAGNOSIS — E119 Type 2 diabetes mellitus without complications: Secondary | ICD-10-CM | POA: Diagnosis not present

## 2018-04-19 DIAGNOSIS — E119 Type 2 diabetes mellitus without complications: Secondary | ICD-10-CM | POA: Diagnosis not present

## 2018-04-20 DIAGNOSIS — E119 Type 2 diabetes mellitus without complications: Secondary | ICD-10-CM | POA: Diagnosis not present

## 2018-04-21 DIAGNOSIS — E119 Type 2 diabetes mellitus without complications: Secondary | ICD-10-CM | POA: Diagnosis not present

## 2018-04-22 DIAGNOSIS — E119 Type 2 diabetes mellitus without complications: Secondary | ICD-10-CM | POA: Diagnosis not present

## 2018-04-23 ENCOUNTER — Other Ambulatory Visit: Payer: Self-pay

## 2018-04-23 MED ORDER — ACCU-CHEK AVIVA PLUS W/DEVICE KIT: PACK | 0 refills | Status: AC

## 2018-05-25 DIAGNOSIS — M1711 Unilateral primary osteoarthritis, right knee: Secondary | ICD-10-CM | POA: Diagnosis not present

## 2018-05-28 DIAGNOSIS — I10 Essential (primary) hypertension: Secondary | ICD-10-CM | POA: Diagnosis not present

## 2018-05-28 DIAGNOSIS — Z6839 Body mass index (BMI) 39.0-39.9, adult: Secondary | ICD-10-CM | POA: Diagnosis not present

## 2018-05-28 DIAGNOSIS — Z9989 Dependence on other enabling machines and devices: Secondary | ICD-10-CM | POA: Diagnosis not present

## 2018-05-28 DIAGNOSIS — E6609 Other obesity due to excess calories: Secondary | ICD-10-CM | POA: Diagnosis not present

## 2018-05-28 DIAGNOSIS — G4733 Obstructive sleep apnea (adult) (pediatric): Secondary | ICD-10-CM | POA: Diagnosis not present

## 2018-05-30 DIAGNOSIS — Z23 Encounter for immunization: Secondary | ICD-10-CM | POA: Diagnosis not present

## 2018-05-30 DIAGNOSIS — Z1211 Encounter for screening for malignant neoplasm of colon: Secondary | ICD-10-CM | POA: Diagnosis not present

## 2018-05-30 DIAGNOSIS — Z135 Encounter for screening for eye and ear disorders: Secondary | ICD-10-CM | POA: Diagnosis not present

## 2018-05-30 DIAGNOSIS — Z0000 Encounter for general adult medical examination without abnormal findings: Secondary | ICD-10-CM | POA: Diagnosis not present

## 2018-05-30 DIAGNOSIS — Z1212 Encounter for screening for malignant neoplasm of rectum: Secondary | ICD-10-CM | POA: Diagnosis not present

## 2018-05-31 DIAGNOSIS — M1711 Unilateral primary osteoarthritis, right knee: Secondary | ICD-10-CM | POA: Diagnosis not present

## 2018-05-31 DIAGNOSIS — M7121 Synovial cyst of popliteal space [Baker], right knee: Secondary | ICD-10-CM | POA: Diagnosis not present

## 2018-05-31 DIAGNOSIS — M25461 Effusion, right knee: Secondary | ICD-10-CM | POA: Diagnosis not present

## 2018-06-20 DIAGNOSIS — R05 Cough: Secondary | ICD-10-CM | POA: Diagnosis not present

## 2018-07-02 ENCOUNTER — Other Ambulatory Visit: Payer: Self-pay | Admitting: Family Medicine

## 2018-07-02 DIAGNOSIS — E119 Type 2 diabetes mellitus without complications: Secondary | ICD-10-CM | POA: Diagnosis not present

## 2018-07-03 DIAGNOSIS — E119 Type 2 diabetes mellitus without complications: Secondary | ICD-10-CM | POA: Diagnosis not present

## 2018-07-04 DIAGNOSIS — E119 Type 2 diabetes mellitus without complications: Secondary | ICD-10-CM | POA: Diagnosis not present

## 2018-07-04 DIAGNOSIS — E1165 Type 2 diabetes mellitus with hyperglycemia: Secondary | ICD-10-CM | POA: Diagnosis not present

## 2018-07-04 DIAGNOSIS — E113599 Type 2 diabetes mellitus with proliferative diabetic retinopathy without macular edema, unspecified eye: Secondary | ICD-10-CM | POA: Diagnosis not present

## 2018-07-04 DIAGNOSIS — Z794 Long term (current) use of insulin: Secondary | ICD-10-CM | POA: Diagnosis not present

## 2018-07-04 DIAGNOSIS — Z6841 Body Mass Index (BMI) 40.0 and over, adult: Secondary | ICD-10-CM | POA: Diagnosis not present

## 2018-07-04 DIAGNOSIS — I10 Essential (primary) hypertension: Secondary | ICD-10-CM | POA: Diagnosis not present

## 2018-07-05 DIAGNOSIS — E119 Type 2 diabetes mellitus without complications: Secondary | ICD-10-CM | POA: Diagnosis not present

## 2018-07-05 DIAGNOSIS — G4733 Obstructive sleep apnea (adult) (pediatric): Secondary | ICD-10-CM | POA: Diagnosis not present

## 2018-07-06 DIAGNOSIS — E119 Type 2 diabetes mellitus without complications: Secondary | ICD-10-CM | POA: Diagnosis not present

## 2018-07-07 DIAGNOSIS — E119 Type 2 diabetes mellitus without complications: Secondary | ICD-10-CM | POA: Diagnosis not present

## 2018-07-08 DIAGNOSIS — E119 Type 2 diabetes mellitus without complications: Secondary | ICD-10-CM | POA: Diagnosis not present

## 2018-07-09 DIAGNOSIS — R51 Headache: Secondary | ICD-10-CM | POA: Diagnosis not present

## 2018-07-09 DIAGNOSIS — E119 Type 2 diabetes mellitus without complications: Secondary | ICD-10-CM | POA: Diagnosis not present

## 2018-07-09 DIAGNOSIS — R05 Cough: Secondary | ICD-10-CM | POA: Diagnosis not present

## 2018-07-10 DIAGNOSIS — E119 Type 2 diabetes mellitus without complications: Secondary | ICD-10-CM | POA: Diagnosis not present

## 2018-07-11 DIAGNOSIS — E119 Type 2 diabetes mellitus without complications: Secondary | ICD-10-CM | POA: Diagnosis not present

## 2018-07-12 DIAGNOSIS — E119 Type 2 diabetes mellitus without complications: Secondary | ICD-10-CM | POA: Diagnosis not present

## 2018-07-13 DIAGNOSIS — E119 Type 2 diabetes mellitus without complications: Secondary | ICD-10-CM | POA: Diagnosis not present

## 2018-07-14 DIAGNOSIS — Y92410 Unspecified street and highway as the place of occurrence of the external cause: Secondary | ICD-10-CM | POA: Diagnosis not present

## 2018-07-14 DIAGNOSIS — E119 Type 2 diabetes mellitus without complications: Secondary | ICD-10-CM | POA: Diagnosis not present

## 2018-07-14 DIAGNOSIS — S39012A Strain of muscle, fascia and tendon of lower back, initial encounter: Secondary | ICD-10-CM | POA: Diagnosis not present

## 2018-07-14 DIAGNOSIS — S99922A Unspecified injury of left foot, initial encounter: Secondary | ICD-10-CM | POA: Diagnosis not present

## 2018-07-14 DIAGNOSIS — M542 Cervicalgia: Secondary | ICD-10-CM | POA: Diagnosis not present

## 2018-07-14 DIAGNOSIS — Y999 Unspecified external cause status: Secondary | ICD-10-CM | POA: Diagnosis not present

## 2018-07-15 DIAGNOSIS — E119 Type 2 diabetes mellitus without complications: Secondary | ICD-10-CM | POA: Diagnosis not present

## 2018-07-18 DIAGNOSIS — E113212 Type 2 diabetes mellitus with mild nonproliferative diabetic retinopathy with macular edema, left eye: Secondary | ICD-10-CM | POA: Diagnosis not present

## 2018-07-18 DIAGNOSIS — H2513 Age-related nuclear cataract, bilateral: Secondary | ICD-10-CM | POA: Diagnosis not present

## 2018-07-18 DIAGNOSIS — H40013 Open angle with borderline findings, low risk, bilateral: Secondary | ICD-10-CM | POA: Diagnosis not present

## 2018-07-18 DIAGNOSIS — H0102A Squamous blepharitis right eye, upper and lower eyelids: Secondary | ICD-10-CM | POA: Diagnosis not present

## 2018-07-18 DIAGNOSIS — H0102B Squamous blepharitis left eye, upper and lower eyelids: Secondary | ICD-10-CM | POA: Diagnosis not present

## 2018-07-18 DIAGNOSIS — E113291 Type 2 diabetes mellitus with mild nonproliferative diabetic retinopathy without macular edema, right eye: Secondary | ICD-10-CM | POA: Diagnosis not present

## 2018-07-23 ENCOUNTER — Other Ambulatory Visit: Payer: Self-pay | Admitting: Family Medicine

## 2018-07-24 DIAGNOSIS — M519 Unspecified thoracic, thoracolumbar and lumbosacral intervertebral disc disorder: Secondary | ICD-10-CM | POA: Diagnosis not present

## 2018-07-24 DIAGNOSIS — M6281 Muscle weakness (generalized): Secondary | ICD-10-CM | POA: Diagnosis not present

## 2018-07-24 DIAGNOSIS — I10 Essential (primary) hypertension: Secondary | ICD-10-CM | POA: Diagnosis not present

## 2018-07-24 DIAGNOSIS — M9989 Other biomechanical lesions of abdomen and other regions: Secondary | ICD-10-CM | POA: Diagnosis not present

## 2018-07-24 DIAGNOSIS — G8929 Other chronic pain: Secondary | ICD-10-CM | POA: Diagnosis not present

## 2018-07-24 DIAGNOSIS — M25561 Pain in right knee: Secondary | ICD-10-CM | POA: Diagnosis not present

## 2018-07-24 DIAGNOSIS — M5442 Lumbago with sciatica, left side: Secondary | ICD-10-CM | POA: Diagnosis not present

## 2018-07-24 DIAGNOSIS — R29898 Other symptoms and signs involving the musculoskeletal system: Secondary | ICD-10-CM | POA: Diagnosis not present

## 2018-07-24 DIAGNOSIS — M9953 Intervertebral disc stenosis of neural canal of lumbar region: Secondary | ICD-10-CM | POA: Diagnosis not present

## 2018-07-24 DIAGNOSIS — M5416 Radiculopathy, lumbar region: Secondary | ICD-10-CM | POA: Diagnosis not present

## 2018-07-24 DIAGNOSIS — G894 Chronic pain syndrome: Secondary | ICD-10-CM | POA: Diagnosis not present

## 2018-07-30 DIAGNOSIS — E119 Type 2 diabetes mellitus without complications: Secondary | ICD-10-CM | POA: Diagnosis not present

## 2018-07-31 DIAGNOSIS — E119 Type 2 diabetes mellitus without complications: Secondary | ICD-10-CM | POA: Diagnosis not present

## 2018-08-01 DIAGNOSIS — E119 Type 2 diabetes mellitus without complications: Secondary | ICD-10-CM | POA: Diagnosis not present

## 2018-08-02 DIAGNOSIS — E119 Type 2 diabetes mellitus without complications: Secondary | ICD-10-CM | POA: Diagnosis not present

## 2018-08-03 DIAGNOSIS — H3582 Retinal ischemia: Secondary | ICD-10-CM | POA: Diagnosis not present

## 2018-08-03 DIAGNOSIS — E113212 Type 2 diabetes mellitus with mild nonproliferative diabetic retinopathy with macular edema, left eye: Secondary | ICD-10-CM | POA: Diagnosis not present

## 2018-08-03 DIAGNOSIS — H25813 Combined forms of age-related cataract, bilateral: Secondary | ICD-10-CM | POA: Diagnosis not present

## 2018-08-03 DIAGNOSIS — E113291 Type 2 diabetes mellitus with mild nonproliferative diabetic retinopathy without macular edema, right eye: Secondary | ICD-10-CM | POA: Diagnosis not present

## 2018-08-03 DIAGNOSIS — E119 Type 2 diabetes mellitus without complications: Secondary | ICD-10-CM | POA: Diagnosis not present

## 2018-08-04 DIAGNOSIS — E119 Type 2 diabetes mellitus without complications: Secondary | ICD-10-CM | POA: Diagnosis not present

## 2018-08-05 DIAGNOSIS — E119 Type 2 diabetes mellitus without complications: Secondary | ICD-10-CM | POA: Diagnosis not present

## 2018-08-06 ENCOUNTER — Other Ambulatory Visit: Payer: Self-pay | Admitting: Family Medicine

## 2018-08-06 DIAGNOSIS — E119 Type 2 diabetes mellitus without complications: Secondary | ICD-10-CM | POA: Diagnosis not present

## 2018-08-06 DIAGNOSIS — Z1231 Encounter for screening mammogram for malignant neoplasm of breast: Secondary | ICD-10-CM

## 2018-08-07 DIAGNOSIS — E119 Type 2 diabetes mellitus without complications: Secondary | ICD-10-CM | POA: Diagnosis not present

## 2018-08-08 ENCOUNTER — Encounter: Payer: Self-pay | Admitting: Obstetrics

## 2018-08-08 ENCOUNTER — Other Ambulatory Visit (HOSPITAL_COMMUNITY)
Admission: RE | Admit: 2018-08-08 | Discharge: 2018-08-08 | Disposition: A | Discharge status: Home / Self Care | Payer: Medicare HMO | Source: Ambulatory Visit | Attending: Obstetrics | Admitting: Obstetrics

## 2018-08-08 ENCOUNTER — Other Ambulatory Visit: Payer: Self-pay

## 2018-08-08 ENCOUNTER — Ambulatory Visit (INDEPENDENT_AMBULATORY_CARE_PROVIDER_SITE_OTHER): Payer: Medicare HMO | Admitting: Obstetrics

## 2018-08-08 VITALS — BP 117/80 | HR 73 | Ht 68.0 in | Wt 263.0 lb

## 2018-08-08 DIAGNOSIS — B373 Candidiasis of vulva and vagina: Secondary | ICD-10-CM

## 2018-08-08 DIAGNOSIS — L089 Local infection of the skin and subcutaneous tissue, unspecified: Secondary | ICD-10-CM

## 2018-08-08 DIAGNOSIS — Z01419 Encounter for gynecological examination (general) (routine) without abnormal findings: Secondary | ICD-10-CM | POA: Diagnosis not present

## 2018-08-08 DIAGNOSIS — B9689 Other specified bacterial agents as the cause of diseases classified elsewhere: Secondary | ICD-10-CM

## 2018-08-08 DIAGNOSIS — Z90710 Acquired absence of both cervix and uterus: Secondary | ICD-10-CM

## 2018-08-08 DIAGNOSIS — E139 Other specified diabetes mellitus without complications: Secondary | ICD-10-CM | POA: Diagnosis not present

## 2018-08-08 DIAGNOSIS — N898 Other specified noninflammatory disorders of vagina: Secondary | ICD-10-CM | POA: Diagnosis not present

## 2018-08-08 DIAGNOSIS — E66813 Obesity, class 3: Secondary | ICD-10-CM

## 2018-08-08 DIAGNOSIS — E119 Type 2 diabetes mellitus without complications: Secondary | ICD-10-CM | POA: Diagnosis not present

## 2018-08-08 DIAGNOSIS — Z6841 Body Mass Index (BMI) 40.0 and over, adult: Secondary | ICD-10-CM | POA: Diagnosis not present

## 2018-08-08 DIAGNOSIS — B3731 Acute candidiasis of vulva and vagina: Secondary | ICD-10-CM

## 2018-08-08 MED ORDER — CLINDAMYCIN HCL 300 MG PO CAPS: ORAL_CAPSULE | ORAL | 2 refills | Status: DC

## 2018-08-08 MED ORDER — FLUCONAZOLE 200 MG PO TABS: ORAL_TABLET | ORAL | 5 refills | Status: DC

## 2018-08-08 NOTE — Progress Notes (Signed)
Subjective:        Kathryn Crosby is a 58 y.o. female here for a routine exam.  Current complaints: None.    Personal health questionnaire:  Is patient Ashkenazi Jewish, have a family history of breast and/or ovarian cancer: no Is there a family history of uterine cancer diagnosed at age < 26, gastrointestinal cancer, urinary tract cancer, family member who is a Field seismologist syndrome-associated carrier: no Is the patient overweight and hypertensive, family history of diabetes, personal history of gestational diabetes, preeclampsia or PCOS: yes Is patient over 24, have PCOS,  family history of premature CHD under age 47, diabetes, smoke, have hypertension or peripheral artery disease:  yes At any time, has a partner hit, kicked or otherwise hurt or frightened you?: no Over the past 2 weeks, have you felt down, depressed or hopeless?: no Over the past 2 weeks, have you felt little interest or pleasure in doing things?:no   Gynecologic History No LMP recorded. Patient has had a hysterectomy. Contraception: status post hysterectomy Last Pap: 2010. Results were: normal Last mammogram: 2019. Results were: normal  Obstetric History OB History  Gravida Para Term Preterm AB Living  '3 3 3     3  ' SAB TAB Ectopic Multiple Live Births          3    # Outcome Date GA Lbr Len/2nd Weight Sex Delivery Anes PTL Lv  3 Term 09/16/81   8 lb (3.629 kg) F Vag-Spont EPI  LIV  2 Term 11/29/77   7 lb 6 oz (3.345 kg) M Vag-Spont EPI  LIV  1 Term 02/29/76   7 lb 9 oz (3.43 kg) M Vag-Spont EPI  LIV    Past Medical History:  Diagnosis Date  . Arthritis   . Chronic headaches   . Diabetes mellitus   . GERD (gastroesophageal reflux disease)   . Hyperlipidemia   . Hypertension   . Kidney stones   . Migraine   . Nephrolithiasis   . Varicose veins 03-30-15   Bilateral Leg    Past Surgical History:  Procedure Laterality Date  . ABDOMINAL HYSTERECTOMY    . BREAST BIOPSY Bilateral    benign  . left  ankle fracture    . LITHOTRIPSY    . REPLACEMENT TOTAL KNEE Left   . TONSILLECTOMY       Current Outpatient Medications:  .  ACCU-CHEK SOFTCLIX LANCETS lancets, Use to check blood sugar twice daily.  Dx code: E11.9, E11.40, Disp: 200 each, Rfl: 3 .  Alcohol Swabs (B-D SINGLE USE SWABS REGULAR) PADS, USE TWICE DAILY  OR AS DIRECTED, Disp: 200 each, Rfl: 6 .  amLODipine (NORVASC) 10 MG tablet, TAKE 1 TABLET EVERY DAY, Disp: 90 tablet, Rfl: 3 .  atorvastatin (LIPITOR) 40 MG tablet, TAKE 1 TABLET AT BEDTIME, Disp: 90 tablet, Rfl: 2 .  B-D ULTRAFINE III SHORT PEN 31G X 8 MM MISC, USE WITH INSULIN PEN, Disp: 180 each, Rfl: 5 .  Blood Glucose Calibration (ACCU-CHEK AVIVA) SOLN, Use as directed., Disp: 3 each, Rfl: 0 .  Blood Glucose Monitoring Suppl (ACCU-CHEK AVIVA PLUS) w/Device KIT, Test blood sugar twice daily as directed, Disp: 1 kit, Rfl: 0 .  carvedilol (COREG) 25 MG tablet, TAKE 1 TABLET TWICE DAILY  WITH  MEALS., Disp: 180 tablet, Rfl: 3 .  empagliflozin (JARDIANCE) 10 MG TABS tablet, Take 10 mg by mouth daily., Disp: 90 tablet, Rfl: 3 .  glucose blood (ACCU-CHEK AVIVA PLUS) test strip, Use to check blood  sugar twice daily.  Dx code: E11.9, E11.40, Disp: 200 each, Rfl: 3 .  insulin aspart (NOVOLOG FLEXPEN) 100 UNIT/ML FlexPen, Inject 3 Units into the skin 3 (three) times daily with meals., Disp: 15 mL, Rfl: 0 .  Insulin Glargine (LANTUS) 100 UNIT/ML Solostar Pen, Inject 20 Units into the skin daily., Disp: 3 mL, Rfl: 6 .  Lancet Device MISC, To be used with accu-check lancets, Disp: 1 each, Rfl: 0 .  Lancets Misc. (ACCU-CHEK FASTCLIX LANCET) KIT, Please use to check blood sugar, Disp: 1 kit, Rfl: 0 .  losartan (COZAAR) 50 MG tablet, TAKE 1 TABLET EVERY DAY, Disp: 90 tablet, Rfl: 2 .  metFORMIN (GLUCOPHAGE) 1000 MG tablet, TAKE 1 TABLET TWICE DAILY WITH A MEAL, Disp: 180 tablet, Rfl: 3 .  omeprazole (PRILOSEC) 20 MG capsule, TAKE 1 CAPSULE EVERY DAY, Disp: 90 capsule, Rfl: 3 .  Potassium  Citrate 15 MEQ (1620 MG) TBCR, Take 1 tablet by mouth 3 (three) times daily., Disp: 270 tablet, Rfl: 0 .  TRULICITY 1.5 BT/5.9RC SOPN, INJECT  1.5MG  (0.5ML) SUBCUTANEOUSLY ONE TIME WEEKLY, Disp: 8 pen, Rfl: 6 .  clindamycin (CLEOCIN) 300 MG capsule, TAKE 1 CAPSULE THREE TIMES DAILY, Disp: 14 capsule, Rfl: 2 .  fluconazole (DIFLUCAN) 200 MG tablet, TAKE 1 TABLET EVERY 3  DAYS., Disp: 3 tablet, Rfl: 5 Allergies  Allergen Reactions  . Aspirin Hives and Shortness Of Breath  . Ivp Dye [Iodinated Diagnostic Agents] Hives and Shortness Of Breath  . Shellfish-Derived Products Hives and Shortness Of Breath  . Lisinopril Cough    Social History   Tobacco Use  . Smoking status: Never Smoker  . Smokeless tobacco: Never Used  . Tobacco comment: no plans to start  Substance Use Topics  . Alcohol use: No    Alcohol/week: 0.0 standard drinks    Family History  Problem Relation Age of Onset  . Diabetes Mother   . Hypertension Mother   . Kidney disease Mother   . Varicose Veins Mother   . Varicose Veins Father   . Coronary artery disease Brother        CABG age 75  . Diabetes Sister   . Arthritis Sister       Review of Systems  Constitutional: negative for fatigue and weight loss Respiratory: negative for cough and wheezing Cardiovascular: negative for chest pain, fatigue and palpitations Gastrointestinal: negative for abdominal pain and change in bowel habits Musculoskeletal:negative for myalgias Neurological: negative for gait problems and tremors Behavioral/Psych: negative for abusive relationship, depression Endocrine: negative for temperature intolerance    Genitourinary:negative for abnormal menstrual periods, genital lesions, hot flashes, sexual problems and vaginal discharge Integument/breast: negative for breast lump, breast tenderness, nipple discharge and skin lesion(s)    Objective:       BP 117/80   Pulse 73   Ht '5\' 8"'  (1.727 m)   Wt 263 lb (119.3 kg)   BMI 39.99  kg/m  General:   alert  Skin:   no rash or abnormalities  Lungs:   clear to auscultation bilaterally  Heart:   regular rate and rhythm, S1, S2 normal, no murmur, click, rub or gallop  Breasts:   normal without suspicious masses, skin or nipple changes or axillary nodes  Abdomen:  normal findings: no organomegaly, soft, non-tender and no hernia  Pelvis:  External genitalia: normal general appearance Urinary system: urethral meatus normal and bladder without fullness, nontender Vaginal: normal without tenderness, induration or masses Cervix: normal appearance Adnexa: normal bimanual exam  Uterus: anteverted and non-tender, normal size   Lab Review Urine pregnancy test Labs reviewed yes Radiologic studies reviewed yes  50% of 20 min visit spent on counseling and coordination of care.   Assessment:     1. Women's annual routine gynecological examination  2. S/P hysterectomy  3. Diabetes 1.5, managed as type 2 ([place]) - managed by PCP  4. Class 3 severe obesity due to excess calories without serious comorbidity with body mass index (BMI) of 40.0 to 44.9 in adult [facility]) - program of caloric reduction, exercise and behavioral modification recommended   Plan:    Education reviewed: calcium supplements, depression evaluation, low fat, low cholesterol diet, self breast exams and weight bearing exercise. Follow up in: 1 year.   Meds ordered this encounter  Medications  . fluconazole (DIFLUCAN) 200 MG tablet    Sig: TAKE 1 TABLET EVERY 3  DAYS.    Dispense:  3 tablet    Refill:  5  . clindamycin (CLEOCIN) 300 MG capsule    Sig: TAKE 1 CAPSULE THREE TIMES DAILY    Dispense:  14 capsule    Refill:  2    Shelly Bombard MD 08-08-2018

## 2018-08-09 DIAGNOSIS — G4733 Obstructive sleep apnea (adult) (pediatric): Secondary | ICD-10-CM | POA: Diagnosis not present

## 2018-08-09 DIAGNOSIS — G43909 Migraine, unspecified, not intractable, without status migrainosus: Secondary | ICD-10-CM | POA: Diagnosis not present

## 2018-08-09 DIAGNOSIS — M25561 Pain in right knee: Secondary | ICD-10-CM | POA: Diagnosis not present

## 2018-08-09 DIAGNOSIS — E1140 Type 2 diabetes mellitus with diabetic neuropathy, unspecified: Secondary | ICD-10-CM | POA: Diagnosis not present

## 2018-08-09 DIAGNOSIS — E119 Type 2 diabetes mellitus without complications: Secondary | ICD-10-CM | POA: Diagnosis not present

## 2018-08-09 DIAGNOSIS — E11319 Type 2 diabetes mellitus with unspecified diabetic retinopathy without macular edema: Secondary | ICD-10-CM | POA: Diagnosis not present

## 2018-08-09 DIAGNOSIS — Z96652 Presence of left artificial knee joint: Secondary | ICD-10-CM | POA: Diagnosis not present

## 2018-08-09 DIAGNOSIS — K219 Gastro-esophageal reflux disease without esophagitis: Secondary | ICD-10-CM | POA: Diagnosis not present

## 2018-08-09 DIAGNOSIS — M1711 Unilateral primary osteoarthritis, right knee: Secondary | ICD-10-CM | POA: Diagnosis not present

## 2018-08-09 DIAGNOSIS — I10 Essential (primary) hypertension: Secondary | ICD-10-CM | POA: Diagnosis not present

## 2018-08-09 DIAGNOSIS — G894 Chronic pain syndrome: Secondary | ICD-10-CM | POA: Diagnosis not present

## 2018-08-09 LAB — CERVICOVAGINAL ANCILLARY ONLY
Bacterial vaginitis: NEGATIVE
Candida vaginitis: POSITIVE — AB
Trichomonas: NEGATIVE

## 2018-08-10 ENCOUNTER — Other Ambulatory Visit: Payer: Self-pay | Admitting: Obstetrics

## 2018-08-10 DIAGNOSIS — E119 Type 2 diabetes mellitus without complications: Secondary | ICD-10-CM | POA: Diagnosis not present

## 2018-08-11 DIAGNOSIS — E119 Type 2 diabetes mellitus without complications: Secondary | ICD-10-CM | POA: Diagnosis not present

## 2018-08-12 DIAGNOSIS — E119 Type 2 diabetes mellitus without complications: Secondary | ICD-10-CM | POA: Diagnosis not present

## 2018-08-23 DIAGNOSIS — G894 Chronic pain syndrome: Secondary | ICD-10-CM | POA: Diagnosis not present

## 2018-08-23 DIAGNOSIS — M9989 Other biomechanical lesions of abdomen and other regions: Secondary | ICD-10-CM | POA: Diagnosis not present

## 2018-08-23 DIAGNOSIS — I10 Essential (primary) hypertension: Secondary | ICD-10-CM | POA: Diagnosis not present

## 2018-08-23 DIAGNOSIS — R29898 Other symptoms and signs involving the musculoskeletal system: Secondary | ICD-10-CM | POA: Diagnosis not present

## 2018-08-23 DIAGNOSIS — M5442 Lumbago with sciatica, left side: Secondary | ICD-10-CM | POA: Diagnosis not present

## 2018-08-23 DIAGNOSIS — G8929 Other chronic pain: Secondary | ICD-10-CM | POA: Diagnosis not present

## 2018-08-23 DIAGNOSIS — M25561 Pain in right knee: Secondary | ICD-10-CM | POA: Diagnosis not present

## 2018-08-23 DIAGNOSIS — M48061 Spinal stenosis, lumbar region without neurogenic claudication: Secondary | ICD-10-CM | POA: Diagnosis not present

## 2018-08-23 DIAGNOSIS — M5416 Radiculopathy, lumbar region: Secondary | ICD-10-CM | POA: Diagnosis not present

## 2018-08-23 DIAGNOSIS — M6281 Muscle weakness (generalized): Secondary | ICD-10-CM | POA: Diagnosis not present

## 2018-08-23 DIAGNOSIS — M519 Unspecified thoracic, thoracolumbar and lumbosacral intervertebral disc disorder: Secondary | ICD-10-CM | POA: Diagnosis not present

## 2018-08-31 ENCOUNTER — Other Ambulatory Visit: Payer: Self-pay | Admitting: Family Medicine

## 2018-09-19 ENCOUNTER — Ambulatory Visit: Payer: Medicare HMO

## 2018-09-26 ENCOUNTER — Other Ambulatory Visit: Payer: Self-pay | Admitting: Family Medicine

## 2018-10-29 ENCOUNTER — Other Ambulatory Visit: Payer: Self-pay

## 2018-10-29 ENCOUNTER — Ambulatory Visit
Admission: RE | Admit: 2018-10-29 | Discharge: 2018-10-29 | Disposition: A | Discharge status: Home / Self Care | Payer: Medicare HMO | Source: Ambulatory Visit | Attending: Family Medicine | Admitting: Family Medicine

## 2018-10-29 DIAGNOSIS — Z1231 Encounter for screening mammogram for malignant neoplasm of breast: Secondary | ICD-10-CM

## 2019-02-22 ENCOUNTER — Other Ambulatory Visit: Payer: Self-pay | Admitting: Family Medicine

## 2019-02-22 ENCOUNTER — Other Ambulatory Visit: Payer: Self-pay | Admitting: Obstetrics

## 2019-02-22 DIAGNOSIS — B3731 Acute candidiasis of vulva and vagina: Secondary | ICD-10-CM

## 2019-02-22 DIAGNOSIS — B373 Candidiasis of vulva and vagina: Secondary | ICD-10-CM

## 2019-04-11 ENCOUNTER — Other Ambulatory Visit: Payer: Self-pay | Admitting: Obstetrics

## 2019-04-11 ENCOUNTER — Other Ambulatory Visit: Payer: Self-pay | Admitting: Family Medicine

## 2019-04-11 DIAGNOSIS — B373 Candidiasis of vulva and vagina: Secondary | ICD-10-CM

## 2019-04-11 DIAGNOSIS — B3731 Acute candidiasis of vulva and vagina: Secondary | ICD-10-CM

## 2019-04-17 ENCOUNTER — Other Ambulatory Visit: Payer: Self-pay | Admitting: Obstetrics

## 2019-04-17 DIAGNOSIS — B3731 Acute candidiasis of vulva and vagina: Secondary | ICD-10-CM

## 2019-04-17 DIAGNOSIS — B373 Candidiasis of vulva and vagina: Secondary | ICD-10-CM

## 2019-06-08 ENCOUNTER — Other Ambulatory Visit: Payer: Self-pay | Admitting: Obstetrics

## 2019-06-08 DIAGNOSIS — B373 Candidiasis of vulva and vagina: Secondary | ICD-10-CM

## 2019-06-08 DIAGNOSIS — B3731 Acute candidiasis of vulva and vagina: Secondary | ICD-10-CM

## 2019-06-17 ENCOUNTER — Other Ambulatory Visit: Payer: Self-pay | Admitting: Obstetrics

## 2019-06-17 DIAGNOSIS — B3731 Acute candidiasis of vulva and vagina: Secondary | ICD-10-CM

## 2019-06-21 ENCOUNTER — Other Ambulatory Visit: Payer: Self-pay | Admitting: Obstetrics

## 2019-06-21 DIAGNOSIS — B3731 Acute candidiasis of vulva and vagina: Secondary | ICD-10-CM

## 2019-06-29 ENCOUNTER — Other Ambulatory Visit: Payer: Self-pay | Admitting: Obstetrics

## 2019-06-29 DIAGNOSIS — B3731 Acute candidiasis of vulva and vagina: Secondary | ICD-10-CM

## 2019-07-05 ENCOUNTER — Ambulatory Visit (INDEPENDENT_AMBULATORY_CARE_PROVIDER_SITE_OTHER): Payer: Medicare HMO | Admitting: Obstetrics

## 2019-07-05 ENCOUNTER — Encounter: Payer: Self-pay | Admitting: Obstetrics

## 2019-07-05 ENCOUNTER — Other Ambulatory Visit: Payer: Self-pay

## 2019-07-05 ENCOUNTER — Other Ambulatory Visit (HOSPITAL_COMMUNITY)
Admission: RE | Admit: 2019-07-05 | Discharge: 2019-07-05 | Disposition: A | Discharge status: Home / Self Care | Payer: Medicare HMO | Source: Ambulatory Visit | Attending: Obstetrics | Admitting: Obstetrics

## 2019-07-05 VITALS — BP 134/86 | HR 76 | Wt 261.0 lb

## 2019-07-05 DIAGNOSIS — N898 Other specified noninflammatory disorders of vagina: Secondary | ICD-10-CM | POA: Insufficient documentation

## 2019-07-05 DIAGNOSIS — B3731 Acute candidiasis of vulva and vagina: Secondary | ICD-10-CM

## 2019-07-05 DIAGNOSIS — Z90710 Acquired absence of both cervix and uterus: Secondary | ICD-10-CM

## 2019-07-05 DIAGNOSIS — B373 Candidiasis of vulva and vagina: Secondary | ICD-10-CM | POA: Diagnosis not present

## 2019-07-05 DIAGNOSIS — L089 Local infection of the skin and subcutaneous tissue, unspecified: Secondary | ICD-10-CM | POA: Diagnosis not present

## 2019-07-05 DIAGNOSIS — B9689 Other specified bacterial agents as the cause of diseases classified elsewhere: Secondary | ICD-10-CM

## 2019-07-05 DIAGNOSIS — E139 Other specified diabetes mellitus without complications: Secondary | ICD-10-CM

## 2019-07-05 DIAGNOSIS — Z6841 Body Mass Index (BMI) 40.0 and over, adult: Secondary | ICD-10-CM

## 2019-07-05 MED ORDER — CLINDAMYCIN HCL 300 MG PO CAPS: ORAL_CAPSULE | ORAL | 2 refills | Status: DC

## 2019-07-05 MED ORDER — FLUCONAZOLE 200 MG PO TABS: ORAL_TABLET | ORAL | 2 refills | Status: DC

## 2019-07-05 NOTE — Progress Notes (Signed)
Patient ID: Kathryn Crosby, female   DOB: 1960-04-27, 59 y.o.   MRN: [id number]  Chief Complaint  Patient presents with  . Gynecologic Exam    HPI Kathryn Crosby is a 59 y.o. female.  Complains of vaginal discharge with itching. HPI  Past Medical History:  Diagnosis Date  . Arthritis   . Chronic headaches   . Diabetes mellitus   . GERD (gastroesophageal reflux disease)   . Hyperlipidemia   . Hypertension   . Kidney stones   . Migraine   . Nephrolithiasis   . Varicose veins 03-30-15   Bilateral Leg    Past Surgical History:  Procedure Laterality Date  . ABDOMINAL HYSTERECTOMY    . BREAST BIOPSY Bilateral    benign  . left ankle fracture    . LITHOTRIPSY    . REPLACEMENT TOTAL KNEE Left   . ROTATOR CUFF REPAIR    . TONSILLECTOMY      Family History  Problem Relation Age of Onset  . Diabetes Mother   . Hypertension Mother   . Kidney disease Mother   . Varicose Veins Mother   . Varicose Veins Father   . Coronary artery disease Brother        CABG age 70  . Diabetes Sister   . Arthritis Sister     Social History Social History   Tobacco Use  . Smoking status: Never Smoker  . Smokeless tobacco: Never Used  . Tobacco comment: no plans to start  Substance Use Topics  . Alcohol use: No    Alcohol/week: 0.0 standard drinks  . Drug use: No    Allergies  Allergen Reactions  . Aspirin Hives and Shortness Of Breath  . Ivp Dye [Iodinated Diagnostic Agents] Hives and Shortness Of Breath  . Shellfish-Derived Products Hives and Shortness Of Breath  . Lisinopril Cough    Current Outpatient Medications  Medication Sig Dispense Refill  . amLODipine (NORVASC) 10 MG tablet TAKE 1 TABLET EVERY DAY 90 tablet 3  . atorvastatin (LIPITOR) 40 MG tablet TAKE 1 TABLET AT BEDTIME 90 tablet 2  . carvedilol (COREG) 25 MG tablet TAKE 1 TABLET TWICE DAILY  WITH  MEALS. 180 tablet 3  . Insulin Glargine (LANTUS) 100 UNIT/ML Solostar Pen Inject 20 Units into the skin daily. 3 mL  6  . JARDIANCE 10 MG TABS tablet TAKE 1 TABLET EVERY DAY 90 tablet 3  . losartan (COZAAR) 50 MG tablet TAKE 1 TABLET EVERY DAY 90 tablet 2  . metFORMIN (GLUCOPHAGE) 1000 MG tablet TAKE 1 TABLET TWICE DAILY WITH A MEAL 180 tablet 3  . omeprazole (PRILOSEC) 20 MG capsule TAKE 1 CAPSULE EVERY DAY 90 capsule 3  . Potassium Citrate 15 MEQ (1620 MG) TBCR Take 1 tablet by mouth 3 (three) times daily. 656 tablet 0  . TRULICITY 1.5 CL/2.7NT SOPN INJECT  1.5MG  (0.5ML) SUBCUTANEOUSLY ONE TIME WEEKLY 8 pen 6  . ACCU-CHEK AVIVA PLUS test strip CHECK BLOOD SUGAR TWICE DAILY 200 strip 3  . ACCU-CHEK SOFTCLIX LANCETS lancets Use to check blood sugar twice daily.  Dx code: E11.9, E11.40 200 each 3  . Alcohol Swabs (B-D SINGLE USE SWABS REGULAR) PADS USE TWICE DAILY  OR AS DIRECTED 200 each 6  . B-D ULTRAFINE III SHORT PEN 31G X 8 MM MISC USE WITH INSULIN PEN 180 each 5  . Blood Glucose Calibration (ACCU-CHEK AVIVA) SOLN Use as directed. 3 each 0  . Blood Glucose Monitoring Suppl (ACCU-CHEK AVIVA PLUS) w/Device KIT Test blood  sugar twice daily as directed 1 kit 0  . clindamycin (CLEOCIN) 300 MG capsule TAKE 1 CAPSULE THREE TIMES DAILY 14 capsule 2  . fluconazole (DIFLUCAN) 200 MG tablet TTAKE 1 TABLET EVERY 3  DAYS. 3 tablet 2  . insulin aspart (NOVOLOG FLEXPEN) 100 UNIT/ML FlexPen Inject 3 Units into the skin 3 (three) times daily with meals. 15 mL 0  . Lancet Device MISC To be used with accu-check lancets 1 each 0  . Lancets Misc. (ACCU-CHEK FASTCLIX LANCET) KIT Please use to check blood sugar 1 kit 0   No current facility-administered medications for this visit.    Review of Systems Review of Systems Constitutional: negative for fatigue and weight loss Respiratory: negative for cough and wheezing Cardiovascular: negative for chest pain, fatigue and palpitations Gastrointestinal: negative for abdominal pain and change in bowel habits Genitourinary: positive for vaginal discharge with  itching Integument/breast: negative for nipple discharge Musculoskeletal:negative for myalgias Neurological: negative for gait problems and tremors Behavioral/Psych: negative for abusive relationship, depression Endocrine: negative for temperature intolerance      Blood pressure 134/86, pulse 76, weight 261 lb (118.4 kg).  Physical Exam Physical Exam           General: Alert and no distress Abdomen:  normal findings: no organomegaly, soft, non-tender and no hernia  Pelvis:  External genitalia: normal general appearance Urinary system: urethral meatus normal and bladder without fullness, nontender Vaginal: normal without tenderness, induration or masses Cervix: absent Adnexa: normal bimanual exam Uterus: absent    50% of 15 min visit spent on counseling and coordination of care.   Data Reviewed Wet Prep  Assessment     1. Vaginal discharge Rx: - Cervicovaginal ancillary only( [place])  2. Candidiasis of vagina Rx: - fluconazole (DIFLUCAN) 200 MG tablet; TTAKE 1 TABLET EVERY 3  DAYS.  Dispense: 3 tablet; Refill: 2  3. Skin infection, bacterial Rx: - clindamycin (CLEOCIN) 300 MG capsule; TAKE 1 CAPSULE THREE TIMES DAILY  Dispense: 21 capsule; Refill: 2  4. S/P hysterectomy  5. Diabetes 1.5, managed as type 2 ([place]) - managed by PCP  6. Class 3 severe obesity due to excess calories without serious comorbidity with body mass index (BMI) of 40.0 to 44.9 in adult [facility]) - program od caloric reduction, exercise and behavioral modification recommended  Plan   Follow up prn  Shelly Bombard, MD 07/05/2019 11:12 AM

## 2019-07-08 ENCOUNTER — Other Ambulatory Visit: Payer: Self-pay | Admitting: Obstetrics

## 2019-07-08 DIAGNOSIS — B3731 Acute candidiasis of vulva and vagina: Secondary | ICD-10-CM

## 2019-07-08 LAB — CERVICOVAGINAL ANCILLARY ONLY
Bacterial Vaginitis (gardnerella): NEGATIVE
Candida Glabrata: NEGATIVE
Candida Vaginitis: POSITIVE — AB
Chlamydia: NEGATIVE
Comment: NEGATIVE
Comment: NEGATIVE
Comment: NEGATIVE
Comment: NEGATIVE
Comment: NEGATIVE
Comment: NORMAL
Neisseria Gonorrhea: NEGATIVE
Trichomonas: NEGATIVE

## 2019-07-08 MED ORDER — FLUCONAZOLE 150 MG PO TABS: 150.0000 mg | ORAL_TABLET | Freq: Once | ORAL | 0 refills | Status: AC

## 2019-07-09 ENCOUNTER — Other Ambulatory Visit: Payer: Self-pay | Admitting: Obstetrics

## 2019-07-09 DIAGNOSIS — B3731 Acute candidiasis of vulva and vagina: Secondary | ICD-10-CM

## 2019-08-13 ENCOUNTER — Ambulatory Visit: Payer: Medicare HMO | Admitting: Obstetrics

## 2019-08-15 ENCOUNTER — Other Ambulatory Visit: Payer: Self-pay | Admitting: Obstetrics

## 2019-08-15 DIAGNOSIS — L089 Local infection of the skin and subcutaneous tissue, unspecified: Secondary | ICD-10-CM

## 2019-08-15 DIAGNOSIS — B9689 Other specified bacterial agents as the cause of diseases classified elsewhere: Secondary | ICD-10-CM

## 2019-08-31 ENCOUNTER — Other Ambulatory Visit: Payer: Self-pay | Admitting: Obstetrics

## 2019-08-31 DIAGNOSIS — B9689 Other specified bacterial agents as the cause of diseases classified elsewhere: Secondary | ICD-10-CM

## 2019-08-31 DIAGNOSIS — L089 Local infection of the skin and subcutaneous tissue, unspecified: Secondary | ICD-10-CM

## 2019-09-17 ENCOUNTER — Other Ambulatory Visit: Payer: Self-pay | Admitting: Obstetrics

## 2019-09-17 DIAGNOSIS — B373 Candidiasis of vulva and vagina: Secondary | ICD-10-CM

## 2019-09-17 DIAGNOSIS — B3731 Acute candidiasis of vulva and vagina: Secondary | ICD-10-CM

## 2019-09-25 ENCOUNTER — Other Ambulatory Visit: Payer: Self-pay | Admitting: Family Medicine

## 2019-09-25 DIAGNOSIS — Z1231 Encounter for screening mammogram for malignant neoplasm of breast: Secondary | ICD-10-CM

## 2019-09-29 ENCOUNTER — Other Ambulatory Visit: Payer: Self-pay | Admitting: Family Medicine

## 2019-10-23 ENCOUNTER — Other Ambulatory Visit: Payer: Self-pay | Admitting: Obstetrics

## 2019-10-23 DIAGNOSIS — B3731 Acute candidiasis of vulva and vagina: Secondary | ICD-10-CM

## 2019-10-29 ENCOUNTER — Ambulatory Visit: Payer: Medicare HMO

## 2019-10-30 ENCOUNTER — Other Ambulatory Visit: Payer: Self-pay

## 2019-10-30 ENCOUNTER — Ambulatory Visit
Admission: RE | Admit: 2019-10-30 | Discharge: 2019-10-30 | Disposition: A | Discharge status: Home / Self Care | Payer: Medicare HMO | Source: Ambulatory Visit | Attending: Family Medicine | Admitting: Family Medicine

## 2019-10-30 DIAGNOSIS — Z1231 Encounter for screening mammogram for malignant neoplasm of breast: Secondary | ICD-10-CM

## 2019-12-17 IMAGING — MG DIGITAL SCREENING BILATERAL MAMMOGRAM WITH TOMO AND CAD
8 series · 8 of 24 positions shown · non-contrast
Comparison: Previous exam(s).

CLINICAL DATA: Screening.

EXAM:
DIGITAL SCREENING BILATERAL MAMMOGRAM WITH TOMO AND CAD

[L CC synth-2D]
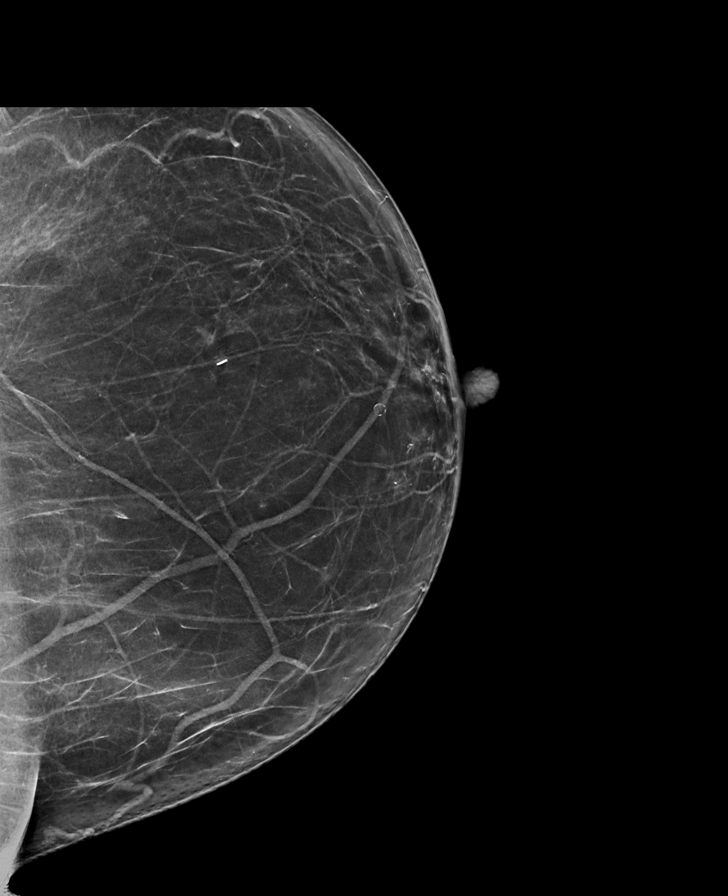

[R CC synth-2D]
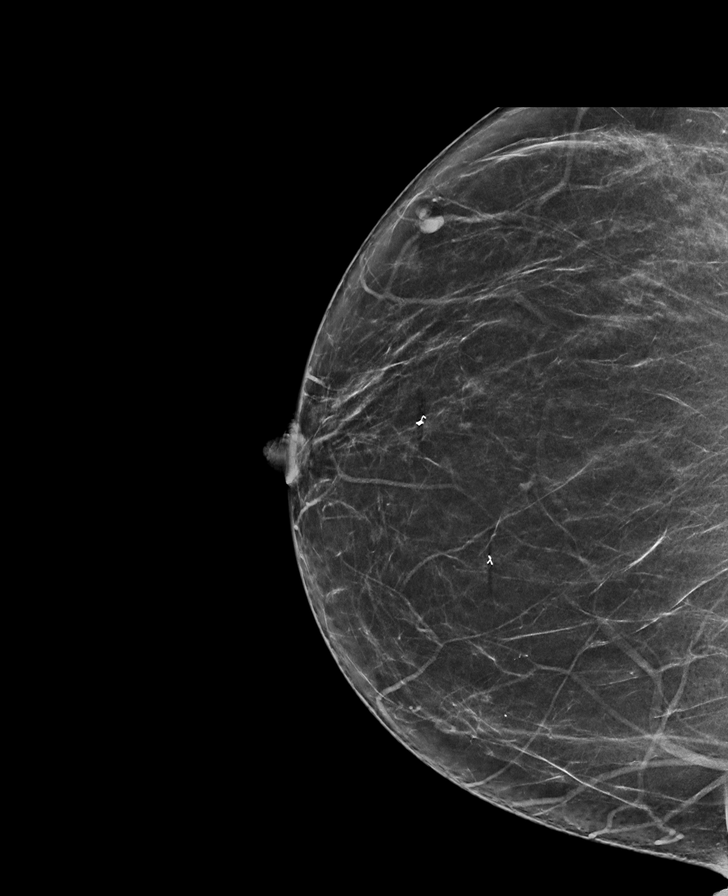

[R MLO synth-2D]
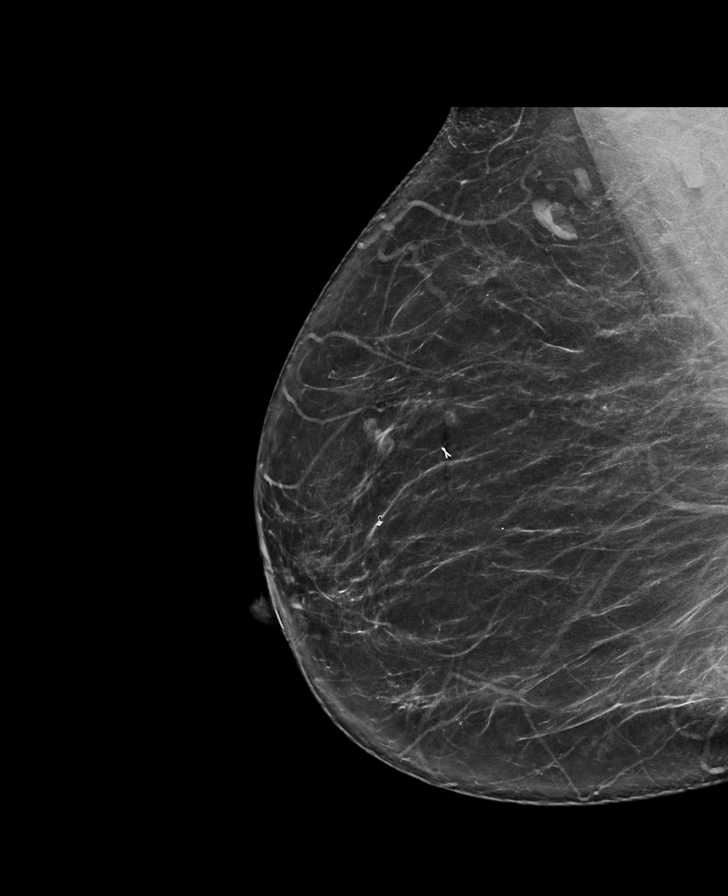

[L MLO synth-2D]
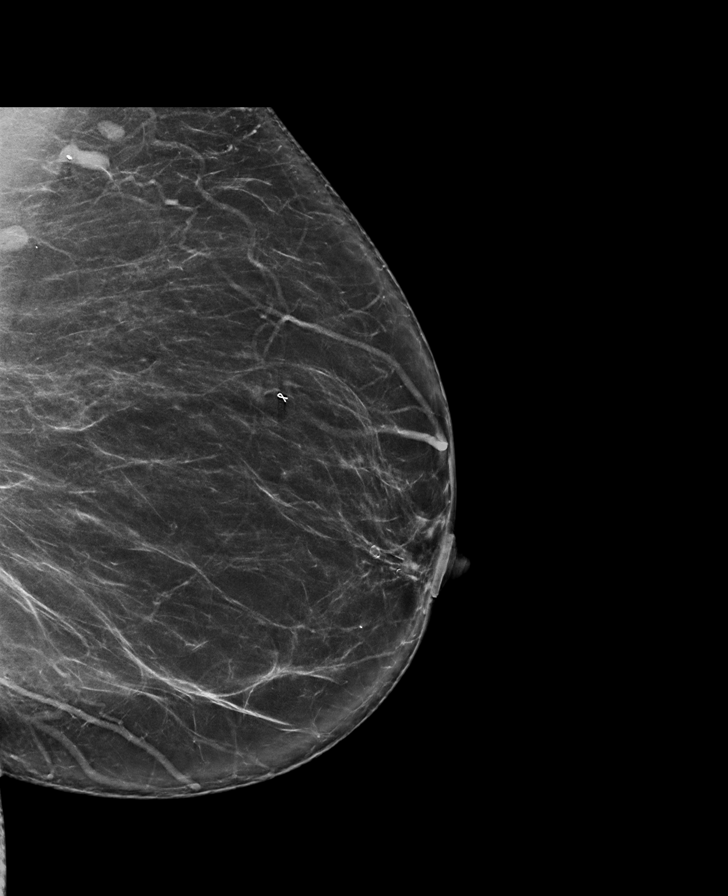

[L CC tomo · tomo slice 40/79.0]
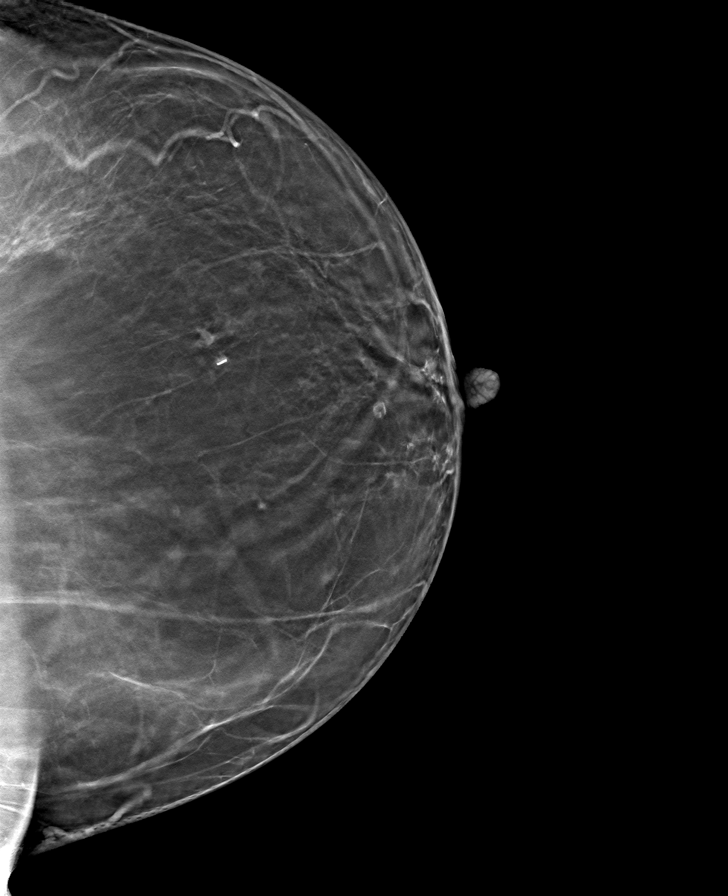

[R MLO tomo · tomo slice 43/85.0]
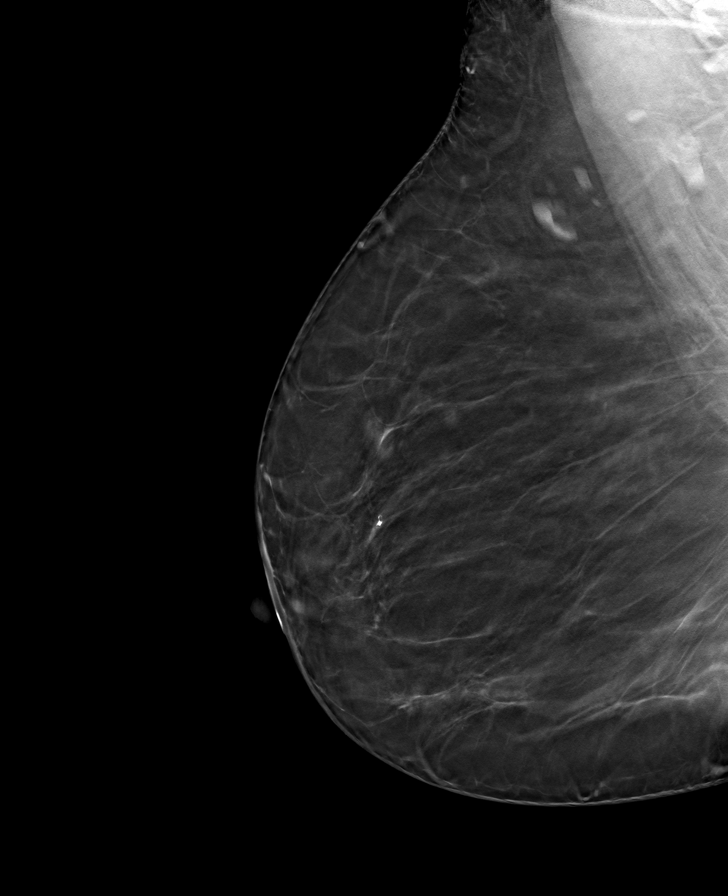

[L MLO tomo · tomo slice 45/88.0]
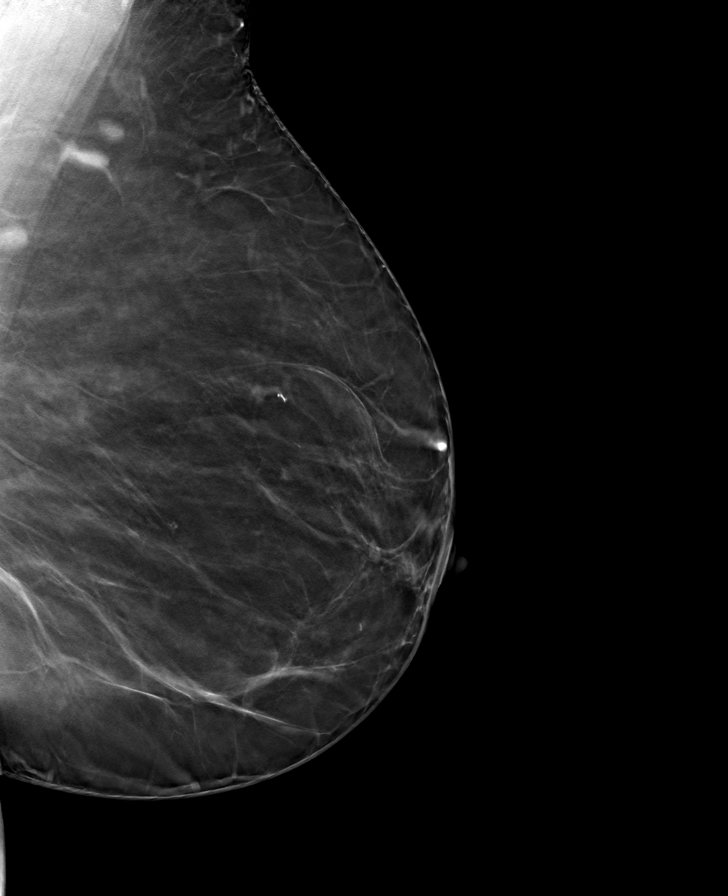

[R CC tomo · tomo slice 37/72.0]
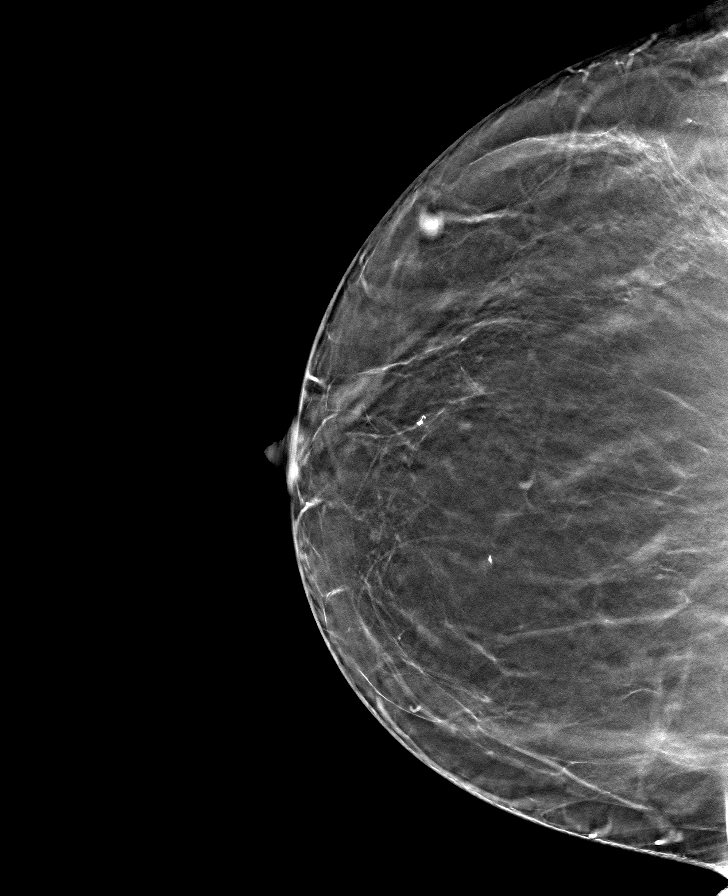

[8 of 24 positions shown; findings below may reference images not displayed]

ACR Breast Density Category b: There are scattered areas of
fibroglandular density.
FINDINGS: There are no findings suspicious for malignancy. Images were
processed with CAD.
IMPRESSION: No mammographic evidence of malignancy. A result letter of this
screening mammogram will be mailed directly to the patient.

RECOMMENDATION:
Screening mammogram in one year. (Code:[id number])

BI-RADS CATEGORY  1: Negative.

## 2020-04-28 ENCOUNTER — Other Ambulatory Visit: Payer: Self-pay | Admitting: Family Medicine

## 2020-09-25 ENCOUNTER — Other Ambulatory Visit: Payer: Self-pay | Admitting: Family Medicine

## 2020-09-25 DIAGNOSIS — Z1231 Encounter for screening mammogram for malignant neoplasm of breast: Secondary | ICD-10-CM

## 2020-09-28 ENCOUNTER — Ambulatory Visit (INDEPENDENT_AMBULATORY_CARE_PROVIDER_SITE_OTHER): Payer: Medicare HMO | Admitting: Obstetrics

## 2020-09-28 ENCOUNTER — Encounter: Payer: Self-pay | Admitting: Obstetrics

## 2020-09-28 ENCOUNTER — Other Ambulatory Visit: Payer: Self-pay

## 2020-09-28 ENCOUNTER — Other Ambulatory Visit (HOSPITAL_COMMUNITY)
Admission: RE | Admit: 2020-09-28 | Discharge: 2020-09-28 | Disposition: A | Discharge status: Home / Self Care | Payer: Medicare HMO | Source: Ambulatory Visit | Attending: Obstetrics | Admitting: Obstetrics

## 2020-09-28 VITALS — BP 121/85 | HR 76 | Ht 68.0 in | Wt 267.0 lb

## 2020-09-28 DIAGNOSIS — N898 Other specified noninflammatory disorders of vagina: Secondary | ICD-10-CM | POA: Diagnosis present

## 2020-09-28 DIAGNOSIS — Z6841 Body Mass Index (BMI) 40.0 and over, adult: Secondary | ICD-10-CM

## 2020-09-28 DIAGNOSIS — Z01419 Encounter for gynecological examination (general) (routine) without abnormal findings: Secondary | ICD-10-CM

## 2020-09-28 DIAGNOSIS — E139 Other specified diabetes mellitus without complications: Secondary | ICD-10-CM

## 2020-09-28 DIAGNOSIS — B373 Candidiasis of vulva and vagina: Secondary | ICD-10-CM

## 2020-09-28 DIAGNOSIS — B3731 Acute candidiasis of vulva and vagina: Secondary | ICD-10-CM

## 2020-09-28 MED ORDER — FLUCONAZOLE 200 MG PO TABS: ORAL_TABLET | ORAL | 4 refills | Status: DC

## 2020-09-28 NOTE — Progress Notes (Signed)
Subjective:        Kathryn Crosby is a 60 y.o. female here for a routine exam.  Current complaints: Vaginal discharge with itching.    Personal health questionnaire:  Is patient Ashkenazi Jewish, have a family history of breast and/or ovarian cancer: no Is there a family history of uterine cancer diagnosed at age < 25, gastrointestinal cancer, urinary tract cancer, family member who is a Field seismologist syndrome-associated carrier: no Is the patient overweight and hypertensive, family history of diabetes, personal history of gestational diabetes, preeclampsia or PCOS: yes Is patient over 64, have PCOS,  family history of premature CHD under age 58, diabetes, smoke, have hypertension or peripheral artery disease:  no At any time, has a partner hit, kicked or otherwise hurt or frightened you?: no Over the past 2 weeks, have you felt down, depressed or hopeless?: no Over the past 2 weeks, have you felt little interest or pleasure in doing things?:no   Gynecologic History No LMP recorded. Patient has had a hysterectomy. Contraception: post menopausal status Last Pap: 2010. Results were: normal Last mammogram: 10-30-2019. Results were: normal  Obstetric History OB History  Gravida Para Term Preterm AB Living  '3 3 3     3  ' SAB IAB Ectopic Multiple Live Births          3    # Outcome Date GA Lbr Len/2nd Weight Sex Delivery Anes PTL Lv  3 Term 09/16/81   8 lb (3.629 kg) F Vag-Spont EPI  LIV  2 Term 11/29/77   7 lb 6 oz (3.345 kg) M Vag-Spont EPI  LIV  1 Term 02/29/76   7 lb 9 oz (3.43 kg) M Vag-Spont EPI  LIV    Past Medical History:  Diagnosis Date   Arthritis    Chronic headaches    Diabetes mellitus    GERD (gastroesophageal reflux disease)    Hyperlipidemia    Hypertension    Kidney stones    Migraine    Nephrolithiasis    Varicose veins 03-30-15   Bilateral Leg    Past Surgical History:  Procedure Laterality Date   ABDOMINAL HYSTERECTOMY     BREAST BIOPSY Bilateral     benign   left ankle fracture     LITHOTRIPSY     REPLACEMENT TOTAL KNEE Left    ROTATOR CUFF REPAIR     TONSILLECTOMY     WRIST SURGERY       Current Outpatient Medications:    amLODipine (NORVASC) 10 MG tablet, TAKE 1 TABLET EVERY DAY, Disp: 90 tablet, Rfl: 3   atorvastatin (LIPITOR) 40 MG tablet, TAKE 1 TABLET AT BEDTIME, Disp: 90 tablet, Rfl: 2   carvedilol (COREG) 25 MG tablet, TAKE 1 TABLET TWICE DAILY  WITH  MEALS., Disp: 180 tablet, Rfl: 3   clindamycin (CLEOCIN) 300 MG capsule, TAKE 1 CAPSULE BY MOUTH THREE TIMES DAILY, Disp: 14 capsule, Rfl: 0   insulin aspart (NOVOLOG FLEXPEN) 100 UNIT/ML FlexPen, Inject 3 Units into the skin 3 (three) times daily with meals., Disp: 15 mL, Rfl: 0   Insulin Glargine (LANTUS) 100 UNIT/ML Solostar Pen, Inject 20 Units into the skin daily., Disp: 3 mL, Rfl: 6   JARDIANCE 10 MG TABS tablet, TAKE 1 TABLET EVERY DAY, Disp: 90 tablet, Rfl: 3   losartan (COZAAR) 50 MG tablet, TAKE 1 TABLET EVERY DAY, Disp: 90 tablet, Rfl: 2   metFORMIN (GLUCOPHAGE) 1000 MG tablet, TAKE 1 TABLET TWICE DAILY WITH A MEAL, Disp: 180 tablet, Rfl: 3  omeprazole (PRILOSEC) 20 MG capsule, TAKE 1 CAPSULE EVERY DAY, Disp: 90 capsule, Rfl: 3   Potassium Citrate 15 MEQ (1620 MG) TBCR, Take 1 tablet by mouth 3 (three) times daily., Disp: 270 tablet, Rfl: 0   TRULICITY 1.5 HK/7.4QV SOPN, INJECT  1.5MG  (0.5ML) SUBCUTANEOUSLY ONE TIME WEEKLY, Disp: 8 pen, Rfl: 6   ACCU-CHEK AVIVA PLUS test strip, CHECK BLOOD SUGAR TWICE DAILY, Disp: 200 strip, Rfl: 3   ACCU-CHEK SOFTCLIX LANCETS lancets, Use to check blood sugar twice daily.  Dx code: E11.9, E11.40, Disp: 200 each, Rfl: 3   Alcohol Swabs (B-D SINGLE USE SWABS REGULAR) PADS, USE TWICE DAILY  OR AS DIRECTED, Disp: 200 each, Rfl: 6   B-D ULTRAFINE III SHORT PEN 31G X 8 MM MISC, USE WITH INSULIN PEN, Disp: 180 each, Rfl: 5   Blood Glucose Calibration (ACCU-CHEK AVIVA) SOLN, Use as directed., Disp: 3 each, Rfl: 0   Blood Glucose Monitoring  Suppl (ACCU-CHEK AVIVA PLUS) w/Device KIT, Test blood sugar twice daily as directed, Disp: 1 kit, Rfl: 0   fluconazole (DIFLUCAN) 200 MG tablet, TAKE 1 TABLET EVERY 3 DAYS, Disp: 3 tablet, Rfl: 2   Lancet Device MISC, To be used with accu-check lancets, Disp: 1 each, Rfl: 0   Lancets Misc. (ACCU-CHEK FASTCLIX LANCET) KIT, Please use to check blood sugar, Disp: 1 kit, Rfl: 0 Allergies  Allergen Reactions   Aspirin Hives and Shortness Of Breath   Ivp Dye [Iodinated Diagnostic Agents] Hives and Shortness Of Breath   Shellfish-Derived Products Hives and Shortness Of Breath   Lisinopril Cough    Social History   Tobacco Use   Smoking status: Never   Smokeless tobacco: Never   Tobacco comments:    no plans to start  Substance Use Topics   Alcohol use: No    Alcohol/week: 0.0 standard drinks    Family History  Problem Relation Age of Onset   Diabetes Mother    Hypertension Mother    Kidney disease Mother    Varicose Veins Mother    Varicose Veins Father    Coronary artery disease Brother        CABG age 72   Diabetes Sister    Arthritis Sister       Review of Systems  Constitutional: negative for fatigue and weight loss Respiratory: negative for cough and wheezing Cardiovascular: negative for chest pain, fatigue and palpitations Gastrointestinal: negative for abdominal pain and change in bowel habits Musculoskeletal:negative for myalgias Neurological: negative for gait problems and tremors Behavioral/Psych: negative for abusive relationship, depression Endocrine: negative for temperature intolerance    Genitourinary: positive for vaginal discharge and itching.  negative for abnormal menstrual periods, genital lesions, hot flashes, sexual problems  Integument/breast: negative for breast lump, breast tenderness, nipple discharge and skin lesion(s)    Objective:       Ht '5\' 8"'  (1.727 m)   Wt 267 lb (121.1 kg)   BMI 40.60 kg/m  General:   Alert and no distress  Skin:    no rash or abnormalities  Lungs:   clear to auscultation bilaterally  Heart:   regular rate and rhythm, S1, S2 normal, no murmur, click, rub or gallop  Breasts:   normal without suspicious masses, skin or nipple changes or axillary nodes  Abdomen:  normal findings: no organomegaly, soft, non-tender and no hernia  Pelvis:  External genitalia: normal general appearance Urinary system: urethral meatus normal and bladder without fullness, nontender Vaginal: normal without tenderness, induration or masses Cervix:  normal appearance Adnexa: normal bimanual exam Uterus: anteverted and non-tender, normal size   Lab Review Urine pregnancy test Labs reviewed yes Radiologic studies reviewed yes  I have spent a total of 20 minutes of face-to-face time, excluding clinical staff time, reviewing notes and preparing to see patient, ordering tests and/or medications, and counseling the patient.   Assessment:   1. Encounter for annual routine gynecological examination - doing well  2. Vaginal discharge Rx: - Cervicovaginal ancillary only( [facility])  3. Candidiasis of vagina Rx: - fluconazole (DIFLUCAN) 200 MG tablet; TAKE 1 TABLET EVERY 3 DAYS  Dispense: 3 tablet; Refill: 4  4. Class 3 severe obesity due to excess calories without serious comorbidity with body mass index (BMI) of 40.0 to 44.9 in adult (HCC) - weight reduction with the aid of dietary changes, exercise and behavioral modification recommended  5. 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 Mammogram ordered. Follow up in: 1 year.     Shelly Bombard, MD 09/28/2020 10:51 AM

## 2020-09-29 ENCOUNTER — Other Ambulatory Visit: Payer: Self-pay | Admitting: Obstetrics

## 2020-09-29 LAB — CERVICOVAGINAL ANCILLARY ONLY
Bacterial Vaginitis (gardnerella): NEGATIVE
Candida Glabrata: NEGATIVE
Candida Vaginitis: POSITIVE — AB
Comment: NEGATIVE
Comment: NEGATIVE
Comment: NEGATIVE
Comment: NEGATIVE
Trichomonas: NEGATIVE

## 2020-09-29 NOTE — Progress Notes (Signed)
TC to patient to inform of yeast infection diagnosis and RX for Diflucan sent in by Dr. Jodi Mourning. Patient verbalized understanding.

## 2020-11-03 ENCOUNTER — Other Ambulatory Visit: Payer: Self-pay

## 2020-11-03 ENCOUNTER — Ambulatory Visit
Admission: RE | Admit: 2020-11-03 | Discharge: 2020-11-03 | Disposition: A | Discharge status: Home / Self Care | Payer: Medicare HMO | Source: Ambulatory Visit | Attending: Family Medicine | Admitting: Family Medicine

## 2020-11-03 DIAGNOSIS — Z1231 Encounter for screening mammogram for malignant neoplasm of breast: Secondary | ICD-10-CM

## 2020-11-23 ENCOUNTER — Other Ambulatory Visit: Payer: Self-pay | Admitting: *Deleted

## 2020-11-23 ENCOUNTER — Telehealth: Payer: Self-pay | Admitting: *Deleted

## 2020-11-23 DIAGNOSIS — B3731 Acute candidiasis of vulva and vagina: Secondary | ICD-10-CM

## 2020-11-23 MED ORDER — FLUCONAZOLE 200 MG PO TABS: ORAL_TABLET | ORAL | 4 refills | Status: DC

## 2020-11-23 NOTE — Telephone Encounter (Signed)
TC to patient regarding Diflucan RX request from [facility]. Patient confirmed request and on going treatment plan with Dr. Jodi Mourning. Will take as previously directed by Dr. Jodi Mourning and will follow up with annual exam in August. RX sent with 4 refills.

## 2021-01-16 ENCOUNTER — Other Ambulatory Visit: Payer: Self-pay | Admitting: Obstetrics

## 2021-01-16 DIAGNOSIS — B3731 Acute candidiasis of vulva and vagina: Secondary | ICD-10-CM

## 2021-03-11 ENCOUNTER — Other Ambulatory Visit: Payer: Self-pay | Admitting: Obstetrics

## 2021-03-11 DIAGNOSIS — B3731 Acute candidiasis of vulva and vagina: Secondary | ICD-10-CM

## 2021-04-29 ENCOUNTER — Other Ambulatory Visit: Payer: Self-pay | Admitting: Obstetrics

## 2021-04-29 DIAGNOSIS — B3731 Acute candidiasis of vulva and vagina: Secondary | ICD-10-CM

## 2021-06-25 ENCOUNTER — Other Ambulatory Visit: Payer: Self-pay | Admitting: Obstetrics

## 2021-06-25 DIAGNOSIS — B3731 Acute candidiasis of vulva and vagina: Secondary | ICD-10-CM

## 2021-08-18 ENCOUNTER — Telehealth: Payer: Self-pay | Admitting: *Deleted

## 2021-08-18 NOTE — Telephone Encounter (Signed)
TC from [facility] regarding RX for incontinence supplies. Advised pt needs annual exam and potential referral for cont'd incontinence. [facility] representative will inform pt.

## 2021-08-25 ENCOUNTER — Other Ambulatory Visit: Payer: Self-pay | Admitting: Obstetrics

## 2021-08-25 DIAGNOSIS — B3731 Acute candidiasis of vulva and vagina: Secondary | ICD-10-CM

## 2021-09-27 ENCOUNTER — Other Ambulatory Visit: Payer: Self-pay | Admitting: Family Medicine

## 2021-09-27 DIAGNOSIS — Z1231 Encounter for screening mammogram for malignant neoplasm of breast: Secondary | ICD-10-CM

## 2021-11-03 ENCOUNTER — Ambulatory Visit
Admission: RE | Admit: 2021-11-03 | Discharge: 2021-11-03 | Disposition: A | Discharge status: Home / Self Care | Payer: Medicare HMO | Source: Ambulatory Visit | Attending: Family Medicine | Admitting: Family Medicine

## 2021-11-03 DIAGNOSIS — Z1231 Encounter for screening mammogram for malignant neoplasm of breast: Secondary | ICD-10-CM

## 2021-11-04 ENCOUNTER — Ambulatory Visit: Payer: Medicare HMO

## 2021-12-01 ENCOUNTER — Ambulatory Visit: Payer: Medicare HMO | Admitting: Obstetrics

## 2022-01-26 ENCOUNTER — Emergency Department (HOSPITAL_BASED_OUTPATIENT_CLINIC_OR_DEPARTMENT_OTHER): Payer: Medicare HMO

## 2022-01-26 ENCOUNTER — Emergency Department (HOSPITAL_BASED_OUTPATIENT_CLINIC_OR_DEPARTMENT_OTHER)
Admission: EM | Admit: 2022-01-26 | Discharge: 2022-01-26 | Disposition: A | Discharge status: Home / Self Care | Payer: Medicare HMO | Attending: Emergency Medicine | Admitting: Emergency Medicine

## 2022-01-26 ENCOUNTER — Other Ambulatory Visit: Payer: Self-pay

## 2022-01-26 DIAGNOSIS — X58XXXA Exposure to other specified factors, initial encounter: Secondary | ICD-10-CM | POA: Diagnosis not present

## 2022-01-26 DIAGNOSIS — Z7984 Long term (current) use of oral hypoglycemic drugs: Secondary | ICD-10-CM | POA: Insufficient documentation

## 2022-01-26 DIAGNOSIS — E119 Type 2 diabetes mellitus without complications: Secondary | ICD-10-CM | POA: Insufficient documentation

## 2022-01-26 DIAGNOSIS — I10 Essential (primary) hypertension: Secondary | ICD-10-CM | POA: Insufficient documentation

## 2022-01-26 DIAGNOSIS — M542 Cervicalgia: Secondary | ICD-10-CM | POA: Diagnosis not present

## 2022-01-26 DIAGNOSIS — Z79899 Other long term (current) drug therapy: Secondary | ICD-10-CM | POA: Diagnosis not present

## 2022-01-26 DIAGNOSIS — Z794 Long term (current) use of insulin: Secondary | ICD-10-CM | POA: Insufficient documentation

## 2022-01-26 DIAGNOSIS — S46812A Strain of other muscles, fascia and tendons at shoulder and upper arm level, left arm, initial encounter: Secondary | ICD-10-CM | POA: Insufficient documentation

## 2022-01-26 DIAGNOSIS — S4992XA Unspecified injury of left shoulder and upper arm, initial encounter: Secondary | ICD-10-CM | POA: Diagnosis present

## 2022-01-26 MED ORDER — METHOCARBAMOL 500 MG PO TABS: 1000.0000 mg | ORAL_TABLET | Freq: Three times a day (TID) | ORAL | 0 refills | Status: AC | PRN

## 2022-01-26 MED ORDER — IBUPROFEN 600 MG PO TABS: 600.0000 mg | ORAL_TABLET | Freq: Three times a day (TID) | ORAL | 0 refills | Status: AC | PRN

## 2022-01-26 NOTE — ED Triage Notes (Signed)
Pt woke up 5 days ago with stiff neck on left side. Pt states pain has not improved in 5 days. No meds PTA

## 2022-01-26 NOTE — Discharge Instructions (Signed)
Thank you for letting us take care of you today.  Your x-ray was negative. Your exam was consistent with a muscle strain of your left trapezius muscle. I have prescribed muscle relaxants and NSAIDs for you to take to help with your symptoms.  Should your symptoms continue, please follow-up with your primary care provider or one of the clinics provided for re-evaluation. They may want to do further testing or imaging at that time or refer you to physical therapy.   If you develop fever, chills, vomiting, severe headache, weakness in your arms or legs, or other concerns, please be re-evaluated at the nearest emergency department.

## 2022-01-26 NOTE — ED Provider Notes (Signed)
[facility] EMERGENCY DEPARTMENT Provider Note   CSN: [id number] Arrival date & time: 01/26/22  1526     History {Add pertinent medical, surgical, social history, OB history to HPI:1} Chief Complaint  Patient presents with   Torticollis    Kathryn Crosby is a 62 y.o. female with PMH HTN, HLD, DM, arthritis, who presents to ED c/o left sided neck and shoulder pain x 5 days. Denies any known injury. Denies history of fall, neck trauma, or chronic neck pain. Denies numbness, tingling, weakness in the arms, headache, focal weakness, change in vision, fever, chills, chest pain, SOB, or other complaints. States pain has been uncontrolled with use of Tylenol at home.      Home Medications Prior to Admission medications   Medication Sig Start Date End Date Taking? Authorizing Provider  ACCU-CHEK AVIVA PLUS test strip CHECK BLOOD SUGAR TWICE DAILY 09/27/18   Milus Banister C, DO  ACCU-CHEK SOFTCLIX LANCETS lancets Use to check blood sugar twice daily.  Dx code: E11.9, E11.40 10/05/17   Milus Banister C, DO  Alcohol Swabs (B-D SINGLE USE SWABS REGULAR) PADS USE TWICE DAILY  OR AS DIRECTED 04/15/19   Milus Banister C, DO  amLODipine (NORVASC) 10 MG tablet TAKE 1 TABLET EVERY DAY 04/11/17   Smiley Houseman, MD  atorvastatin (LIPITOR) 40 MG tablet TAKE 1 TABLET AT BEDTIME 07/02/18   Milus Banister C, DO  B-D ULTRAFINE III SHORT PEN 31G X 8 MM MISC USE WITH INSULIN PEN 06/09/16   Mayo, Pete Pelt, MD  Blood Glucose Calibration (ACCU-CHEK AVIVA) SOLN Use as directed. 10/05/17   Daisy Floro, DO  Blood Glucose Monitoring Suppl (ACCU-CHEK AVIVA PLUS) w/Device KIT Test blood sugar twice daily as directed 04/23/18   Milus Banister C, DO  carvedilol (COREG) 25 MG tablet TAKE 1 TABLET TWICE DAILY  WITH  MEALS. 03/28/17   Smiley Houseman, MD  clindamycin (CLEOCIN) 300 MG capsule TAKE 1 CAPSULE BY MOUTH THREE TIMES DAILY 08/15/19   Shelly Bombard, MD  fluconazole (DIFLUCAN) 200 MG  tablet TAKE 1 TABLET EVERY 3 DAYS AS DIRECTED 08/26/21   Shelly Bombard, MD  insulin aspart (NOVOLOG FLEXPEN) 100 UNIT/ML FlexPen Inject 3 Units into the skin 3 (three) times daily with meals. 07/17/17   Smiley Houseman, MD  Insulin Glargine (LANTUS) 100 UNIT/ML Solostar Pen Inject 20 Units into the skin daily. 06/28/17   Smiley Houseman, MD  JARDIANCE 10 MG TABS tablet TAKE 1 TABLET EVERY DAY 08/31/18   Daisy Floro, DO  Lancet Device MISC To be used with accu-check lancets 06/12/15   Smiley Houseman, MD  Lancets Misc. (ACCU-CHEK FASTCLIX LANCET) KIT Please use to check blood sugar 06/03/15   Smiley Houseman, MD  losartan (COZAAR) 50 MG tablet TAKE 1 TABLET EVERY DAY 04/28/20   Milus Banister C, DO  metFORMIN (GLUCOPHAGE) 1000 MG tablet TAKE 1 TABLET TWICE DAILY WITH A MEAL 03/24/17   Smiley Houseman, MD  omeprazole (PRILOSEC) 20 MG capsule TAKE 1 CAPSULE EVERY DAY 07/25/17   Smiley Houseman, MD  Potassium Citrate 15 MEQ (1620 MG) TBCR Take 1 tablet by mouth 3 (three) times daily. 01/16/17   Smiley Houseman, MD  TRULICITY 1.5 FB/5.1WC SOPN INJECT  1.5MG  (0.5ML) SUBCUTANEOUSLY ONE TIME WEEKLY 07/23/18   Milus Banister C, DO      Allergies    Aspirin, Ivp dye [iodinated contrast media], Shellfish-derived products, and Lisinopril    Review of Systems  Review of Systems  Constitutional:  Negative for activity change, appetite change, chills and fever.  HENT:  Negative for ear pain and sore throat.   Eyes:  Negative for pain and visual disturbance.  Respiratory:  Negative for cough and shortness of breath.   Cardiovascular:  Negative for chest pain and palpitations.  Gastrointestinal:  Negative for abdominal pain and vomiting.  Genitourinary:  Negative for dysuria and hematuria.  Musculoskeletal:  Positive for neck pain. Negative for arthralgias, back pain, gait problem and neck stiffness.  Skin:  Negative for color change and rash.  Neurological:   Negative for dizziness, tremors, seizures, syncope, facial asymmetry, speech difficulty, weakness, light-headedness, numbness and headaches.  All other systems reviewed and are negative.   Physical Exam Updated Vital Signs BP 123/81 (BP Location: Right Arm)   Pulse 80   Temp 97.8 F (36.6 C)   Resp 20   Ht _0  (1.727 m)   Wt 119.7 kg   SpO2 99%   BMI 40.14 kg/m  Physical Exam Vitals and nursing note reviewed.  Constitutional:      General: She is not in acute distress.    Appearance: Normal appearance.  HENT:     Head: Normocephalic and atraumatic.     Mouth/Throat:     Mouth: Mucous membranes are moist.  Eyes:     Conjunctiva/sclera: Conjunctivae normal.  Neck:     Comments: No midline tenderness, tenderness over left trapezius muscle, full range of motion of neck including flexion, extension, and lateral rotation Cardiovascular:     Rate and Rhythm: Normal rate and regular rhythm.     Heart sounds: No murmur heard. Pulmonary:     Effort: Pulmonary effort is normal.     Breath sounds: Normal breath sounds.  Abdominal:     General: Abdomen is flat.     Palpations: Abdomen is soft.     Tenderness: There is no abdominal tenderness.  Musculoskeletal:        General: Normal range of motion.     Cervical back: Normal range of motion and neck supple. No rigidity.     Right lower leg: No edema.     Left lower leg: No edema.  Skin:    General: Skin is warm and dry.     Capillary Refill: Capillary refill takes less than 2 seconds.  Neurological:     General: No focal deficit present.     Mental Status: She is alert. Mental status is at baseline.     Cranial Nerves: No cranial nerve deficit.     Sensory: No sensory deficit.     Motor: No weakness.     Gait: Gait normal.  Psychiatric:        Behavior: Behavior normal.     ED Results / Procedures / Treatments   Labs (all labs ordered are listed, but only abnormal results are displayed) Labs Reviewed - No data to  display  EKG None  Radiology DG Cervical Spine Complete  Result Date: 01/26/2022 CLINICAL DATA:  Radicular symptoms EXAM: CERVICAL SPINE - COMPLETE 4+ VIEW COMPARISON:  None Available. FINDINGS: No fracture or subluxation. Anterior osteophytes noted the C4-5 through C6-7. Left neural foraminal narrowing at C5-6 due to uncovertebral spurring. Prevertebral soft tissues are normal. IMPRESSION: Degenerative changes.  No acute bony abnormality. Electronically Signed   By: Rolm Baptise M.D.   On: 01/26/2022 19:02    Procedures Procedures  {Document cardiac monitor, telemetry assessment procedure when appropriate:1}  Medications Ordered in ED  Medications - No data to display  ED Course/ Medical Decision Making/ A&P                           Medical Decision Making Amount and/or Complexity of Data Reviewed Radiology: ordered.   ***  {Document critical care time when appropriate:1} {Document review of labs and clinical decision tools ie heart score, Chads2Vasc2 etc:1}  {Document your independent review of radiology images, and any outside records:1} {Document your discussion with family members, caretakers, and with consultants:1} {Document social determinants of health affecting pt's care:1} {Document your decision making why or why not admission, treatments were needed:1} Final Clinical Impression(s) / ED Diagnoses Final diagnoses:  None    Rx / DC Orders ED Discharge Orders     None

## 2022-01-26 NOTE — ED Notes (Addendum)
Patient states she has left sided neck pain that radiates down her left arm. States she woke up 5 days ago and has had the pain ever since. She denied any chest pain or shortness of breath.

## 2022-09-28 ENCOUNTER — Other Ambulatory Visit: Payer: Self-pay | Admitting: Family Medicine

## 2022-09-28 DIAGNOSIS — Z1231 Encounter for screening mammogram for malignant neoplasm of breast: Secondary | ICD-10-CM

## 2022-11-07 ENCOUNTER — Ambulatory Visit: Payer: Medicare HMO

## 2022-11-11 ENCOUNTER — Ambulatory Visit: Payer: Medicare HMO

## 2022-11-18 ENCOUNTER — Ambulatory Visit
Admission: RE | Admit: 2022-11-18 | Discharge: 2022-11-18 | Disposition: A | Discharge status: Home / Self Care | Payer: Medicare HMO | Source: Ambulatory Visit | Attending: Family Medicine | Admitting: Family Medicine

## 2022-11-18 DIAGNOSIS — Z1231 Encounter for screening mammogram for malignant neoplasm of breast: Secondary | ICD-10-CM

## 2023-01-23 ENCOUNTER — Ambulatory Visit: Payer: Medicare HMO | Admitting: Obstetrics and Gynecology

## 2023-04-21 ENCOUNTER — Ambulatory Visit (INDEPENDENT_AMBULATORY_CARE_PROVIDER_SITE_OTHER): Payer: Medicare HMO | Admitting: Obstetrics

## 2023-04-21 ENCOUNTER — Other Ambulatory Visit (HOSPITAL_COMMUNITY)
Admission: RE | Admit: 2023-04-21 | Discharge: 2023-04-21 | Disposition: A | Discharge status: Home / Self Care | Source: Ambulatory Visit | Attending: Obstetrics | Admitting: Obstetrics

## 2023-04-21 ENCOUNTER — Encounter: Payer: Self-pay | Admitting: Obstetrics

## 2023-04-21 VITALS — BP 143/96 | HR 67 | Ht 68.0 in | Wt 266.0 lb

## 2023-04-21 DIAGNOSIS — B3731 Acute candidiasis of vulva and vagina: Secondary | ICD-10-CM | POA: Diagnosis present

## 2023-04-21 DIAGNOSIS — N898 Other specified noninflammatory disorders of vagina: Secondary | ICD-10-CM | POA: Diagnosis not present

## 2023-04-21 DIAGNOSIS — Z90710 Acquired absence of both cervix and uterus: Secondary | ICD-10-CM

## 2023-04-21 DIAGNOSIS — I10 Essential (primary) hypertension: Secondary | ICD-10-CM

## 2023-04-21 DIAGNOSIS — Z6841 Body Mass Index (BMI) 40.0 and over, adult: Secondary | ICD-10-CM

## 2023-04-21 DIAGNOSIS — N393 Stress incontinence (female) (male): Secondary | ICD-10-CM | POA: Diagnosis not present

## 2023-04-21 DIAGNOSIS — Z01419 Encounter for gynecological examination (general) (routine) without abnormal findings: Secondary | ICD-10-CM

## 2023-04-21 DIAGNOSIS — E139 Other specified diabetes mellitus without complications: Secondary | ICD-10-CM

## 2023-04-21 DIAGNOSIS — E66813 Obesity, class 3: Secondary | ICD-10-CM

## 2023-04-21 LAB — POCT URINALYSIS DIPSTICK
Bilirubin, UA: NEGATIVE
Glucose, UA: POSITIVE — AB
Nitrite, UA: NEGATIVE
Protein, UA: POSITIVE — AB
Spec Grav, UA: 1.01 (ref 1.010–1.025)
Urobilinogen, UA: 0.2 U/dL — AB
pH, UA: 6 (ref 5.0–8.0)

## 2023-04-21 MED ORDER — FLUCONAZOLE 200 MG PO TABS: 200.0000 mg | ORAL_TABLET | ORAL | 2 refills | Status: AC

## 2023-04-21 NOTE — Progress Notes (Signed)
 Asking for refill of Diflucan. C/O burning with urination. Thinks has yeast infection today.

## 2023-04-21 NOTE — Progress Notes (Signed)
 Patient ID: Kathryn Crosby, female   DOB: 1960-12-26, 63 y.o.   MRN: [id number]  Chief Complaint  Patient presents with   Gynecologic Exam    HPI Kathryn Crosby is a 63 y.o. female.  Complains of vaginal discharge with itching, and leaking of urine with cough, sneeze, laugh, etc.  She is s/p hysterectomy. HPI   Past Medical History:  Diagnosis Date   Arthritis    Chronic headaches    Complication of anesthesia    Diabetes mellitus    GERD (gastroesophageal reflux disease)    Hyperlipidemia    Hypertension    Kidney stones    Migraine    Nephrolithiasis    Varicose veins 03/30/2015   Bilateral Leg    Past Surgical History:  Procedure Laterality Date   ABDOMINAL HYSTERECTOMY     BREAST BIOPSY Bilateral    benign   left ankle fracture     LITHOTRIPSY     REPLACEMENT TOTAL KNEE Left    ROTATOR CUFF REPAIR     TONSILLECTOMY     WRIST SURGERY      Family History  Problem Relation Age of Onset   Diabetes Mother    Hypertension Mother    Kidney disease Mother    Varicose Veins Mother    Varicose Veins Father    Coronary artery disease Brother        CABG age 32   Diabetes Sister    Arthritis Sister     Social History Social History   Tobacco Use   Smoking status: Never   Smokeless tobacco: Never   Tobacco comments:    no plans to start  Vaping Use   Vaping status: Never Used  Substance Use Topics   Alcohol use: No    Alcohol/week: 0.0 standard drinks of alcohol   Drug use: No    Allergies  Allergen Reactions   Aspirin Hives and Shortness Of Breath   Ivp Dye [Iodinated Contrast Media] Hives and Shortness Of Breath   Shellfish-Derived Products Hives and Shortness Of Breath   Lisinopril Cough    Current Outpatient Medications  Medication Sig Dispense Refill   ACCU-CHEK AVIVA PLUS test strip CHECK BLOOD SUGAR TWICE DAILY 200 strip 3   ACCU-CHEK SOFTCLIX LANCETS lancets Use to check blood sugar twice daily.  Dx code: E11.9, E11.40 200 each 3    Alcohol Swabs (B-D SINGLE USE SWABS REGULAR) PADS USE TWICE DAILY  OR AS DIRECTED 200 each 6   amLODipine (NORVASC) 10 MG tablet TAKE 1 TABLET EVERY DAY 90 tablet 3   atorvastatin (LIPITOR) 40 MG tablet TAKE 1 TABLET AT BEDTIME 90 tablet 2   B-D ULTRAFINE III SHORT PEN 31G X 8 MM MISC USE WITH INSULIN PEN 180 each 5   Blood Glucose Calibration (ACCU-CHEK AVIVA) SOLN Use as directed. 3 each 0   Blood Glucose Monitoring Suppl (ACCU-CHEK AVIVA PLUS) w/Device KIT Test blood sugar twice daily as directed 1 kit 0   carvedilol (COREG) 25 MG tablet TAKE 1 TABLET TWICE DAILY  WITH  MEALS. 180 tablet 3   fluconazole (DIFLUCAN) 200 MG tablet Take 1 tablet (200 mg total) by mouth every 3 (three) days. 3 tablet 2   insulin aspart (NOVOLOG FLEXPEN) 100 UNIT/ML FlexPen Inject 3 Units into the skin 3 (three) times daily with meals. 15 mL 0   Insulin Glargine (LANTUS) 100 UNIT/ML Solostar Pen Inject 20 Units into the skin daily. 3 mL 6   JARDIANCE 10 MG TABS tablet TAKE 1  TABLET EVERY DAY 90 tablet 3   Lancet Device MISC To be used with accu-check lancets 1 each 0   Lancets Misc. (ACCU-CHEK FASTCLIX LANCET) KIT Please use to check blood sugar 1 kit 0   losartan (COZAAR) 50 MG tablet TAKE 1 TABLET EVERY DAY 90 tablet 2   metFORMIN (GLUCOPHAGE) 1000 MG tablet TAKE 1 TABLET TWICE DAILY WITH A MEAL 180 tablet 3   Potassium Citrate 15 MEQ (1620 MG) TBCR Take 1 tablet by mouth 3 (three) times daily. 270 tablet 0   TRULICITY 1.5 MG/0.5ML SOPN INJECT  1.5MG   (0.5ML) SUBCUTANEOUSLY ONE TIME WEEKLY 8 pen 6   clindamycin (CLEOCIN) 300 MG capsule TAKE 1 CAPSULE BY MOUTH THREE TIMES DAILY (Patient not taking: Reported on 04/21/2023) 14 capsule 0   fluconazole (DIFLUCAN) 200 MG tablet TAKE 1 TABLET EVERY 3 DAYS AS DIRECTED (Patient not taking: Reported on 04/21/2023) 3 tablet 4   omeprazole (PRILOSEC) 20 MG capsule TAKE 1 CAPSULE EVERY DAY (Patient not taking: Reported on 04/21/2023) 90 capsule 3   No current  facility-administered medications for this visit.    Review of Systems Review of Systems Constitutional: negative for fatigue and weight loss Respiratory: negative for cough and wheezing Cardiovascular: negative for chest pain, fatigue and palpitations Gastrointestinal: negative for abdominal pain and change in bowel habits Genitourinary:  positive for vaginal discharge with itching, and leaking of urine wit cough, sneeze, etc. Integument/breast: negative for nipple discharge Musculoskeletal:negative for myalgias Neurological: negative for gait problems and tremors Behavioral/Psych: negative for abusive relationship, depression Endocrine: negative for temperature intolerance      Blood pressure (!) 143/96, pulse 67, height 5\' 8"  (1.727 m), weight 266 lb (120.7 kg).  Physical Exam Physical Exam General:   Alert and no distress  Skin:   no rash or abnormalities  Lungs:   clear to auscultation bilaterally  Heart:   regular rate and rhythm, S1, S2 normal, no murmur, click, rub or gallop  Breasts:   normal without suspicious masses, skin or nipple changes or axillary nodes  Abdomen:  normal findings: no organomegaly, soft, non-tender and no hernia  Pelvis:  External genitalia: normal general appearance Urinary system: urethral meatus normal and bladder without fullness, nontender Vaginal: normal without tenderness, induration or masses Cervix:  absent Adnexa: normal bimanual exam Uterus: absent    I have spent a total of 30 minutes of face-to-face and non-face-to-face time, excluding clinical staff time, reviewing notes and preparing to see patient, ordering tests and/or medications, and counseling the patient.   Data Reviewed Wet Prep Urinalysis  Assessment     .1. Encounter for annual routine gynecological examination (Primary)  2. History of hysterectomy  3. SUI (stress urinary incontinence, female) Rx: - Ambulatory referral to Urogynecology - POCT Urinalysis  Dipstick  4. Vaginal discharge Rx: - Cervicovaginal ancillary only( )  5. Candida vaginitis, chronic Rx: - fluconazole (DIFLUCAN) 200 MG tablet; Take 1 tablet (200 mg total) by mouth every 3 (three) days.  Dispense: 3 tablet; Refill: 2  6. Diabetes 1.5, managed as type 2 (HCC) - poor glucose control - managed by PCP  7. HTN (hypertension), benign - poor BP control - managed by PCP  8. Class 3 severe obesity due to excess calories without serious comorbidity with body mass index (BMI) of 40.0 to 44.9 in adult (HCC) - weight reduction recommended with the aid of dietary changes, exercise and behavioral modification     Plan   Follow up prn  Orders Placed This  Encounter  Procedures   Ambulatory referral to Urogynecology    Referral Priority:   Routine    Referral Type:   Consultation    Referral Reason:   Specialty Services Required    Requested Specialty:   Urology    Number of Visits Requested:   1   POCT Urinalysis Dipstick   Meds ordered this encounter  Medications   fluconazole (DIFLUCAN) 200 MG tablet    Sig: Take 1 tablet (200 mg total) by mouth every 3 (three) days.    Dispense:  3 tablet    Refill:  2     Brock Bad, MD, FACOG Attending Obstetrician & Gynecologist, [facility] for [facility], [facility], [place] 04/21/2023

## 2023-04-24 LAB — CERVICOVAGINAL ANCILLARY ONLY
Bacterial Vaginitis (gardnerella): NEGATIVE
Candida Glabrata: POSITIVE — AB
Candida Vaginitis: POSITIVE — AB
Chlamydia: NEGATIVE
Comment: NEGATIVE
Comment: NEGATIVE
Comment: NEGATIVE
Comment: NEGATIVE
Comment: NEGATIVE
Comment: NORMAL
Neisseria Gonorrhea: NEGATIVE
Trichomonas: NEGATIVE

## 2023-04-26 ENCOUNTER — Other Ambulatory Visit: Payer: Self-pay | Admitting: Obstetrics

## 2023-04-26 DIAGNOSIS — B379 Candidiasis, unspecified: Secondary | ICD-10-CM

## 2023-04-26 MED ORDER — AZO BORIC ACID 600 MG VA SUPP: 1.0000 | Freq: Every day | VAGINAL | 0 refills | Status: AC

## 2023-09-19 ENCOUNTER — Ambulatory Visit: Admitting: Obstetrics and Gynecology

## 2023-10-06 ENCOUNTER — Ambulatory Visit: Admitting: Obstetrics

## 2023-10-26 ENCOUNTER — Other Ambulatory Visit: Payer: Self-pay | Admitting: Family Medicine

## 2023-10-26 ENCOUNTER — Other Ambulatory Visit: Payer: Self-pay | Admitting: Obstetrics

## 2023-10-26 DIAGNOSIS — Z1231 Encounter for screening mammogram for malignant neoplasm of breast: Secondary | ICD-10-CM

## 2023-10-26 DIAGNOSIS — B3731 Acute candidiasis of vulva and vagina: Secondary | ICD-10-CM

## 2023-11-20 ENCOUNTER — Ambulatory Visit
Admission: RE | Admit: 2023-11-20 | Discharge: 2023-11-20 | Disposition: A | Source: Ambulatory Visit | Attending: Family Medicine | Admitting: Family Medicine

## 2023-11-20 DIAGNOSIS — Z1231 Encounter for screening mammogram for malignant neoplasm of breast: Secondary | ICD-10-CM

## 2024-01-31 ENCOUNTER — Ambulatory Visit: Admitting: Obstetrics and Gynecology
